# Patient Record
Sex: Female | Born: 1945 | Race: Black or African American | Hispanic: No | Smoking: Former smoker
Health system: Southern US, Community
[De-identification: ages and names within clinical notes are randomized; demographics above are authoritative.]

## PROBLEM LIST (undated history)

## (undated) DIAGNOSIS — J189 Pneumonia, unspecified organism: Secondary | ICD-10-CM

## (undated) DIAGNOSIS — N179 Acute kidney failure, unspecified: Secondary | ICD-10-CM

## (undated) DIAGNOSIS — M069 Rheumatoid arthritis, unspecified: Secondary | ICD-10-CM

## (undated) DIAGNOSIS — D649 Anemia, unspecified: Secondary | ICD-10-CM

## (undated) DIAGNOSIS — J309 Allergic rhinitis, unspecified: Secondary | ICD-10-CM

## (undated) DIAGNOSIS — K219 Gastro-esophageal reflux disease without esophagitis: Secondary | ICD-10-CM

## (undated) DIAGNOSIS — R079 Chest pain, unspecified: Secondary | ICD-10-CM

## (undated) DIAGNOSIS — I2510 Atherosclerotic heart disease of native coronary artery without angina pectoris: Secondary | ICD-10-CM

## (undated) DIAGNOSIS — E785 Hyperlipidemia, unspecified: Secondary | ICD-10-CM

## (undated) DIAGNOSIS — I10 Essential (primary) hypertension: Secondary | ICD-10-CM

## (undated) DIAGNOSIS — R0902 Hypoxemia: Secondary | ICD-10-CM

## (undated) HISTORY — DX: Hypoxemia: R09.02

## (undated) HISTORY — PX: BUNIONECTOMY: SHX129

## (undated) HISTORY — DX: Essential (primary) hypertension: I10

## (undated) HISTORY — DX: Rheumatoid arthritis, unspecified: M06.9

## (undated) HISTORY — DX: Anemia, unspecified: D64.9

## (undated) HISTORY — DX: Allergic rhinitis, unspecified: J30.9

## (undated) HISTORY — DX: Hyperlipidemia, unspecified: E78.5

## (undated) HISTORY — DX: Chest pain, unspecified: R07.9

## (undated) HISTORY — DX: Acute kidney failure, unspecified: N17.9

## (undated) HISTORY — DX: Atherosclerotic heart disease of native coronary artery without angina pectoris: I25.10

## (undated) HISTORY — DX: Pneumonia, unspecified organism: J18.9

---

## 1983-02-20 HISTORY — PX: ABDOMINAL HYSTERECTOMY: SHX81

## 1997-08-05 ENCOUNTER — Other Ambulatory Visit: Admission: RE | Admit: 1997-08-05 | Discharge: 1997-08-05 | Payer: Self-pay | Admitting: Obstetrics and Gynecology

## 1998-06-22 ENCOUNTER — Encounter: Payer: Self-pay | Admitting: Family Medicine

## 1998-06-22 ENCOUNTER — Ambulatory Visit (HOSPITAL_COMMUNITY): Admission: RE | Admit: 1998-06-22 | Discharge: 1998-06-22 | Payer: Self-pay | Admitting: Family Medicine

## 1998-07-27 ENCOUNTER — Ambulatory Visit (HOSPITAL_COMMUNITY): Admission: RE | Admit: 1998-07-27 | Discharge: 1998-07-27 | Payer: Self-pay | Admitting: Gastroenterology

## 1998-07-27 ENCOUNTER — Encounter: Payer: Self-pay | Admitting: Gastroenterology

## 1998-08-15 ENCOUNTER — Other Ambulatory Visit: Admission: RE | Admit: 1998-08-15 | Discharge: 1998-08-15 | Payer: Self-pay | Admitting: Obstetrics and Gynecology

## 1999-09-01 ENCOUNTER — Encounter: Payer: Self-pay | Admitting: Family Medicine

## 1999-09-01 ENCOUNTER — Ambulatory Visit (HOSPITAL_COMMUNITY): Admission: RE | Admit: 1999-09-01 | Discharge: 1999-09-01 | Payer: Self-pay | Admitting: Family Medicine

## 1999-09-18 ENCOUNTER — Other Ambulatory Visit: Admission: RE | Admit: 1999-09-18 | Discharge: 1999-09-18 | Payer: Self-pay | Admitting: Obstetrics and Gynecology

## 1999-11-17 ENCOUNTER — Encounter: Payer: Self-pay | Admitting: Family Medicine

## 1999-11-17 ENCOUNTER — Encounter: Admission: RE | Admit: 1999-11-17 | Discharge: 1999-11-17 | Payer: Self-pay | Admitting: Family Medicine

## 2000-02-08 ENCOUNTER — Emergency Department (HOSPITAL_COMMUNITY): Admission: EM | Admit: 2000-02-08 | Discharge: 2000-02-08 | Payer: Self-pay | Admitting: Emergency Medicine

## 2000-03-25 ENCOUNTER — Ambulatory Visit (HOSPITAL_COMMUNITY): Admission: RE | Admit: 2000-03-25 | Discharge: 2000-03-25 | Payer: Self-pay | Admitting: *Deleted

## 2000-03-25 ENCOUNTER — Encounter: Payer: Self-pay | Admitting: *Deleted

## 2000-04-12 ENCOUNTER — Ambulatory Visit (HOSPITAL_COMMUNITY): Admission: RE | Admit: 2000-04-12 | Discharge: 2000-04-12 | Payer: Self-pay | Admitting: *Deleted

## 2000-10-07 ENCOUNTER — Other Ambulatory Visit: Admission: RE | Admit: 2000-10-07 | Discharge: 2000-10-07 | Payer: Self-pay | Admitting: Unknown Physician Specialty

## 2001-01-07 ENCOUNTER — Encounter: Admission: RE | Admit: 2001-01-07 | Discharge: 2001-03-04 | Payer: Self-pay | Admitting: Family Medicine

## 2002-12-11 ENCOUNTER — Encounter: Payer: Self-pay | Admitting: Family Medicine

## 2002-12-11 ENCOUNTER — Encounter: Admission: RE | Admit: 2002-12-11 | Discharge: 2002-12-11 | Payer: Self-pay | Admitting: Family Medicine

## 2002-12-15 ENCOUNTER — Encounter: Admission: RE | Admit: 2002-12-15 | Discharge: 2002-12-15 | Payer: Self-pay | Admitting: Family Medicine

## 2003-06-23 ENCOUNTER — Encounter: Admission: RE | Admit: 2003-06-23 | Discharge: 2003-06-23 | Payer: Self-pay | Admitting: Family Medicine

## 2003-12-19 ENCOUNTER — Emergency Department (HOSPITAL_COMMUNITY): Admission: EM | Admit: 2003-12-19 | Discharge: 2003-12-19 | Payer: Self-pay | Admitting: Emergency Medicine

## 2007-08-01 ENCOUNTER — Encounter: Admission: RE | Admit: 2007-08-01 | Discharge: 2007-08-01 | Payer: Self-pay | Admitting: Family Medicine

## 2009-02-22 ENCOUNTER — Encounter: Admission: RE | Admit: 2009-02-22 | Discharge: 2009-02-22 | Payer: Self-pay | Admitting: Family Medicine

## 2010-07-07 ENCOUNTER — Encounter: Payer: Self-pay | Admitting: Oncology

## 2010-07-26 ENCOUNTER — Other Ambulatory Visit: Payer: Self-pay | Admitting: Oncology

## 2010-07-26 ENCOUNTER — Encounter (HOSPITAL_BASED_OUTPATIENT_CLINIC_OR_DEPARTMENT_OTHER): Payer: Federal, State, Local not specified - PPO | Admitting: Oncology

## 2010-07-26 DIAGNOSIS — D649 Anemia, unspecified: Secondary | ICD-10-CM

## 2010-07-26 LAB — CBC & DIFF AND RETIC
BASO%: 0.4 % (ref 0.0–2.0)
Basophils Absolute: 0 10*3/uL (ref 0.0–0.1)
EOS%: 10.5 % — ABNORMAL HIGH (ref 0.0–7.0)
Eosinophils Absolute: 0.6 10*3/uL — ABNORMAL HIGH (ref 0.0–0.5)
HCT: 33.2 % — ABNORMAL LOW (ref 34.8–46.6)
HGB: 10.6 g/dL — ABNORMAL LOW (ref 11.6–15.9)
Immature Retic Fract: 3.9 % (ref 0.00–10.70)
LYMPH%: 29.3 % (ref 14.0–49.7)
MCH: 26.2 pg (ref 25.1–34.0)
MCHC: 31.9 g/dL (ref 31.5–36.0)
MCV: 82 fL (ref 79.5–101.0)
MONO#: 0.4 10*3/uL (ref 0.1–0.9)
MONO%: 6.9 % (ref 0.0–14.0)
NEUT#: 3 10*3/uL (ref 1.5–6.5)
NEUT%: 52.9 % (ref 38.4–76.8)
Platelets: 384 10*3/uL (ref 145–400)
RBC: 4.05 10*6/uL (ref 3.70–5.45)
RDW: 14.6 % — ABNORMAL HIGH (ref 11.2–14.5)
Retic %: 1.25 % (ref 0.50–1.50)
Retic Ct Abs: 50.63 10*3/uL (ref 18.30–72.70)
WBC: 5.6 10*3/uL (ref 3.9–10.3)
lymph#: 1.7 10*3/uL (ref 0.9–3.3)

## 2010-07-26 LAB — MORPHOLOGY: PLT EST: ADEQUATE

## 2010-07-26 LAB — CHCC SMEAR

## 2010-07-28 LAB — PROTEIN ELECTROPHORESIS, SERUM
Albumin ELP: 57.4 % (ref 55.8–66.1)
Alpha-1-Globulin: 5.4 % — ABNORMAL HIGH (ref 2.9–4.9)
Alpha-2-Globulin: 12.3 % — ABNORMAL HIGH (ref 7.1–11.8)
Beta 2: 5.2 % (ref 3.2–6.5)
Beta Globulin: 5.8 % (ref 4.7–7.2)
Gamma Globulin: 13.9 % (ref 11.1–18.8)
Total Protein, Serum Electrophoresis: 6.8 g/dL (ref 6.0–8.3)

## 2010-07-28 LAB — COMPREHENSIVE METABOLIC PANEL
ALT: <8 U/L (ref 0–35)
AST: 18 U/L (ref 0–37)
Albumin: 4.2 g/dL (ref 3.5–5.2)
Alkaline Phosphatase: 78 U/L (ref 39–117)
BUN: 13 mg/dL (ref 6–23)
CO2: 31 mEq/L (ref 19–32)
Calcium: 9.3 mg/dL (ref 8.4–10.5)
Chloride: 102 mEq/L (ref 96–112)
Creatinine, Ser: 0.9 mg/dL (ref 0.50–1.10)
Glucose, Bld: 67 mg/dL — ABNORMAL LOW (ref 70–99)
Potassium: 4 mEq/L (ref 3.5–5.3)
Sodium: 141 mEq/L (ref 135–145)
Total Bilirubin: 0.4 mg/dL (ref 0.3–1.2)
Total Protein: 6.8 g/dL (ref 6.0–8.3)

## 2010-07-28 LAB — KAPPA/LAMBDA LIGHT CHAINS
Kappa free light chain: 1.3 mg/dL (ref 0.33–1.94)
Kappa:Lambda Ratio: 0.79 (ref 0.26–1.65)
Lambda Free Lght Chn: 1.65 mg/dL (ref 0.57–2.63)

## 2010-07-28 LAB — LACTATE DEHYDROGENASE: LDH: 216 U/L (ref 94–250)

## 2010-07-28 LAB — ERYTHROPOIETIN: Erythropoietin: 27.1 m[IU]/mL (ref 2.6–34.0)

## 2010-07-28 LAB — VITAMIN B12: Vitamin B-12: 346 pg/mL (ref 211–911)

## 2010-07-28 LAB — FOLATE: Folate: 5.2 ng/mL

## 2010-10-19 ENCOUNTER — Other Ambulatory Visit: Payer: Self-pay | Admitting: Oncology

## 2010-10-19 ENCOUNTER — Encounter (HOSPITAL_BASED_OUTPATIENT_CLINIC_OR_DEPARTMENT_OTHER): Payer: Federal, State, Local not specified - PPO | Admitting: Oncology

## 2010-10-19 DIAGNOSIS — D649 Anemia, unspecified: Secondary | ICD-10-CM

## 2010-10-19 LAB — CBC WITH DIFFERENTIAL/PLATELET
BASO%: 0 % (ref 0.0–2.0)
Basophils Absolute: 0 10*3/uL (ref 0.0–0.1)
EOS%: 10.4 % — ABNORMAL HIGH (ref 0.0–7.0)
Eosinophils Absolute: 0.6 10*3/uL — ABNORMAL HIGH (ref 0.0–0.5)
HCT: 32.5 % — ABNORMAL LOW (ref 34.8–46.6)
HGB: 10.4 g/dL — ABNORMAL LOW (ref 11.6–15.9)
LYMPH%: 33.3 % (ref 14.0–49.7)
MCH: 27 pg (ref 25.1–34.0)
MCHC: 32 g/dL (ref 31.5–36.0)
MCV: 84.4 fL (ref 79.5–101.0)
MONO#: 0.5 10*3/uL (ref 0.1–0.9)
MONO%: 9.6 % (ref 0.0–14.0)
NEUT#: 2.7 10*3/uL (ref 1.5–6.5)
NEUT%: 46.7 % (ref 38.4–76.8)
Platelets: 374 10*3/uL (ref 145–400)
RBC: 3.85 10*6/uL (ref 3.70–5.45)
RDW: 16.3 % — ABNORMAL HIGH (ref 11.2–14.5)
WBC: 5.7 10*3/uL (ref 3.9–10.3)
lymph#: 1.9 10*3/uL (ref 0.9–3.3)

## 2011-01-14 ENCOUNTER — Encounter: Payer: Self-pay | Admitting: Oncology

## 2011-01-14 DIAGNOSIS — E785 Hyperlipidemia, unspecified: Secondary | ICD-10-CM | POA: Insufficient documentation

## 2011-01-14 DIAGNOSIS — M069 Rheumatoid arthritis, unspecified: Secondary | ICD-10-CM | POA: Insufficient documentation

## 2011-01-14 DIAGNOSIS — I10 Essential (primary) hypertension: Secondary | ICD-10-CM | POA: Insufficient documentation

## 2011-01-14 DIAGNOSIS — D649 Anemia, unspecified: Secondary | ICD-10-CM | POA: Insufficient documentation

## 2011-01-15 ENCOUNTER — Encounter: Payer: Self-pay | Admitting: *Deleted

## 2011-01-18 ENCOUNTER — Ambulatory Visit (HOSPITAL_BASED_OUTPATIENT_CLINIC_OR_DEPARTMENT_OTHER): Payer: Federal, State, Local not specified - PPO | Admitting: Oncology

## 2011-01-18 ENCOUNTER — Other Ambulatory Visit (HOSPITAL_BASED_OUTPATIENT_CLINIC_OR_DEPARTMENT_OTHER): Payer: Federal, State, Local not specified - PPO | Admitting: Lab

## 2011-01-18 ENCOUNTER — Other Ambulatory Visit: Payer: Self-pay | Admitting: Oncology

## 2011-01-18 ENCOUNTER — Telehealth: Payer: Self-pay | Admitting: Oncology

## 2011-01-18 DIAGNOSIS — D649 Anemia, unspecified: Secondary | ICD-10-CM

## 2011-01-18 DIAGNOSIS — I10 Essential (primary) hypertension: Secondary | ICD-10-CM

## 2011-01-18 DIAGNOSIS — K219 Gastro-esophageal reflux disease without esophagitis: Secondary | ICD-10-CM

## 2011-01-18 DIAGNOSIS — M069 Rheumatoid arthritis, unspecified: Secondary | ICD-10-CM

## 2011-01-18 DIAGNOSIS — E785 Hyperlipidemia, unspecified: Secondary | ICD-10-CM

## 2011-01-18 LAB — IRON AND TIBC
%SAT: 24 % (ref 20–55)
Iron: 74 ug/dL (ref 42–145)
TIBC: 304 ug/dL (ref 250–470)
UIBC: 230 ug/dL (ref 125–400)

## 2011-01-18 LAB — CBC WITH DIFFERENTIAL/PLATELET
BASO%: 0.2 % (ref 0.0–2.0)
Basophils Absolute: 0 10*3/uL (ref 0.0–0.1)
EOS%: 10.9 % — ABNORMAL HIGH (ref 0.0–7.0)
Eosinophils Absolute: 0.6 10*3/uL — ABNORMAL HIGH (ref 0.0–0.5)
HCT: 33.4 % — ABNORMAL LOW (ref 34.8–46.6)
HGB: 10.6 g/dL — ABNORMAL LOW (ref 11.6–15.9)
LYMPH%: 34.7 % (ref 14.0–49.7)
MCH: 25.7 pg (ref 25.1–34.0)
MCHC: 31.7 g/dL (ref 31.5–36.0)
MCV: 80.9 fL (ref 79.5–101.0)
MONO#: 0.5 10*3/uL (ref 0.1–0.9)
MONO%: 9.8 % (ref 0.0–14.0)
NEUT#: 2.4 10*3/uL (ref 1.5–6.5)
NEUT%: 44.4 % (ref 38.4–76.8)
Platelets: 374 10*3/uL (ref 145–400)
RBC: 4.13 10*6/uL (ref 3.70–5.45)
RDW: 15.2 % — ABNORMAL HIGH (ref 11.2–14.5)
WBC: 5.3 10*3/uL (ref 3.9–10.3)
lymph#: 1.8 10*3/uL (ref 0.9–3.3)
nRBC: 0 % (ref 0–0)

## 2011-01-18 LAB — COMPREHENSIVE METABOLIC PANEL
ALT: <8 U/L (ref 0–35)
AST: 18 U/L (ref 0–37)
Albumin: 4.1 g/dL (ref 3.5–5.2)
Alkaline Phosphatase: 87 U/L (ref 39–117)
BUN: 11 mg/dL (ref 6–23)
CO2: 27 mEq/L (ref 19–32)
Calcium: 9.4 mg/dL (ref 8.4–10.5)
Chloride: 103 mEq/L (ref 96–112)
Creatinine, Ser: 0.97 mg/dL (ref 0.50–1.10)
Glucose, Bld: 86 mg/dL (ref 70–99)
Potassium: 4.4 mEq/L (ref 3.5–5.3)
Sodium: 140 mEq/L (ref 135–145)
Total Bilirubin: 0.3 mg/dL (ref 0.3–1.2)
Total Protein: 6.7 g/dL (ref 6.0–8.3)

## 2011-01-18 LAB — FERRITIN: Ferritin: 51 ng/mL (ref 10–291)

## 2011-01-18 NOTE — Telephone Encounter (Signed)
gve the pt her march,oct 2013 appt calendars

## 2011-01-18 NOTE — Progress Notes (Signed)
[facility] OFFICE PROGRESS NOTE  DIAGNOSIS:  Chronic normocytic anemia; most likely due to chronic inflammation from rheumatoid arthritis and leflunomide.  CURRENT THERAPY: watchful observation.  INTERVAL HISTORY: Cheyenne Gould 65 y.o. female returns for regular follow up.  She is still working full time as a Runner, broadcasting/film/video.  She notices that she has been having more frequent flare of RA where she has exacerbation about 1-2x/month now.  She is happy with leflunomide and does not want anything stronger.  She denies visible source of bleeding such as epistaxis, gum bleed, hematemasis, hemoptysis, melena, hematochezia, vaginal bleeding.  She denies fatigue, anorexia, weight loss, night sweat, node swelling.  Patient denies fatigue, headache, visual changes, confusion, drenching night sweats, palpable lymph node swelling, mucositis, odynophagia, dysphagia, nausea vomiting, jaundice, chest pain, palpitation, shortness of breath, dyspnea on exertion, productive cough, gum bleeding, epistaxis, hematemesis, hemoptysis, abdominal pain, abdominal swelling, early satiety, melena, hematochezia, hematuria, skin rash, spontaneous bleeding, heat or cold intolerance, bowel bladder incontinence, back pain, focal motor weakness, paresthesia, depression, suicidal or homocidal ideation, feeling hopelessness.   MEDICAL HISTORY: Past Medical History  Diagnosis Date  . Rheumatoid arthritis     on Leflunomide  . Hypertension   . Hyperlipidemia   . Allergic sinusitis   . Anemia     SURGICAL HISTORY: No past surgical history on file.  MEDICATIONS: Current Outpatient Prescriptions  Medication Sig Dispense Refill  . acetaminophen (TYLENOL) 500 MG tablet Take 500 mg by mouth every 6 (six) hours as needed.        Marland Kitchen amLODipine-valsartan (EXFORGE) 5-160 MG per tablet Take 1 tablet by mouth daily.        . clindamycin (CLEOCIN) 300 MG capsule 300 mg. Prior to dental work      . fexofenadine (ALLEGRA) 180 MG  tablet Take 180 mg by mouth daily as needed.        . leflunomide (ARAVA) 20 MG tablet Take 20 mg by mouth daily.        Marland Kitchen NEXIUM 40 MG capsule Take 40 mg by mouth 2 (two) times daily.       Marland Kitchen VITAMIN D, ERGOCALCIFEROL, PO Take by mouth.          ALLERGIES:  is allergic to peanut-containing drug products; penicillins; shellfish allergy; and sulfa antibiotics.  REVIEW OF SYSTEMS:  The rest of the 14-point review of system was negative.   Filed Vitals:   01/18/11 0901  BP: 128/79  Pulse: 75  Temp: 97.4 F (36.3 C)   Wt Readings from Last 3 Encounters:  01/18/11 168 lb 6.4 oz (76.386 kg)  07/26/10 162 lb 8 oz (73.71 kg)   ECOG Performance status: 0-1  PHYSICAL EXAMINATION:   General:  well-nourished in no acute distress.  Eyes:  no scleral icterus.  ENT:  There were no oropharyngeal lesions.  Neck was without thyromegaly.  Lymphatics:  Negative cervical, supraclavicular or axillary adenopathy.  Respiratory: lungs were clear bilaterally without wheezing or crackles.  Cardiovascular:  Regular rate and rhythm, S1/S2, without murmur, rub or gallop.  There was no pedal edema.  GI:  abdomen was soft, flat, nontender, nondistended, without organomegaly.  Muscoloskeletal: very mild ulnar deviation of bilateral hands without nodules or pain on palpation.   Skin exam was without echymosis, petichae.  Neuro exam was nonfocal.  Patient was able to get on and off exam table without assistance.  Gait was normal.  Patient was alerted and oriented.  Attention was good.   Language was  appropriate.  Mood was normal without depression.  Speech was not pressured.  Thought content was not tangential.    LABORATORY/RADIOLOGY DATA: WBC 5.3; Hgb 10.6; Plt 374.    ASSESSMENT AND PLAN:   1.  RA:  Slightly increase in frequency of exacerbation.  She is happy with leflunomide.  2.  Normocytic anemia:  Most likely due to her RA and leflunomide.  Her Hbg is stable.  There is no active bleeding or symptomatic  anemia.  There is no need for transfusion.  Since her anemia is very mild, no invasive testing is indicated such as bone marrow biopsy unless her Hgb significantly worsens in the future.  3.  GERD:  Controlled with Nexium.  She has had negative EGD with Dr. Elnoria Howard.  4.  HTN:  Well controlled on Amlodipine/Valsartan.  5.  Follow up:  CBC in 4 months; lab/RV with me in 8 months. 6.  Age-appropriate cancer screening:  Her colonoscopy and mammogram are due about now.  She prefers to [facility] Dr[facility] for [facility].

## 2011-01-25 ENCOUNTER — Other Ambulatory Visit: Payer: Federal, State, Local not specified - PPO | Admitting: Lab

## 2011-05-18 ENCOUNTER — Other Ambulatory Visit (HOSPITAL_BASED_OUTPATIENT_CLINIC_OR_DEPARTMENT_OTHER): Payer: Federal, State, Local not specified - PPO | Admitting: Lab

## 2011-05-18 DIAGNOSIS — D649 Anemia, unspecified: Secondary | ICD-10-CM

## 2011-05-18 LAB — CBC WITH DIFFERENTIAL/PLATELET
BASO%: 0.8 % (ref 0.0–2.0)
Basophils Absolute: 0.1 10*3/uL (ref 0.0–0.1)
EOS%: 6.2 % (ref 0.0–7.0)
Eosinophils Absolute: 0.5 10*3/uL (ref 0.0–0.5)
HCT: 32.8 % — ABNORMAL LOW (ref 34.8–46.6)
HGB: 10.5 g/dL — ABNORMAL LOW (ref 11.6–15.9)
LYMPH%: 16.7 % (ref 14.0–49.7)
MCH: 27.4 pg (ref 25.1–34.0)
MCHC: 32.2 g/dL (ref 31.5–36.0)
MCV: 85.1 fL (ref 79.5–101.0)
MONO#: 0.7 10*3/uL (ref 0.1–0.9)
MONO%: 9.1 % (ref 0.0–14.0)
NEUT#: 5.5 10*3/uL (ref 1.5–6.5)
NEUT%: 67.2 % (ref 38.4–76.8)
Platelets: 343 10*3/uL (ref 145–400)
RBC: 3.85 10*6/uL (ref 3.70–5.45)
RDW: 15.7 % — ABNORMAL HIGH (ref 11.2–14.5)
WBC: 8.2 10*3/uL (ref 3.9–10.3)
lymph#: 1.4 10*3/uL (ref 0.9–3.3)
nRBC: 0 % (ref 0–0)

## 2011-05-22 ENCOUNTER — Telehealth: Payer: Self-pay

## 2011-05-22 NOTE — Telephone Encounter (Signed)
Message copied by Kallie Locks on Tue May 22, 2011  3:53 PM ------      Message from: Jethro Bolus T      Created: Fri May 18, 2011  9:47 AM       Please call pt.  Her hgb has been stable.  Continue observation.  Thanks.

## 2011-07-26 ENCOUNTER — Ambulatory Visit: Payer: Federal, State, Local not specified - PPO | Admitting: Oncology

## 2011-07-26 ENCOUNTER — Other Ambulatory Visit: Payer: Federal, State, Local not specified - PPO | Admitting: Lab

## 2011-12-05 ENCOUNTER — Telehealth: Payer: Self-pay | Admitting: Oncology

## 2011-12-05 ENCOUNTER — Other Ambulatory Visit (HOSPITAL_BASED_OUTPATIENT_CLINIC_OR_DEPARTMENT_OTHER): Payer: Federal, State, Local not specified - PPO | Admitting: Lab

## 2011-12-05 ENCOUNTER — Ambulatory Visit (HOSPITAL_BASED_OUTPATIENT_CLINIC_OR_DEPARTMENT_OTHER): Payer: Federal, State, Local not specified - PPO | Admitting: Oncology

## 2011-12-05 ENCOUNTER — Ambulatory Visit: Payer: Federal, State, Local not specified - PPO | Admitting: Oncology

## 2011-12-05 ENCOUNTER — Encounter: Payer: Self-pay | Admitting: Oncology

## 2011-12-05 ENCOUNTER — Other Ambulatory Visit: Payer: Federal, State, Local not specified - PPO | Admitting: Lab

## 2011-12-05 VITALS — BP 126/79 | HR 82 | Temp 97.6°F | Resp 20 | Ht 65.5 in | Wt 167.9 lb

## 2011-12-05 DIAGNOSIS — M069 Rheumatoid arthritis, unspecified: Secondary | ICD-10-CM

## 2011-12-05 DIAGNOSIS — D649 Anemia, unspecified: Secondary | ICD-10-CM

## 2011-12-05 DIAGNOSIS — I10 Essential (primary) hypertension: Secondary | ICD-10-CM

## 2011-12-05 LAB — CBC WITH DIFFERENTIAL/PLATELET
BASO%: 0.7 % (ref 0.0–2.0)
Basophils Absolute: 0 10*3/uL (ref 0.0–0.1)
EOS%: 10.8 % — ABNORMAL HIGH (ref 0.0–7.0)
Eosinophils Absolute: 0.6 10*3/uL — ABNORMAL HIGH (ref 0.0–0.5)
HCT: 31.1 % — ABNORMAL LOW (ref 34.8–46.6)
HGB: 9.8 g/dL — ABNORMAL LOW (ref 11.6–15.9)
LYMPH%: 25.8 % (ref 14.0–49.7)
MCH: 26.5 pg (ref 25.1–34.0)
MCHC: 31.6 g/dL (ref 31.5–36.0)
MCV: 83.7 fL (ref 79.5–101.0)
MONO#: 0.5 10*3/uL (ref 0.1–0.9)
MONO%: 9 % (ref 0.0–14.0)
NEUT#: 3 10*3/uL (ref 1.5–6.5)
NEUT%: 53.7 % (ref 38.4–76.8)
Platelets: 324 10*3/uL (ref 145–400)
RBC: 3.72 10*6/uL (ref 3.70–5.45)
RDW: 16 % — ABNORMAL HIGH (ref 11.2–14.5)
WBC: 5.5 10*3/uL (ref 3.9–10.3)
lymph#: 1.4 10*3/uL (ref 0.9–3.3)

## 2011-12-05 LAB — COMPREHENSIVE METABOLIC PANEL (CC13)
ALT: 7 U/L (ref 0–55)
AST: 18 U/L (ref 5–34)
Albumin: 3.4 g/dL — ABNORMAL LOW (ref 3.5–5.0)
Alkaline Phosphatase: 88 U/L (ref 40–150)
BUN: 10 mg/dL (ref 7.0–26.0)
CO2: 26 mEq/L (ref 22–29)
Calcium: 9.6 mg/dL (ref 8.4–10.4)
Chloride: 104 mEq/L (ref 98–107)
Creatinine: 0.9 mg/dL (ref 0.6–1.1)
Glucose: 89 mg/dl (ref 70–99)
Potassium: 3.9 mEq/L (ref 3.5–5.1)
Sodium: 140 mEq/L (ref 136–145)
Total Bilirubin: 0.4 mg/dL (ref 0.20–1.20)
Total Protein: 6.4 g/dL (ref 6.4–8.3)

## 2011-12-05 LAB — IRON AND TIBC
%SAT: 17 % — ABNORMAL LOW (ref 20–55)
Iron: 45 ug/dL (ref 42–145)
TIBC: 271 ug/dL (ref 250–470)
UIBC: 226 ug/dL (ref 125–400)

## 2011-12-05 LAB — FERRITIN: Ferritin: 54 ng/mL (ref 10–291)

## 2011-12-05 NOTE — Progress Notes (Signed)
[facility]  Telephone:([phone]) [phone] [phone]   OFFICE PROGRESS NOTE   Cc:  Geraldo Pitter, MD  DIAGNOSIS: Chronic normocytic anemia; most likely due to chronic inflammation from rheumatoid arthritis and leflunomide.   CURRENT THERAPY: Watchful observation   INTERVAL HISTORY: Cheyenne Gould 66 y.o. female returns for regular follow up. She is still working full time as a Runner, broadcasting/film/video. She notices that she has been having more frequent flare of RA where she has exacerbation about 1-2x/month now. She is happy with leflunomide and does not want anything stronger. She denies visible source of bleeding such as epistaxis, gum bleed, hematemasis, hemoptysis, melena, hematochezia, vaginal bleeding. She denies fatigue, anorexia, weight loss, night sweat, node swelling.   Patient denies fatigue, headache, visual changes, confusion, drenching night sweats, palpable lymph node swelling, mucositis, odynophagia, dysphagia, nausea vomiting, jaundice, chest pain, palpitation, shortness of breath, dyspnea on exertion, productive cough, gum bleeding, epistaxis, hematemesis, hemoptysis, abdominal pain, abdominal swelling, early satiety, melena, hematochezia, hematuria, skin rash, spontaneous bleeding, heat or cold intolerance, bowel bladder incontinence, back pain, focal motor weakness, paresthesia, depression, suicidal or homocidal ideation, feeling hopelessness.   Past Medical History  Diagnosis Date  . Rheumatoid arthritis     on Leflunomide  . Hypertension   . Hyperlipidemia   . Allergic sinusitis   . Anemia     History reviewed. No pertinent past surgical history.  Current Outpatient Prescriptions  Medication Sig Dispense Refill  . acetaminophen (TYLENOL) 500 MG tablet Take 500 mg by mouth every 6 (six) hours as needed.        . clindamycin (CLEOCIN) 300 MG capsule 300 mg. Prior to dental work      . fexofenadine (ALLEGRA) 180 MG tablet Take 180 mg by mouth daily as  needed.        . leflunomide (ARAVA) 20 MG tablet Take 20 mg by mouth daily.        Marland Kitchen NEXIUM 40 MG capsule Take 40 mg by mouth 2 (two) times daily.       Marland Kitchen VITAMIN D, ERGOCALCIFEROL, PO Take by mouth.        Carlene Coria HCT 5-160-12.5 MG TABS         ALLERGIES:  is allergic to peanut-containing drug products; penicillins; shellfish allergy; and sulfa antibiotics.  REVIEW OF SYSTEMS:  The rest of the 14-point review of system was negative.   Filed Vitals:   12/05/11 0857  BP: 126/79  Pulse: 82  Temp: 97.6 F (36.4 C)  Resp: 20   Wt Readings from Last 3 Encounters:  12/05/11 167 lb 14.4 oz (76.159 kg)  01/18/11 168 lb 6.4 oz (76.386 kg)  07/26/10 162 lb 8 oz (73.71 kg)   ECOG Performance status: 0-1  PHYSICAL EXAMINATION:   General: well-nourished in no acute distress. Eyes: no scleral icterus. ENT: There were no oropharyngeal lesions. Neck was without thyromegaly. Lymphatics: Negative cervical, supraclavicular or axillary adenopathy. Respiratory: lungs were clear bilaterally without wheezing or crackles. Cardiovascular: Regular rate and rhythm, S1/S2, without murmur, rub or gallop. There was no pedal edema. GI: abdomen was soft, flat, nontender, nondistended, without organomegaly. Muscoloskeletal: very mild ulnar deviation of bilateral hands without nodules or pain on palpation. Skin exam was without echymosis, petichae. Neuro exam was nonfocal. Patient was able to get on and off exam table without assistance. Gait was normal. Patient was alerted and oriented. Attention was good. Language was appropriate. Mood was normal without depression. Speech was not pressured. Thought content was  not tangential.   LABORATORY/RADIOLOGY DATA:  Lab Results  Component Value Date   WBC 5.5 12/05/2011   HGB 9.8* 12/05/2011   HCT 31.1* 12/05/2011   PLT 324 12/05/2011   GLUCOSE 89 12/05/2011   ALKPHOS 88 12/05/2011   ALT 7 12/05/2011   AST 18 12/05/2011   NA 140 12/05/2011   K 3.9 12/05/2011    CL 104 12/05/2011   CREATININE 0.9 12/05/2011   BUN 10.0 12/05/2011   CO2 26 12/05/2011    ASSESSMENT AND PLAN:   1. RA: Slightly increase in frequency of exacerbation. She is happy with leflunomide. She will continue follow-up with Dr Dierdre Forth.  2. Normocytic anemia: Most likely due to her RA and leflunomide. Her Hbg is stable. There is no active bleeding or symptomatic anemia. There is no need for transfusion. Since her anemia is very mild, no invasive testing is indicated such as bone marrow biopsy unless her Hgb significantly worsens in the future.   3. GERD: Controlled with Nexium. She has had negative EGD with Dr. Elnoria Howard.   4. HTN: Well controlled on Amlodipine/Valsartan.   5. Follow up: CBC in 4 and 8 months. Lab and visit in 1 year.   6. Age-appropriate cancer screening: Flu vaccine is scheduled for 12/14/11. Mammogram has been done within the past. Year. She still needs follow-up with Dr Elnoria Howard for a colonoscopy. She prefers to call his office and schedule with him.     The length of time of the face-to-face encounter was 15 minutes. More than 50% of time was spent counseling and coordination of care.

## 2011-12-05 NOTE — Telephone Encounter (Signed)
s.w. pt and advised on Feb, June, and OCT appts

## 2011-12-26 ENCOUNTER — Other Ambulatory Visit: Payer: Federal, State, Local not specified - PPO | Admitting: Lab

## 2012-02-25 DIAGNOSIS — J3489 Other specified disorders of nose and nasal sinuses: Secondary | ICD-10-CM | POA: Diagnosis not present

## 2012-02-25 DIAGNOSIS — J329 Chronic sinusitis, unspecified: Secondary | ICD-10-CM | POA: Diagnosis not present

## 2012-02-26 ENCOUNTER — Other Ambulatory Visit: Payer: Self-pay | Admitting: Otolaryngology

## 2012-02-26 DIAGNOSIS — J329 Chronic sinusitis, unspecified: Secondary | ICD-10-CM

## 2012-02-26 DIAGNOSIS — J3489 Other specified disorders of nose and nasal sinuses: Secondary | ICD-10-CM

## 2012-02-28 ENCOUNTER — Other Ambulatory Visit: Payer: Federal, State, Local not specified - PPO

## 2012-03-11 DIAGNOSIS — I10 Essential (primary) hypertension: Secondary | ICD-10-CM | POA: Diagnosis not present

## 2012-03-17 DIAGNOSIS — I10 Essential (primary) hypertension: Secondary | ICD-10-CM | POA: Diagnosis not present

## 2012-03-17 DIAGNOSIS — J069 Acute upper respiratory infection, unspecified: Secondary | ICD-10-CM | POA: Diagnosis not present

## 2012-03-19 ENCOUNTER — Ambulatory Visit
Admission: RE | Admit: 2012-03-19 | Discharge: 2012-03-19 | Disposition: A | Discharge status: Home / Self Care | Payer: Federal, State, Local not specified - PPO | Source: Ambulatory Visit | Attending: Otolaryngology | Admitting: Otolaryngology

## 2012-03-19 DIAGNOSIS — J3489 Other specified disorders of nose and nasal sinuses: Secondary | ICD-10-CM

## 2012-03-19 DIAGNOSIS — J329 Chronic sinusitis, unspecified: Secondary | ICD-10-CM

## 2012-03-27 ENCOUNTER — Other Ambulatory Visit: Payer: Federal, State, Local not specified - PPO | Admitting: Lab

## 2012-03-27 DIAGNOSIS — J329 Chronic sinusitis, unspecified: Secondary | ICD-10-CM | POA: Diagnosis not present

## 2012-03-27 DIAGNOSIS — H6120 Impacted cerumen, unspecified ear: Secondary | ICD-10-CM | POA: Diagnosis not present

## 2012-03-27 DIAGNOSIS — J3489 Other specified disorders of nose and nasal sinuses: Secondary | ICD-10-CM | POA: Diagnosis not present

## 2012-04-14 DIAGNOSIS — R1013 Epigastric pain: Secondary | ICD-10-CM | POA: Diagnosis not present

## 2012-04-14 DIAGNOSIS — R112 Nausea with vomiting, unspecified: Secondary | ICD-10-CM | POA: Diagnosis not present

## 2012-04-14 DIAGNOSIS — Z1211 Encounter for screening for malignant neoplasm of colon: Secondary | ICD-10-CM | POA: Diagnosis not present

## 2012-04-22 DIAGNOSIS — R1013 Epigastric pain: Secondary | ICD-10-CM | POA: Diagnosis not present

## 2012-04-22 DIAGNOSIS — Z1211 Encounter for screening for malignant neoplasm of colon: Secondary | ICD-10-CM | POA: Diagnosis not present

## 2012-04-22 DIAGNOSIS — K5730 Diverticulosis of large intestine without perforation or abscess without bleeding: Secondary | ICD-10-CM | POA: Diagnosis not present

## 2012-04-22 DIAGNOSIS — K649 Unspecified hemorrhoids: Secondary | ICD-10-CM | POA: Diagnosis not present

## 2012-04-22 DIAGNOSIS — K259 Gastric ulcer, unspecified as acute or chronic, without hemorrhage or perforation: Secondary | ICD-10-CM | POA: Diagnosis not present

## 2012-04-23 DIAGNOSIS — J343 Hypertrophy of nasal turbinates: Secondary | ICD-10-CM | POA: Diagnosis not present

## 2012-04-23 DIAGNOSIS — J3489 Other specified disorders of nose and nasal sinuses: Secondary | ICD-10-CM | POA: Diagnosis not present

## 2012-04-23 DIAGNOSIS — R059 Cough, unspecified: Secondary | ICD-10-CM | POA: Diagnosis not present

## 2012-04-23 DIAGNOSIS — J329 Chronic sinusitis, unspecified: Secondary | ICD-10-CM | POA: Diagnosis not present

## 2012-04-29 ENCOUNTER — Ambulatory Visit
Admission: RE | Admit: 2012-04-29 | Discharge: 2012-04-29 | Disposition: A | Discharge status: Home / Self Care | Payer: Medicare Other | Source: Ambulatory Visit | Attending: Otolaryngology | Admitting: Otolaryngology

## 2012-04-29 ENCOUNTER — Other Ambulatory Visit: Payer: Self-pay | Admitting: Otolaryngology

## 2012-04-29 DIAGNOSIS — R059 Cough, unspecified: Secondary | ICD-10-CM

## 2012-04-29 DIAGNOSIS — I10 Essential (primary) hypertension: Secondary | ICD-10-CM | POA: Diagnosis not present

## 2012-04-29 DIAGNOSIS — M069 Rheumatoid arthritis, unspecified: Secondary | ICD-10-CM | POA: Diagnosis not present

## 2012-04-29 DIAGNOSIS — E119 Type 2 diabetes mellitus without complications: Secondary | ICD-10-CM | POA: Diagnosis not present

## 2012-04-29 DIAGNOSIS — E7800 Pure hypercholesterolemia, unspecified: Secondary | ICD-10-CM | POA: Diagnosis not present

## 2012-04-30 DIAGNOSIS — M069 Rheumatoid arthritis, unspecified: Secondary | ICD-10-CM | POA: Diagnosis not present

## 2012-04-30 DIAGNOSIS — M25559 Pain in unspecified hip: Secondary | ICD-10-CM | POA: Diagnosis not present

## 2012-04-30 DIAGNOSIS — M76899 Other specified enthesopathies of unspecified lower limb, excluding foot: Secondary | ICD-10-CM | POA: Diagnosis not present

## 2012-05-06 ENCOUNTER — Encounter (INDEPENDENT_AMBULATORY_CARE_PROVIDER_SITE_OTHER): Payer: Self-pay | Admitting: Surgery

## 2012-05-06 ENCOUNTER — Encounter (INDEPENDENT_AMBULATORY_CARE_PROVIDER_SITE_OTHER): Payer: Self-pay

## 2012-05-07 ENCOUNTER — Encounter (INDEPENDENT_AMBULATORY_CARE_PROVIDER_SITE_OTHER): Payer: Self-pay

## 2012-05-12 ENCOUNTER — Encounter (INDEPENDENT_AMBULATORY_CARE_PROVIDER_SITE_OTHER): Payer: Self-pay | Admitting: Surgery

## 2012-05-12 ENCOUNTER — Ambulatory Visit (INDEPENDENT_AMBULATORY_CARE_PROVIDER_SITE_OTHER): Payer: Medicare Other | Admitting: Surgery

## 2012-05-12 VITALS — BP 130/72 | HR 120 | Temp 99.3°F | Resp 20 | Ht 66.0 in | Wt 164.8 lb

## 2012-05-12 DIAGNOSIS — K259 Gastric ulcer, unspecified as acute or chronic, without hemorrhage or perforation: Secondary | ICD-10-CM | POA: Diagnosis not present

## 2012-05-12 NOTE — Progress Notes (Signed)
Patient ID: Cheyenne Gould, female   DOB: 1946-01-23, 67 y.o.   MRN: [id number]  Chief Complaint  Patient presents with  . New Evaluation    eval chronic stomach ulcer    HPI Cheyenne Gould is a 67 y.o. female.   HPI This is a very pleasant lady who is referred to me by Dr. Jeani Hawking for evaluation of a persistent gastric ulcer.She is well known to Dr. Elnoria Howard. He has done multiple endoscopies on her in the past. Most recently he did an endoscopy earlier this month which showed persistence of a large ulcer in her gastric antrum. It has been biopsied in the past and has been benign. She reports that she most recently flared up with epigastric discomfort and burning after a long-term course of antibiotics. She also reports that she has not been taking her Nexium like she is supposed to. She sometimes goes weeks without taking her medication. Currently, she is symptom-free. She denies any heartburn or abdominal pain. Bowel movements are normal. Past Medical History  Diagnosis Date  . Rheumatoid arthritis     on Leflunomide  . Hypertension   . Hyperlipidemia   . Allergic sinusitis   . Anemia     Past Surgical History  Procedure Laterality Date  . Bunionectomy  2002,12/18/2006  . Abdominal hysterectomy  1985    Partial    Family History  Problem Relation Age of Onset  . Heart disease Father   . Hypertension Sister   . Lupus Brother   . Cancer Brother     Colon    Social History History  Substance Use Topics  . Smoking status: Former Smoker    Types: Cigarettes    Quit date: 02/20/2003  . Smokeless tobacco: Never Used  . Alcohol Use: No    Allergies  Allergen Reactions  . Peanut-Containing Drug Products Anaphylaxis  . Penicillins   . Shellfish Allergy Nausea And Vomiting  . Sulfa Antibiotics     Current Outpatient Prescriptions  Medication Sig Dispense Refill  . acetaminophen (TYLENOL) 500 MG tablet Take 500 mg by mouth every 6 (six) hours as needed.        .  clindamycin (CLEOCIN) 300 MG capsule 300 mg. Prior to dental work      . EXFORGE HCT 5-160-12.5 MG TABS       . fexofenadine (ALLEGRA) 180 MG tablet Take 180 mg by mouth daily as needed.        . fluticasone (FLONASE) 50 MCG/ACT nasal spray       . leflunomide (ARAVA) 20 MG tablet Take 20 mg by mouth daily.        Marland Kitchen NEXIUM 40 MG capsule Take 40 mg by mouth 2 (two) times daily.       Marland Kitchen VITAMIN D, ERGOCALCIFEROL, PO Take by mouth.        Marland Kitchen ipratropium (ATROVENT) 0.03 % nasal spray        No current facility-administered medications for this visit.    Review of Systems Review of Systems  Constitutional: Negative for fever, chills and unexpected weight change.  HENT: Negative for hearing loss, congestion, sore throat, trouble swallowing and voice change.   Eyes: Negative for visual disturbance.  Respiratory: Negative for cough and wheezing.   Cardiovascular: Negative for chest pain, palpitations and leg swelling.  Gastrointestinal: Negative for nausea, vomiting, abdominal pain, diarrhea, constipation, blood in stool, abdominal distention and anal bleeding.  Genitourinary: Negative for hematuria, vaginal bleeding and difficulty urinating.  Musculoskeletal:  Negative for arthralgias.  Skin: Negative for rash and wound.  Neurological: Negative for seizures, syncope and headaches.  Hematological: Negative for adenopathy. Does not bruise/bleed easily.  Psychiatric/Behavioral: Negative for confusion.    Blood pressure 130/72, pulse 120, temperature 99.3 F (37.4 C), temperature source Temporal, resp. rate 20, height 5\' 6"  (1.676 m), weight 164 lb 12.8 oz (74.753 kg).  Physical Exam Physical Exam  Constitutional: She is oriented to person, place, and time. She appears well-developed and well-nourished. No distress.  HENT:  Head: Normocephalic and atraumatic.  Right Ear: External ear normal.  Left Ear: External ear normal.  Nose: Nose normal.  Mouth/Throat: Oropharynx is clear and moist.   Eyes: Conjunctivae are normal. Pupils are equal, round, and reactive to light. Right eye exhibits no discharge. Left eye exhibits no discharge. No scleral icterus.  Neck: Normal range of motion. Neck supple. No tracheal deviation present. No thyromegaly present.  Cardiovascular: Normal rate, regular rhythm and intact distal pulses.   Murmur heard. Pulmonary/Chest: Effort normal and breath sounds normal. No respiratory distress. She has no wheezes. She has no rales.  Abdominal: Soft. Bowel sounds are normal. She exhibits no distension. There is no tenderness. There is no rebound.  Musculoskeletal: Normal range of motion. She exhibits no edema and no tenderness.  Lymphadenopathy:    She has no cervical adenopathy.  Neurological: She is alert and oriented to person, place, and time.  Skin: Skin is warm and dry. No rash noted. She is not diaphoretic. No erythema.  Psychiatric: Her behavior is normal. Judgment normal.    Data Reviewed I have reviewed all the notes from Dr. Elnoria Howard including his endoscopy reports. The most recent endoscopy on March 6 showed a large antral ulcer which was biopsied. This was similar on his previous endoscopies throughout the years  Assessment    Chronic gastric ulcer     Plan    Because of the persistence, surgical excision was recommended. I discussed this with her in detail. I explained to her that this was a major surgery necessitating a partial gastrectomy and vagotomy. She is very reluctant to proceed with surgery. She will also continue her medications which she will try to take much more regular basis. Again, I have my doubts that this will heal. Despite my explanation and the risks Of conservative treatment which include perforation and bleeding, She still refuses surgery at this time. I will see her back in one month. I encouraged her to call me as soon as possible should she develop any abdominal pain or melena.  If she continues to refuse surgery, she  probably needs a repeat endoscopy to see if this is healing at all.       Cheyenne Gould A 05/12/2012, 3:22 PM

## 2012-05-19 DIAGNOSIS — J343 Hypertrophy of nasal turbinates: Secondary | ICD-10-CM | POA: Diagnosis not present

## 2012-05-19 DIAGNOSIS — J329 Chronic sinusitis, unspecified: Secondary | ICD-10-CM | POA: Diagnosis not present

## 2012-05-19 DIAGNOSIS — R05 Cough: Secondary | ICD-10-CM | POA: Diagnosis not present

## 2012-05-19 DIAGNOSIS — J301 Allergic rhinitis due to pollen: Secondary | ICD-10-CM | POA: Diagnosis not present

## 2012-05-19 DIAGNOSIS — R059 Cough, unspecified: Secondary | ICD-10-CM | POA: Diagnosis not present

## 2012-05-19 DIAGNOSIS — J3489 Other specified disorders of nose and nasal sinuses: Secondary | ICD-10-CM | POA: Diagnosis not present

## 2012-05-19 DIAGNOSIS — J321 Chronic frontal sinusitis: Secondary | ICD-10-CM | POA: Diagnosis not present

## 2012-05-19 DIAGNOSIS — J322 Chronic ethmoidal sinusitis: Secondary | ICD-10-CM | POA: Diagnosis not present

## 2012-06-10 ENCOUNTER — Encounter (INDEPENDENT_AMBULATORY_CARE_PROVIDER_SITE_OTHER): Payer: Self-pay | Admitting: Surgery

## 2012-06-10 ENCOUNTER — Ambulatory Visit (INDEPENDENT_AMBULATORY_CARE_PROVIDER_SITE_OTHER): Payer: Medicare Other | Admitting: Surgery

## 2012-06-10 VITALS — BP 136/78 | HR 84 | Temp 96.8°F | Ht 66.0 in | Wt 172.8 lb

## 2012-06-10 DIAGNOSIS — K259 Gastric ulcer, unspecified as acute or chronic, without hemorrhage or perforation: Secondary | ICD-10-CM

## 2012-06-10 NOTE — Progress Notes (Signed)
Subjective:     Patient ID: Cheyenne Gould, female   DOB: 21-Dec-1945, 67 y.o.   MRN: [id number]  HPI She is here for a followup of her gastric ulcer which is chronic. Since I have seen her, she has been taking her medication regularly and has not missed any dosages. She reports that she has had no abdominal pain, nausea, or vomiting. She is eating well and is without complaints  Review of Systems     Objective:   Physical Exam On exam, her abdomen is soft and nontender    Assessment:     Chronic gastric ulcer     Plan:     As she is asymptomatic, she was to continue to hold on surgery. I believe this is currently reasonable. I will see her back in 2 months for recheck. I encouraged her to follow up with Dr. Elnoria Howard for repeat endoscopy

## 2012-06-26 DIAGNOSIS — M5430 Sciatica, unspecified side: Secondary | ICD-10-CM | POA: Diagnosis not present

## 2012-06-26 DIAGNOSIS — M76899 Other specified enthesopathies of unspecified lower limb, excluding foot: Secondary | ICD-10-CM | POA: Diagnosis not present

## 2012-06-26 DIAGNOSIS — M069 Rheumatoid arthritis, unspecified: Secondary | ICD-10-CM | POA: Diagnosis not present

## 2012-07-18 DIAGNOSIS — IMO0002 Reserved for concepts with insufficient information to code with codable children: Secondary | ICD-10-CM | POA: Diagnosis not present

## 2012-07-18 DIAGNOSIS — M5430 Sciatica, unspecified side: Secondary | ICD-10-CM | POA: Diagnosis not present

## 2012-07-18 DIAGNOSIS — M76899 Other specified enthesopathies of unspecified lower limb, excluding foot: Secondary | ICD-10-CM | POA: Diagnosis not present

## 2012-07-18 DIAGNOSIS — M069 Rheumatoid arthritis, unspecified: Secondary | ICD-10-CM | POA: Diagnosis not present

## 2012-07-24 ENCOUNTER — Other Ambulatory Visit (HOSPITAL_BASED_OUTPATIENT_CLINIC_OR_DEPARTMENT_OTHER): Payer: Federal, State, Local not specified - PPO | Admitting: Lab

## 2012-07-24 ENCOUNTER — Telehealth: Payer: Self-pay | Admitting: *Deleted

## 2012-07-24 DIAGNOSIS — D649 Anemia, unspecified: Secondary | ICD-10-CM

## 2012-07-24 LAB — CBC WITH DIFFERENTIAL/PLATELET
BASO%: 0.7 % (ref 0.0–2.0)
Basophils Absolute: 0 10*3/uL (ref 0.0–0.1)
EOS%: 10 % — ABNORMAL HIGH (ref 0.0–7.0)
Eosinophils Absolute: 0.6 10*3/uL — ABNORMAL HIGH (ref 0.0–0.5)
HCT: 30.8 % — ABNORMAL LOW (ref 34.8–46.6)
HGB: 9.6 g/dL — ABNORMAL LOW (ref 11.6–15.9)
LYMPH%: 33.3 % (ref 14.0–49.7)
MCH: 25.8 pg (ref 25.1–34.0)
MCHC: 31.3 g/dL — ABNORMAL LOW (ref 31.5–36.0)
MCV: 82.4 fL (ref 79.5–101.0)
MONO#: 0.7 10*3/uL (ref 0.1–0.9)
MONO%: 11.9 % (ref 0.0–14.0)
NEUT#: 2.7 10*3/uL (ref 1.5–6.5)
NEUT%: 44.1 % (ref 38.4–76.8)
Platelets: 356 10*3/uL (ref 145–400)
RBC: 3.73 10*6/uL (ref 3.70–5.45)
RDW: 15.9 % — ABNORMAL HIGH (ref 11.2–14.5)
WBC: 6.2 10*3/uL (ref 3.9–10.3)
lymph#: 2.1 10*3/uL (ref 0.9–3.3)

## 2012-07-24 NOTE — Telephone Encounter (Signed)
Message copied by Wende Mott on Thu Jul 24, 2012 10:07 AM ------      Message from: Clenton Pare R      Created: Thu Jul 24, 2012  9:13 AM       Please call pt. Hgb is stable. Continue observation. ------

## 2012-07-24 NOTE — Telephone Encounter (Signed)
Informed pt of Kristin's message below, Hbg stable and to keep next appt in Oct as scheduled.  She verbalized understanding.

## 2012-07-25 DIAGNOSIS — M961 Postlaminectomy syndrome, not elsewhere classified: Secondary | ICD-10-CM | POA: Diagnosis not present

## 2012-07-25 DIAGNOSIS — G894 Chronic pain syndrome: Secondary | ICD-10-CM | POA: Diagnosis not present

## 2012-07-25 DIAGNOSIS — Z79899 Other long term (current) drug therapy: Secondary | ICD-10-CM | POA: Diagnosis not present

## 2012-07-25 DIAGNOSIS — M5137 Other intervertebral disc degeneration, lumbosacral region: Secondary | ICD-10-CM | POA: Diagnosis not present

## 2012-08-07 DIAGNOSIS — R209 Unspecified disturbances of skin sensation: Secondary | ICD-10-CM | POA: Diagnosis not present

## 2012-08-07 DIAGNOSIS — IMO0002 Reserved for concepts with insufficient information to code with codable children: Secondary | ICD-10-CM | POA: Diagnosis not present

## 2012-08-11 ENCOUNTER — Ambulatory Visit (INDEPENDENT_AMBULATORY_CARE_PROVIDER_SITE_OTHER): Payer: Medicare Other | Admitting: Surgery

## 2012-08-11 ENCOUNTER — Encounter (INDEPENDENT_AMBULATORY_CARE_PROVIDER_SITE_OTHER): Payer: Self-pay | Admitting: Surgery

## 2012-08-11 VITALS — BP 118/62 | HR 100 | Resp 16 | Ht 66.0 in | Wt 184.6 lb

## 2012-08-11 DIAGNOSIS — K257 Chronic gastric ulcer without hemorrhage or perforation: Secondary | ICD-10-CM

## 2012-08-11 NOTE — Progress Notes (Signed)
Subjective:     Patient ID: Cheyenne Gould, female   DOB: [birth year], 67 y.o.   MRN: [id number]  HPI  She is here for another followup regarding her gastric ulcer. She reports to me that she has remained asymptomatic. She has no abdominal pain. She has remained on her antacid medications Review of Systems     Objective:   Physical Exam On exam, her abdomen is soft and nontender    Assessment:     Chronic gastric ulcer     Plan:     Because of other medical issues she was to hold any further discussion of surgery. I believe this is reasonable as she is asymptomatic. Should she develop symptoms again, she will need another endoscopy before considering surgery.

## 2012-08-12 DIAGNOSIS — M47817 Spondylosis without myelopathy or radiculopathy, lumbosacral region: Secondary | ICD-10-CM | POA: Diagnosis not present

## 2012-08-20 DIAGNOSIS — G894 Chronic pain syndrome: Secondary | ICD-10-CM | POA: Diagnosis not present

## 2012-08-20 DIAGNOSIS — M5137 Other intervertebral disc degeneration, lumbosacral region: Secondary | ICD-10-CM | POA: Diagnosis not present

## 2012-08-20 DIAGNOSIS — M5380 Other specified dorsopathies, site unspecified: Secondary | ICD-10-CM | POA: Diagnosis not present

## 2012-08-20 DIAGNOSIS — Z79899 Other long term (current) drug therapy: Secondary | ICD-10-CM | POA: Diagnosis not present

## 2012-08-27 DIAGNOSIS — E7800 Pure hypercholesterolemia, unspecified: Secondary | ICD-10-CM | POA: Diagnosis not present

## 2012-08-27 DIAGNOSIS — I10 Essential (primary) hypertension: Secondary | ICD-10-CM | POA: Diagnosis not present

## 2012-09-02 DIAGNOSIS — M47817 Spondylosis without myelopathy or radiculopathy, lumbosacral region: Secondary | ICD-10-CM | POA: Diagnosis not present

## 2012-09-03 DIAGNOSIS — E559 Vitamin D deficiency, unspecified: Secondary | ICD-10-CM | POA: Diagnosis not present

## 2012-09-03 DIAGNOSIS — IMO0002 Reserved for concepts with insufficient information to code with codable children: Secondary | ICD-10-CM | POA: Diagnosis not present

## 2012-09-03 DIAGNOSIS — M76899 Other specified enthesopathies of unspecified lower limb, excluding foot: Secondary | ICD-10-CM | POA: Diagnosis not present

## 2012-09-03 DIAGNOSIS — M069 Rheumatoid arthritis, unspecified: Secondary | ICD-10-CM | POA: Diagnosis not present

## 2012-09-08 DIAGNOSIS — M5137 Other intervertebral disc degeneration, lumbosacral region: Secondary | ICD-10-CM | POA: Diagnosis not present

## 2012-09-08 DIAGNOSIS — M5380 Other specified dorsopathies, site unspecified: Secondary | ICD-10-CM | POA: Diagnosis not present

## 2012-09-08 DIAGNOSIS — G894 Chronic pain syndrome: Secondary | ICD-10-CM | POA: Diagnosis not present

## 2012-09-08 DIAGNOSIS — IMO0001 Reserved for inherently not codable concepts without codable children: Secondary | ICD-10-CM | POA: Diagnosis not present

## 2012-09-10 DIAGNOSIS — IMO0002 Reserved for concepts with insufficient information to code with codable children: Secondary | ICD-10-CM | POA: Diagnosis not present

## 2012-09-10 DIAGNOSIS — M76899 Other specified enthesopathies of unspecified lower limb, excluding foot: Secondary | ICD-10-CM | POA: Diagnosis not present

## 2012-09-10 DIAGNOSIS — M069 Rheumatoid arthritis, unspecified: Secondary | ICD-10-CM | POA: Diagnosis not present

## 2012-09-10 DIAGNOSIS — E559 Vitamin D deficiency, unspecified: Secondary | ICD-10-CM | POA: Diagnosis not present

## 2012-11-10 DIAGNOSIS — S01309A Unspecified open wound of unspecified ear, initial encounter: Secondary | ICD-10-CM | POA: Diagnosis not present

## 2012-11-12 DIAGNOSIS — S01309A Unspecified open wound of unspecified ear, initial encounter: Secondary | ICD-10-CM | POA: Diagnosis not present

## 2012-11-18 ENCOUNTER — Encounter (INDEPENDENT_AMBULATORY_CARE_PROVIDER_SITE_OTHER): Payer: Self-pay

## 2012-11-20 ENCOUNTER — Telehealth: Payer: Self-pay | Admitting: Hematology and Oncology

## 2012-11-20 NOTE — Telephone Encounter (Signed)
Moved 10/6 lb/fu to 11/4 w/NG. S/w pt re change and new d/t for lb/NG 11/4 @ 2pm.

## 2012-11-24 ENCOUNTER — Ambulatory Visit: Payer: Federal, State, Local not specified - PPO

## 2012-11-24 ENCOUNTER — Other Ambulatory Visit: Payer: Federal, State, Local not specified - PPO | Admitting: Lab

## 2012-12-04 DIAGNOSIS — N390 Urinary tract infection, site not specified: Secondary | ICD-10-CM | POA: Diagnosis not present

## 2012-12-04 DIAGNOSIS — E559 Vitamin D deficiency, unspecified: Secondary | ICD-10-CM | POA: Diagnosis not present

## 2012-12-04 DIAGNOSIS — M069 Rheumatoid arthritis, unspecified: Secondary | ICD-10-CM | POA: Diagnosis not present

## 2012-12-04 DIAGNOSIS — Z23 Encounter for immunization: Secondary | ICD-10-CM | POA: Diagnosis not present

## 2012-12-04 DIAGNOSIS — IMO0002 Reserved for concepts with insufficient information to code with codable children: Secondary | ICD-10-CM | POA: Diagnosis not present

## 2012-12-04 DIAGNOSIS — M76899 Other specified enthesopathies of unspecified lower limb, excluding foot: Secondary | ICD-10-CM | POA: Diagnosis not present

## 2012-12-12 ENCOUNTER — Other Ambulatory Visit: Payer: Self-pay | Admitting: Family Medicine

## 2012-12-12 ENCOUNTER — Ambulatory Visit
Admission: RE | Admit: 2012-12-12 | Discharge: 2012-12-12 | Disposition: A | Discharge status: Home / Self Care | Payer: Medicare Other | Source: Ambulatory Visit | Attending: Family Medicine | Admitting: Family Medicine

## 2012-12-12 DIAGNOSIS — R52 Pain, unspecified: Secondary | ICD-10-CM

## 2012-12-12 DIAGNOSIS — R079 Chest pain, unspecified: Secondary | ICD-10-CM | POA: Diagnosis not present

## 2012-12-12 DIAGNOSIS — N390 Urinary tract infection, site not specified: Secondary | ICD-10-CM | POA: Diagnosis not present

## 2012-12-23 ENCOUNTER — Encounter: Payer: Self-pay | Admitting: Hematology and Oncology

## 2012-12-23 ENCOUNTER — Ambulatory Visit (HOSPITAL_BASED_OUTPATIENT_CLINIC_OR_DEPARTMENT_OTHER): Payer: Medicare Other | Admitting: Hematology and Oncology

## 2012-12-23 ENCOUNTER — Other Ambulatory Visit (HOSPITAL_BASED_OUTPATIENT_CLINIC_OR_DEPARTMENT_OTHER): Payer: Medicare Other | Admitting: Lab

## 2012-12-23 ENCOUNTER — Encounter (INDEPENDENT_AMBULATORY_CARE_PROVIDER_SITE_OTHER): Payer: Self-pay

## 2012-12-23 VITALS — BP 140/83 | HR 76 | Temp 97.2°F | Resp 19 | Ht 66.0 in | Wt 177.4 lb

## 2012-12-23 DIAGNOSIS — D649 Anemia, unspecified: Secondary | ICD-10-CM | POA: Diagnosis not present

## 2012-12-23 DIAGNOSIS — M069 Rheumatoid arthritis, unspecified: Secondary | ICD-10-CM

## 2012-12-23 LAB — COMPREHENSIVE METABOLIC PANEL (CC13)
ALT: <6 U/L (ref 0–55)
AST: 20 U/L (ref 5–34)
Albumin: 3.4 g/dL — ABNORMAL LOW (ref 3.5–5.0)
Alkaline Phosphatase: 83 U/L (ref 40–150)
Anion Gap: 12 mEq/L — ABNORMAL HIGH (ref 3–11)
BUN: 12.2 mg/dL (ref 7.0–26.0)
CO2: 27 mEq/L (ref 22–29)
Calcium: 9.7 mg/dL (ref 8.4–10.4)
Chloride: 103 mEq/L (ref 98–109)
Creatinine: 0.9 mg/dL (ref 0.6–1.1)
Glucose: 89 mg/dl (ref 70–140)
Potassium: 4.2 mEq/L (ref 3.5–5.1)
Sodium: 142 mEq/L (ref 136–145)
Total Bilirubin: 0.21 mg/dL (ref 0.20–1.20)
Total Protein: 7.1 g/dL (ref 6.4–8.3)

## 2012-12-23 LAB — CBC WITH DIFFERENTIAL/PLATELET
BASO%: 1.5 % (ref 0.0–2.0)
Basophils Absolute: 0.1 10*3/uL (ref 0.0–0.1)
EOS%: 14.6 % — ABNORMAL HIGH (ref 0.0–7.0)
Eosinophils Absolute: 1.2 10*3/uL — ABNORMAL HIGH (ref 0.0–0.5)
HCT: 31.3 % — ABNORMAL LOW (ref 34.8–46.6)
HGB: 9.9 g/dL — ABNORMAL LOW (ref 11.6–15.9)
LYMPH%: 25.7 % (ref 14.0–49.7)
MCH: 26.1 pg (ref 25.1–34.0)
MCHC: 31.6 g/dL (ref 31.5–36.0)
MCV: 82.6 fL (ref 79.5–101.0)
MONO#: 0.7 10*3/uL (ref 0.1–0.9)
MONO%: 8.3 % (ref 0.0–14.0)
NEUT#: 4.1 10*3/uL (ref 1.5–6.5)
NEUT%: 49.9 % (ref 38.4–76.8)
Platelets: 404 10*3/uL — ABNORMAL HIGH (ref 145–400)
RBC: 3.8 10*6/uL (ref 3.70–5.45)
RDW: 16.4 % — ABNORMAL HIGH (ref 11.2–14.5)
WBC: 8.2 10*3/uL (ref 3.9–10.3)
lymph#: 2.1 10*3/uL (ref 0.9–3.3)

## 2012-12-23 LAB — IRON AND TIBC CHCC
%SAT: 14 % — ABNORMAL LOW (ref 21–57)
Iron: 42 ug/dL (ref 41–142)
TIBC: 294 ug/dL (ref 236–444)
UIBC: 252 ug/dL (ref 120–384)

## 2012-12-23 LAB — FERRITIN CHCC: Ferritin: 25 ng/ml (ref 9–269)

## 2012-12-23 NOTE — Progress Notes (Signed)
[facility] OFFICE PROGRESS NOTE  Geraldo Pitter, MD DIAGNOSIS:  Anemia chronic disease  SUMMARY OF HEMATOLOGIC HISTORY: This is a pleasant lady with a prior history of rheumatoid arthritis, on medication, and was noted to have chronic anemia. INTERVAL HISTORY: Cheyenne Gould 67 y.o. female returns for further followup. The patient denies any recent signs or symptoms of bleeding such as spontaneous epistaxis, hematuria or hematochezia. She denies any recent fever, chills, night sweats or abnormal weight loss She has no signs and symptoms of anemia such as dizziness, muscle cramps, shortness of breath or chest pain. 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  I have reviewed the past medical history, past surgical history, social history and family history with the patient and they are unchanged from previous note.  ALLERGIES:  is allergic to peanut-containing drug products; lyrica; penicillins; shellfish allergy; and sulfa antibiotics.  MEDICATIONS:  Current Outpatient Prescriptions  Medication Sig Dispense Refill  . acetaminophen (TYLENOL) 500 MG tablet Take 500 mg by mouth every 6 (six) hours as needed.        . chlorhexidine (PERIDEX) 0.12 % solution       . EXFORGE HCT 5-160-12.5 MG TABS       . fluticasone (FLONASE) 50 MCG/ACT nasal spray       . HYDROcodone-acetaminophen (NORCO/VICODIN) 5-325 MG per tablet       . ipratropium (ATROVENT) 0.03 % nasal spray       . leflunomide (ARAVA) 20 MG tablet Take 20 mg by mouth daily.        Marland Kitchen levocetirizine (XYZAL) 5 MG tablet       . NEXIUM 40 MG capsule Take 40 mg by mouth 2 (two) times daily.       . sucralfate (CARAFATE) 1 G tablet       . traMADol (ULTRAM) 50 MG tablet       . VITAMIN D, ERGOCALCIFEROL, PO Take by mouth.         No current facility-administered medications for this visit.     REVIEW OF SYSTEMS:   Constitutional: Denies fevers, chills or  night sweats Eyes: Denies blurriness of vision Ears, nose, mouth, throat, and face: Denies mucositis or sore throat Respiratory: Denies cough, dyspnea or wheezes Cardiovascular: Denies palpitation, chest discomfort or lower extremity swelling Gastrointestinal:  Denies nausea, heartburn or change in bowel habits Skin: Denies abnormal skin rashes Lymphatics: Denies new lymphadenopathy or easy bruising Neurological:Denies numbness, tingling or new weaknesses Behavioral/Psych: Mood is stable, no new changes  All other systems were reviewed with the patient and are negative.  PHYSICAL EXAMINATION: ECOG PERFORMANCE STATUS: 0 - Asymptomatic  Filed Vitals:   12/23/12 1409  BP: 140/83  Pulse: 76  Temp: 97.2 F (36.2 C)  Resp: 19   Filed Weights   12/23/12 1409  Weight: 177 lb 6.4 oz (80.468 kg)    GENERAL:alert, no distress and comfortable SKIN: skin color, texture, turgor are normal, no rashes or significant lesions EYES: normal, Conjunctiva are pink and non-injected, sclera clear OROPHARYNX:no exudate, no erythema and lips, buccal mucosa, and tongue normal  NECK: supple, thyroid normal size, non-tender, without nodularity LYMPH:  no palpable lymphadenopathy in the cervical, axillary or inguinal LUNGS: clear to auscultation and percussion with normal breathing effort HEART: regular rate & rhythm and no murmurs and no lower extremity edema ABDOMEN:abdomen soft, non-tender and normal bowel sounds Musculoskeletal:no cyanosis of digits and no clubbing  NEURO: alert &  oriented x 3 with fluent speech, no focal motor/sensory deficits  LABORATORY DATA:  I have reviewed the data as listed Results for orders placed in visit on 12/23/12 (from the past 48 hour(s))  CBC WITH DIFFERENTIAL     Status: Abnormal   Collection Time    12/23/12  1:56 PM      Result Value Range   WBC 8.2  3.9 - 10.3 10e3/uL   NEUT# 4.1  1.5 - 6.5 10e3/uL   HGB 9.9 (*) 11.6 - 15.9 g/dL   HCT 16.1 (*) 09.6 - 04.5  %   Platelets 404 (*) 145 - 400 10e3/uL   MCV 82.6  79.5 - 101.0 fL   MCH 26.1  25.1 - 34.0 pg   MCHC 31.6  31.5 - 36.0 g/dL   RBC 4.09  8.11 - 9.14 10e6/uL   RDW 16.4 (*) 11.2 - 14.5 %   lymph# 2.1  0.9 - 3.3 10e3/uL   MONO# 0.7  0.1 - 0.9 10e3/uL   Eosinophils Absolute 1.2 (*) 0.0 - 0.5 10e3/uL   Basophils Absolute 0.1  0.0 - 0.1 10e3/uL   NEUT% 49.9  38.4 - 76.8 %   LYMPH% 25.7  14.0 - 49.7 %   MONO% 8.3  0.0 - 14.0 %   EOS% 14.6 (*) 0.0 - 7.0 %   BASO% 1.5  0.0 - 2.0 %     ASSESSMENT:  #1 Chronic anemia #2 chronic rheumatoid arthritis  PLAN:  #1 chronic anemia #2 chronic rheumatoid arthritis This is likely anemia of chronic disease. The patient denies recent history of bleeding such as epistaxis, hematuria or hematochezia. She is asymptomatic from the anemia. We will observe for now.  She does not require transfusion now.  #3 mild eosinophilia She does not fulfill the criteria for hypereosinophilic syndrome. The patient has significant allergies to food and the environment. She has no new medications or pets. She take a prescription anti-allergy medicine. No further workup is needed.  The patient is stable CBC for many years. She does not need treatment right now and she is asymptomatic. I gave her an option to come back here for yearly followup or just a call if needed. The patient chose to only call to make a return appointment if needed. All questions were answered. The patient knows to call the clinic with any problems, questions or concerns. No barriers to learning was detected.  I spent 15 minutes counseling the patient face to face. The total time spent in the appointment was 20 minutes and more than 50% was on counseling.     [facility] Gasper Hopes, MD 12/23/2012 2:41 PM

## 2012-12-25 DIAGNOSIS — I10 Essential (primary) hypertension: Secondary | ICD-10-CM | POA: Diagnosis not present

## 2012-12-25 DIAGNOSIS — D649 Anemia, unspecified: Secondary | ICD-10-CM | POA: Diagnosis not present

## 2013-03-09 DIAGNOSIS — M069 Rheumatoid arthritis, unspecified: Secondary | ICD-10-CM | POA: Diagnosis not present

## 2013-03-09 DIAGNOSIS — E559 Vitamin D deficiency, unspecified: Secondary | ICD-10-CM | POA: Diagnosis not present

## 2013-03-09 DIAGNOSIS — IMO0002 Reserved for concepts with insufficient information to code with codable children: Secondary | ICD-10-CM | POA: Diagnosis not present

## 2013-03-11 DIAGNOSIS — Z91010 Allergy to peanuts: Secondary | ICD-10-CM | POA: Diagnosis not present

## 2013-03-11 DIAGNOSIS — D721 Eosinophilia, unspecified: Secondary | ICD-10-CM | POA: Diagnosis not present

## 2013-03-11 DIAGNOSIS — R21 Rash and other nonspecific skin eruption: Secondary | ICD-10-CM | POA: Diagnosis not present

## 2013-03-11 DIAGNOSIS — J301 Allergic rhinitis due to pollen: Secondary | ICD-10-CM | POA: Diagnosis not present

## 2013-03-11 DIAGNOSIS — J3089 Other allergic rhinitis: Secondary | ICD-10-CM | POA: Diagnosis not present

## 2013-03-30 DIAGNOSIS — I10 Essential (primary) hypertension: Secondary | ICD-10-CM | POA: Diagnosis not present

## 2013-04-01 DIAGNOSIS — I10 Essential (primary) hypertension: Secondary | ICD-10-CM | POA: Diagnosis not present

## 2013-04-01 DIAGNOSIS — E7800 Pure hypercholesterolemia, unspecified: Secondary | ICD-10-CM | POA: Diagnosis not present

## 2013-04-24 DIAGNOSIS — Z1231 Encounter for screening mammogram for malignant neoplasm of breast: Secondary | ICD-10-CM | POA: Diagnosis not present

## 2013-04-24 DIAGNOSIS — Z780 Asymptomatic menopausal state: Secondary | ICD-10-CM | POA: Diagnosis not present

## 2013-04-30 DIAGNOSIS — R350 Frequency of micturition: Secondary | ICD-10-CM | POA: Diagnosis not present

## 2013-05-11 DIAGNOSIS — Z1211 Encounter for screening for malignant neoplasm of colon: Secondary | ICD-10-CM | POA: Diagnosis not present

## 2013-06-08 DIAGNOSIS — R209 Unspecified disturbances of skin sensation: Secondary | ICD-10-CM | POA: Diagnosis not present

## 2013-06-10 DIAGNOSIS — M67919 Unspecified disorder of synovium and tendon, unspecified shoulder: Secondary | ICD-10-CM | POA: Diagnosis not present

## 2013-06-10 DIAGNOSIS — E559 Vitamin D deficiency, unspecified: Secondary | ICD-10-CM | POA: Diagnosis not present

## 2013-06-10 DIAGNOSIS — IMO0002 Reserved for concepts with insufficient information to code with codable children: Secondary | ICD-10-CM | POA: Diagnosis not present

## 2013-06-10 DIAGNOSIS — M069 Rheumatoid arthritis, unspecified: Secondary | ICD-10-CM | POA: Diagnosis not present

## 2013-06-12 DIAGNOSIS — R21 Rash and other nonspecific skin eruption: Secondary | ICD-10-CM | POA: Diagnosis not present

## 2013-06-12 DIAGNOSIS — Z91010 Allergy to peanuts: Secondary | ICD-10-CM | POA: Diagnosis not present

## 2013-06-12 DIAGNOSIS — J3089 Other allergic rhinitis: Secondary | ICD-10-CM | POA: Diagnosis not present

## 2013-06-12 DIAGNOSIS — J301 Allergic rhinitis due to pollen: Secondary | ICD-10-CM | POA: Diagnosis not present

## 2013-06-15 DIAGNOSIS — H43819 Vitreous degeneration, unspecified eye: Secondary | ICD-10-CM | POA: Diagnosis not present

## 2013-06-15 DIAGNOSIS — H2589 Other age-related cataract: Secondary | ICD-10-CM | POA: Diagnosis not present

## 2013-06-15 DIAGNOSIS — H43399 Other vitreous opacities, unspecified eye: Secondary | ICD-10-CM | POA: Diagnosis not present

## 2013-07-06 DIAGNOSIS — R209 Unspecified disturbances of skin sensation: Secondary | ICD-10-CM | POA: Diagnosis not present

## 2013-07-20 DIAGNOSIS — H43819 Vitreous degeneration, unspecified eye: Secondary | ICD-10-CM | POA: Diagnosis not present

## 2013-07-20 DIAGNOSIS — H2589 Other age-related cataract: Secondary | ICD-10-CM | POA: Diagnosis not present

## 2013-08-10 DIAGNOSIS — IMO0001 Reserved for inherently not codable concepts without codable children: Secondary | ICD-10-CM | POA: Diagnosis not present

## 2013-08-10 DIAGNOSIS — Z79899 Other long term (current) drug therapy: Secondary | ICD-10-CM | POA: Diagnosis not present

## 2013-08-10 DIAGNOSIS — M5380 Other specified dorsopathies, site unspecified: Secondary | ICD-10-CM | POA: Diagnosis not present

## 2013-08-10 DIAGNOSIS — G894 Chronic pain syndrome: Secondary | ICD-10-CM | POA: Diagnosis not present

## 2013-08-13 DIAGNOSIS — R635 Abnormal weight gain: Secondary | ICD-10-CM | POA: Diagnosis not present

## 2013-08-13 DIAGNOSIS — G8929 Other chronic pain: Secondary | ICD-10-CM | POA: Diagnosis not present

## 2013-09-23 DIAGNOSIS — M5137 Other intervertebral disc degeneration, lumbosacral region: Secondary | ICD-10-CM | POA: Diagnosis not present

## 2013-09-30 DIAGNOSIS — Z79899 Other long term (current) drug therapy: Secondary | ICD-10-CM | POA: Diagnosis not present

## 2013-09-30 DIAGNOSIS — G894 Chronic pain syndrome: Secondary | ICD-10-CM | POA: Diagnosis not present

## 2013-09-30 DIAGNOSIS — M5137 Other intervertebral disc degeneration, lumbosacral region: Secondary | ICD-10-CM | POA: Diagnosis not present

## 2013-09-30 DIAGNOSIS — M5380 Other specified dorsopathies, site unspecified: Secondary | ICD-10-CM | POA: Diagnosis not present

## 2013-10-12 DIAGNOSIS — M25549 Pain in joints of unspecified hand: Secondary | ICD-10-CM | POA: Diagnosis not present

## 2013-10-12 DIAGNOSIS — IMO0002 Reserved for concepts with insufficient information to code with codable children: Secondary | ICD-10-CM | POA: Diagnosis not present

## 2013-10-12 DIAGNOSIS — M069 Rheumatoid arthritis, unspecified: Secondary | ICD-10-CM | POA: Diagnosis not present

## 2013-10-12 DIAGNOSIS — E559 Vitamin D deficiency, unspecified: Secondary | ICD-10-CM | POA: Diagnosis not present

## 2013-10-12 DIAGNOSIS — M67919 Unspecified disorder of synovium and tendon, unspecified shoulder: Secondary | ICD-10-CM | POA: Diagnosis not present

## 2013-10-14 DIAGNOSIS — E7800 Pure hypercholesterolemia, unspecified: Secondary | ICD-10-CM | POA: Diagnosis not present

## 2013-10-14 DIAGNOSIS — M539 Dorsopathy, unspecified: Secondary | ICD-10-CM | POA: Diagnosis not present

## 2013-10-14 DIAGNOSIS — I10 Essential (primary) hypertension: Secondary | ICD-10-CM | POA: Diagnosis not present

## 2013-10-14 DIAGNOSIS — M1991 Primary osteoarthritis, unspecified site: Secondary | ICD-10-CM | POA: Diagnosis not present

## 2013-10-21 DIAGNOSIS — M5137 Other intervertebral disc degeneration, lumbosacral region: Secondary | ICD-10-CM | POA: Diagnosis not present

## 2013-10-28 DIAGNOSIS — G894 Chronic pain syndrome: Secondary | ICD-10-CM | POA: Diagnosis not present

## 2013-10-28 DIAGNOSIS — M5137 Other intervertebral disc degeneration, lumbosacral region: Secondary | ICD-10-CM | POA: Diagnosis not present

## 2013-10-28 DIAGNOSIS — Z79899 Other long term (current) drug therapy: Secondary | ICD-10-CM | POA: Diagnosis not present

## 2013-10-28 DIAGNOSIS — G5690 Unspecified mononeuropathy of unspecified upper limb: Secondary | ICD-10-CM | POA: Diagnosis not present

## 2013-11-06 DIAGNOSIS — Z23 Encounter for immunization: Secondary | ICD-10-CM | POA: Diagnosis not present

## 2013-11-18 DIAGNOSIS — K219 Gastro-esophageal reflux disease without esophagitis: Secondary | ICD-10-CM | POA: Diagnosis not present

## 2013-11-19 DIAGNOSIS — S43499A Other sprain of unspecified shoulder joint, initial encounter: Secondary | ICD-10-CM | POA: Diagnosis not present

## 2013-11-25 DIAGNOSIS — M5408 Panniculitis affecting regions of neck and back, sacral and sacrococcygeal region: Secondary | ICD-10-CM | POA: Diagnosis not present

## 2013-11-25 DIAGNOSIS — M5137 Other intervertebral disc degeneration, lumbosacral region: Secondary | ICD-10-CM | POA: Diagnosis not present

## 2013-11-25 DIAGNOSIS — G894 Chronic pain syndrome: Secondary | ICD-10-CM | POA: Diagnosis not present

## 2013-11-25 DIAGNOSIS — Z79891 Long term (current) use of opiate analgesic: Secondary | ICD-10-CM | POA: Diagnosis not present

## 2013-11-25 DIAGNOSIS — Z79899 Other long term (current) drug therapy: Secondary | ICD-10-CM | POA: Diagnosis not present

## 2014-01-01 ENCOUNTER — Encounter (HOSPITAL_COMMUNITY): Payer: Self-pay | Admitting: Emergency Medicine

## 2014-01-01 ENCOUNTER — Observation Stay (HOSPITAL_COMMUNITY)
Admission: EM | Admit: 2014-01-01 | Discharge: 2014-01-02 | Disposition: A | Discharge status: Home / Self Care | Payer: Medicare Other | Attending: Internal Medicine | Admitting: Internal Medicine

## 2014-01-01 ENCOUNTER — Emergency Department (HOSPITAL_COMMUNITY): Payer: Medicare Other

## 2014-01-01 DIAGNOSIS — Z880 Allergy status to penicillin: Secondary | ICD-10-CM | POA: Diagnosis not present

## 2014-01-01 DIAGNOSIS — D649 Anemia, unspecified: Secondary | ICD-10-CM

## 2014-01-01 DIAGNOSIS — M069 Rheumatoid arthritis, unspecified: Secondary | ICD-10-CM

## 2014-01-01 DIAGNOSIS — K219 Gastro-esophageal reflux disease without esophagitis: Secondary | ICD-10-CM | POA: Diagnosis not present

## 2014-01-01 DIAGNOSIS — R0789 Other chest pain: Principal | ICD-10-CM | POA: Insufficient documentation

## 2014-01-01 DIAGNOSIS — Z882 Allergy status to sulfonamides status: Secondary | ICD-10-CM | POA: Diagnosis not present

## 2014-01-01 DIAGNOSIS — R0602 Shortness of breath: Secondary | ICD-10-CM | POA: Diagnosis not present

## 2014-01-01 DIAGNOSIS — E785 Hyperlipidemia, unspecified: Secondary | ICD-10-CM | POA: Diagnosis not present

## 2014-01-01 DIAGNOSIS — Z888 Allergy status to other drugs, medicaments and biological substances status: Secondary | ICD-10-CM | POA: Insufficient documentation

## 2014-01-01 DIAGNOSIS — Z91010 Allergy to peanuts: Secondary | ICD-10-CM | POA: Insufficient documentation

## 2014-01-01 DIAGNOSIS — I10 Essential (primary) hypertension: Secondary | ICD-10-CM | POA: Diagnosis not present

## 2014-01-01 DIAGNOSIS — R079 Chest pain, unspecified: Secondary | ICD-10-CM | POA: Diagnosis present

## 2014-01-01 DIAGNOSIS — Z87891 Personal history of nicotine dependence: Secondary | ICD-10-CM | POA: Diagnosis not present

## 2014-01-01 HISTORY — DX: Chest pain, unspecified: R07.9

## 2014-01-01 HISTORY — DX: Gastro-esophageal reflux disease without esophagitis: K21.9

## 2014-01-01 LAB — BASIC METABOLIC PANEL
Anion gap: 14 (ref 5–15)
BUN: 14 mg/dL (ref 6–23)
CO2: 26 mEq/L (ref 19–32)
Calcium: 9.5 mg/dL (ref 8.4–10.5)
Chloride: 100 mEq/L (ref 96–112)
Creatinine, Ser: 1.17 mg/dL — ABNORMAL HIGH (ref 0.50–1.10)
GFR calc Af Amer: 55 mL/min — ABNORMAL LOW (ref >90)
GFR calc non Af Amer: 47 mL/min — ABNORMAL LOW (ref >90)
Glucose, Bld: 92 mg/dL (ref 70–99)
Potassium: 4.4 mEq/L (ref 3.7–5.3)
Sodium: 140 mEq/L (ref 137–147)

## 2014-01-01 LAB — I-STAT TROPONIN, ED: Troponin i, poc: 0 ng/mL (ref 0.00–0.08)

## 2014-01-01 LAB — TROPONIN I
Troponin I: <0.3 ng/mL (ref <0.30)
Troponin I: <0.3 ng/mL (ref <0.30)

## 2014-01-01 LAB — CBC
HCT: 32.9 % — ABNORMAL LOW (ref 36.0–46.0)
Hemoglobin: 10.5 g/dL — ABNORMAL LOW (ref 12.0–15.0)
MCH: 27 pg (ref 26.0–34.0)
MCHC: 31.9 g/dL (ref 30.0–36.0)
MCV: 84.6 fL (ref 78.0–100.0)
Platelets: 396 10*3/uL (ref 150–400)
RBC: 3.89 MIL/uL (ref 3.87–5.11)
RDW: 15.1 % (ref 11.5–15.5)
WBC: 9.7 10*3/uL (ref 4.0–10.5)

## 2014-01-01 LAB — PRO B NATRIURETIC PEPTIDE: Pro B Natriuretic peptide (BNP): 18.8 pg/mL (ref 0–125)

## 2014-01-01 MED ORDER — FLUTICASONE PROPIONATE 50 MCG/ACT NA SUSP
2.0000 | Freq: Every day | NASAL | Status: DC | PRN
  Filled 2014-01-01: qty 16

## 2014-01-01 MED ORDER — ACETAMINOPHEN 325 MG PO TABS
650.0000 mg | ORAL_TABLET | ORAL | Status: DC | PRN
Start: 2014-01-01 — End: 2014-01-02

## 2014-01-01 MED ORDER — TRAMADOL HCL 50 MG PO TABS: 50.0000 mg | ORAL_TABLET | Freq: Four times a day (QID) | ORAL | Status: DC | PRN

## 2014-01-01 MED ORDER — SUCRALFATE 1 G PO TABS
1.0000 g | ORAL_TABLET | Freq: Four times a day (QID) | ORAL | Status: DC | PRN
  Filled 2014-01-01: qty 1

## 2014-01-01 MED ORDER — ONDANSETRON HCL 4 MG/2ML IJ SOLN: 4.0000 mg | Freq: Four times a day (QID) | INTRAMUSCULAR | Status: DC | PRN

## 2014-01-01 MED ORDER — HYDROCODONE-ACETAMINOPHEN 5-325 MG PO TABS: 1.0000 | ORAL_TABLET | Freq: Four times a day (QID) | ORAL | Status: DC | PRN

## 2014-01-01 MED ORDER — CETIRIZINE HCL 10 MG PO TABS
5.0000 mg | ORAL_TABLET | Freq: Every day | ORAL | Status: DC
  Administered 2014-01-01: 5 mg via ORAL
  Filled 2014-01-01: qty 1

## 2014-01-01 MED ORDER — AMLODIPINE BESYLATE-VALSARTAN 5-160 MG PO TABS: 1.0000 | ORAL_TABLET | Freq: Every day | ORAL | Status: DC

## 2014-01-01 MED ORDER — NITROGLYCERIN 0.4 MG SL SUBL: 0.4000 mg | SUBLINGUAL_TABLET | SUBLINGUAL | Status: DC | PRN

## 2014-01-01 MED ORDER — LEFLUNOMIDE 20 MG PO TABS
20.0000 mg | ORAL_TABLET | Freq: Every day | ORAL | Status: DC
  Administered 2014-01-02: 20 mg via ORAL
  Filled 2014-01-01 (×2): qty 1

## 2014-01-01 MED ORDER — ASPIRIN 325 MG PO TABS
325.0000 mg | ORAL_TABLET | ORAL | Status: AC
  Administered 2014-01-01: 325 mg via ORAL
  Filled 2014-01-01: qty 1

## 2014-01-01 MED ORDER — HEPARIN SODIUM (PORCINE) 5000 UNIT/ML IJ SOLN
5000.0000 [IU] | Freq: Three times a day (TID) | INTRAMUSCULAR | Status: DC
  Administered 2014-01-01 – 2014-01-02 (×3): 5000 [IU] via SUBCUTANEOUS
  Filled 2014-01-01 (×4): qty 1

## 2014-01-01 MED ORDER — PANTOPRAZOLE SODIUM 40 MG PO TBEC
80.0000 mg | DELAYED_RELEASE_TABLET | Freq: Every day | ORAL | Status: DC
  Administered 2014-01-02: 80 mg via ORAL
  Filled 2014-01-01: qty 2

## 2014-01-01 MED ORDER — IRBESARTAN 150 MG PO TABS
150.0000 mg | ORAL_TABLET | Freq: Every day | ORAL | Status: DC
  Filled 2014-01-01: qty 1

## 2014-01-01 MED ORDER — ACETAMINOPHEN 500 MG PO TABS: 500.0000 mg | ORAL_TABLET | Freq: Four times a day (QID) | ORAL | Status: DC | PRN

## 2014-01-01 MED ORDER — AMLODIPINE BESYLATE 5 MG PO TABS
5.0000 mg | ORAL_TABLET | Freq: Every day | ORAL | Status: DC
  Filled 2014-01-01: qty 1

## 2014-01-01 NOTE — ED Provider Notes (Signed)
CSN: [id number]     Arrival date & time 01/01/14  1747 History   First MD Initiated Contact with Patient 01/01/14 1756     Chief Complaint  Patient presents with  . Chest Pain  . Shortness of Breath     (Consider location/radiation/quality/duration/timing/severity/associated sxs/prior Treatment) The history is provided by the patient and medical records.    This is a 68 y.o. F with PMH significant for HTN, HLP, RA, anemia, acid reflux, presenting to the ED for central chest pain.  Patient states for the past 24 hours she has been having intermittent central chest pain which she describes as a pressure as if someone were sitting on her chest, without radiation.  Patient endorses associated shortness of breath, fatigue, lightheadedness, diaphoresis, and nausea when pain occurs.  She denies vomiting.  Pain occurs independent of exertion, does admit to standing more than normal today at work.  Patient has no prior cardiac hx.  Strong family hx of cardiac disease-- father and paternal grandmother.  Patient was a former smoker, quit more than 10 years ago.  She has never had any type of cardiac evaluation.  Patient does have GERD, states this pain is drastically different than her reflux symptoms.  VS stable on arrival.  Past Medical History  Diagnosis Date  . Rheumatoid arthritis(714.0)     on Leflunomide  . Hypertension   . Hyperlipidemia   . Allergic sinusitis   . Anemia   . Acid reflux    Past Surgical History  Procedure Laterality Date  . Bunionectomy  2002,12/18/2006  . Abdominal hysterectomy  1985    Partial   Family History  Problem Relation Age of Onset  . Heart disease Father   . Hypertension Sister   . Lupus Brother   . Cancer Brother     Colon   History  Substance Use Topics  . Smoking status: Former Smoker    Types: Cigarettes    Quit date: 02/20/2003  . Smokeless tobacco: Never Used  . Alcohol Use: No   OB History    No data available     Review of Systems   Constitutional: Positive for diaphoresis and fatigue.  Respiratory: Positive for shortness of breath.   Cardiovascular: Positive for chest pain.  Neurological: Positive for light-headedness.  All other systems reviewed and are negative.     Allergies  Peanut-containing drug products; Lyrica; Penicillins; Shellfish allergy; and Sulfa antibiotics  Home Medications   Prior to Admission medications   Medication Sig Start Date End Date Taking? Authorizing Provider  acetaminophen (TYLENOL) 500 MG tablet Take 500 mg by mouth every 6 (six) hours as needed for mild pain or headache.    Yes Historical Provider, MD  amLODipine-valsartan (EXFORGE) 5-160 MG per tablet Take 1 tablet by mouth daily.   Yes Historical Provider, MD  clindamycin (CLEOCIN) 300 MG capsule Take 600 mg by mouth. Two hours prior to dental work.   Yes Historical Provider, MD  fluticasone (FLONASE) 50 MCG/ACT nasal spray Place 2 sprays into both nostrils daily as needed.  04/19/12  Yes Historical Provider, MD  HYDROcodone-acetaminophen (NORCO/VICODIN) 5-325 MG per tablet Take 1 tablet by mouth every 6 (six) hours as needed for moderate pain.  08/05/12  Yes Historical Provider, MD  leflunomide (ARAVA) 20 MG tablet Take 20 mg by mouth daily.     Yes Historical Provider, MD  levocetirizine (XYZAL) 5 MG tablet Take 5 mg by mouth at bedtime.  05/21/12  Yes Historical Provider, MD  Menthol,  Topical Analgesic, (BIOFREEZE EX) Apply 1 application topically daily as needed (knee pain.).   Yes Historical Provider, MD  NEXIUM 40 MG capsule Take 40 mg by mouth daily.  01/16/11  Yes Historical Provider, MD  OVER THE COUNTER MEDICATION Take 1 each by mouth daily. Dental Pre-wash   Yes Historical Provider, MD  sucralfate (CARAFATE) 1 G tablet Take 1 g by mouth 4 (four) times daily as needed.  05/18/12  Yes Historical Provider, MD  traMADol (ULTRAM) 50 MG tablet Take 50 mg by mouth every 6 (six) hours as needed.  06/26/12  Yes Historical Provider, MD   VITAMIN D, ERGOCALCIFEROL, PO Take 1 capsule by mouth daily.    Yes Historical Provider, MD  chlorhexidine (PERIDEX) 0.12 % solution  08/05/12   Historical Provider, MD  EXFORGE HCT 5-160-12.5 MG TABS  11/09/11   Historical Provider, MD  ipratropium (ATROVENT) 0.03 % nasal spray  02/27/12   Historical Provider, MD   BP 130/73 mmHg  Pulse 80  Resp 18  SpO2 97%   Physical Exam  Constitutional: She is oriented to person, place, and time. She appears well-developed and well-nourished. No distress.  HENT:  Head: Normocephalic and atraumatic.  Mouth/Throat: Oropharynx is clear and moist.  Eyes: Conjunctivae and EOM are normal. Pupils are equal, round, and reactive to light.  Neck: Normal range of motion. Neck supple.  Cardiovascular: Normal rate, regular rhythm and normal heart sounds.   Pulmonary/Chest: Effort normal and breath sounds normal. No respiratory distress. She has no wheezes. She has no rhonchi.  Abdominal: Soft. Bowel sounds are normal. There is no tenderness. There is no guarding.  Musculoskeletal: Normal range of motion. She exhibits no edema.  Neurological: She is alert and oriented to person, place, and time.  Skin: Skin is warm and dry. She is not diaphoretic.  Psychiatric: She has a normal mood and affect.  Nursing note and vitals reviewed.   ED Course  Procedures (including critical care time) Labs Review Labs Reviewed  CBC - Abnormal; Notable for the following:    Hemoglobin 10.5 (*)    HCT 32.9 (*)    All other components within normal limits  BASIC METABOLIC PANEL - Abnormal; Notable for the following:    Creatinine, Ser 1.17 (*)    GFR calc non Af Amer 47 (*)    GFR calc Af Amer 55 (*)    All other components within normal limits  PRO B NATRIURETIC PEPTIDE  I-STAT TROPOININ, ED    Imaging Review Dg Chest Port 1 View  01/01/2014   CLINICAL DATA:  Chest pain and shortness of breath.  EXAM: PORTABLE CHEST - 1 VIEW  COMPARISON:  12/12/2012  FINDINGS: The  heart size and mediastinal contours are within normal limits. Cardiac leads project of the chest. Both lungs are clear. The visualized skeletal structures are unremarkable.  IMPRESSION: No acute cardiopulmonary disease.   Electronically Signed   By: Britta Mccreedy M.D.   On: 01/01/2014 18:38     EKG Interpretation   Date/Time:  Friday January 01 2014 17:53:38 EST Ventricular Rate:  92 PR Interval:  138 QRS Duration: 75 QT Interval:  329 QTC Calculation: 407 R Axis:   35 Text Interpretation:  Sinus rhythm Paired ventricular premature complexes  Probable anteroseptal infarct, old Baseline wander in lead(s) II V2 No  previous tracing Confirmed by Anitra Lauth  MD, WHITNEY ([id number]) on 01/01/2014  6:41:46 PM      MDM   Final diagnoses:  Chest pain, unspecified chest  pain type  Essential hypertension  Hyperlipidemia  Rheumatoid arthritis  Chronic anemia   68 year old female with 24 hours of intermittent central chest pressure with associated shortness of breath, lightheadedness, diaphoresis, and nausea. She has no known cardiac history. EKG sinus rhythm without ischemic change. Troponin negative. Chest x-ray is clear. Remainder of lab work is reassuring.  Patient currently pain free after ASA.  Risk factors include hypertension, hyperlipidemia, family history, and former smoker, HEART score of 6.  Feel patient would benefit from observation and chest pain r/o as she has had no prior cardiac evaluation.  Case discussed with hospitalist, Dr. Julian Reil, who will admit to tele obs.  Temp admission orders placed, VS remain stable.  Garlon Hatchet, PA-C 01/01/14 2050  Gwyneth Sprout, MD 01/02/14 [phone]

## 2014-01-01 NOTE — ED Notes (Signed)
Pt c/o central chest pain that started yesterday with shob, lightheadedness and dizziness, sweating and nausea.  Pt does have acid reflux and thought it was that at first but states that this pain is different.

## 2014-01-01 NOTE — H&P (Signed)
[facility] History and Physical  Cheyenne Gould ENI:[id number] DOB: December 27, 1945 DOA: 01/01/2014  Referring physician: EDP PCP: Cheyenne Pitter, MD   Chief Complaint: Chest pain   HPI: Cheyenne Gould is a 68 y.o. female h/o HTN, HLD, RA, GERD, presents to ED with chest pain.  Located in central chest, onset 24 hours ago, occurs intermittently.  Pressure like quality, no radiation.  Associated with SOB, fatigue, lightheadedness, diaphoresis, and nausea.  No vomiting.  Occurs independent of exertion, was standing more than normal at work today.  No prior personal cardiac history, last stress test some 30 years ago she states.  Very strong family history of CAD (father, paternal grandmother).  Patient is former smoker as well.  CP is drastically different than GERD symptoms.  Review of Systems: Systems reviewed.  As above, otherwise negative  Past Medical History  Diagnosis Date  . Rheumatoid arthritis(714.0)     on Leflunomide  . Hypertension   . Hyperlipidemia   . Allergic sinusitis   . Anemia   . Acid reflux    Past Surgical History  Procedure Laterality Date  . Bunionectomy  2002,12/18/2006  . Abdominal hysterectomy  1985    Partial   Social History:  reports that she quit smoking about 10 years ago. Her smoking use included Cigarettes. She smoked 0.00 packs per day. She has never used smokeless tobacco. She reports that she does not drink alcohol or use illicit drugs.  Allergies  Allergen Reactions  . Peanut-Containing Drug Products Anaphylaxis  . Lyrica [Pregabalin]   . Penicillins   . Shellfish Allergy Nausea And Vomiting  . Sulfa Antibiotics     Family History  Problem Relation Age of Onset  . Heart disease Father   . Hypertension Sister   . Lupus Brother   . Cancer Brother     Colon     Prior to Admission medications   Medication Sig Start Date End Date Taking? Authorizing Provider  acetaminophen (TYLENOL) 500 MG tablet Take 500 mg by mouth every 6 (six)  hours as needed for mild pain or headache.    Yes Historical Provider, MD  amLODipine-valsartan (EXFORGE) 5-160 MG per tablet Take 1 tablet by mouth daily.   Yes Historical Provider, MD  clindamycin (CLEOCIN) 300 MG capsule Take 600 mg by mouth. Two hours prior to dental work.   Yes Historical Provider, MD  fluticasone (FLONASE) 50 MCG/ACT nasal spray Place 2 sprays into both nostrils daily as needed.  04/19/12  Yes Historical Provider, MD  HYDROcodone-acetaminophen (NORCO/VICODIN) 5-325 MG per tablet Take 1 tablet by mouth every 6 (six) hours as needed for moderate pain.  08/05/12  Yes Historical Provider, MD  leflunomide (ARAVA) 20 MG tablet Take 20 mg by mouth daily.     Yes Historical Provider, MD  levocetirizine (XYZAL) 5 MG tablet Take 5 mg by mouth at bedtime.  05/21/12  Yes Historical Provider, MD  Menthol, Topical Analgesic, (BIOFREEZE EX) Apply 1 application topically daily as needed (knee pain.).   Yes Historical Provider, MD  NEXIUM 40 MG capsule Take 40 mg by mouth daily.  01/16/11  Yes Historical Provider, MD  OVER THE COUNTER MEDICATION Take 1 each by mouth daily. Dental Pre-wash   Yes Historical Provider, MD  sucralfate (CARAFATE) 1 G tablet Take 1 g by mouth 4 (four) times daily as needed.  05/18/12  Yes Historical Provider, MD  traMADol (ULTRAM) 50 MG tablet Take 50 mg by mouth every 6 (six) hours as needed.  06/26/12  Yes  Historical Provider, MD  VITAMIN D, ERGOCALCIFEROL, PO Take 1 capsule by mouth daily.    Yes Historical Provider, MD   Physical Exam: Filed Vitals:   01/01/14 1908  BP: 130/73  Pulse: 85  Resp: 16    BP 130/73 mmHg  Pulse 85  Resp 16  SpO2 95%  General Appearance:    Alert, oriented, no distress, appears stated age  Head:    Normocephalic, atraumatic  Eyes:    PERRL, EOMI, sclera non-icteric        Nose:   Nares without drainage or epistaxis. Mucosa, turbinates normal  Throat:   Moist mucous membranes. Oropharynx without erythema or exudate.  Neck:    Supple. No carotid bruits.  No thyromegaly.  No lymphadenopathy.   Back:     No CVA tenderness, no spinal tenderness  Lungs:     Clear to auscultation bilaterally, without wheezes, rhonchi or rales  Chest wall:    No tenderness to palpitation  Heart:    Regular rate and rhythm without murmurs, gallops, rubs  Abdomen:     Soft, non-tender, nondistended, normal bowel sounds, no organomegaly  Genitalia:    deferred  Rectal:    deferred  Extremities:   No clubbing, cyanosis or edema.  Pulses:   2+ and symmetric all extremities  Skin:   Skin color, texture, turgor normal, no rashes or lesions  Lymph nodes:   Cervical, supraclavicular, and axillary nodes normal  Neurologic:   CNII-XII intact. Normal strength, sensation and reflexes      throughout    Labs on Admission:  Basic Metabolic Panel:  Recent Labs Lab 01/01/14 1759  NA 140  K 4.4  CL 100  CO2 26  GLUCOSE 92  BUN 14  CREATININE 1.17*  CALCIUM 9.5   Liver Function Tests: No results for input(s): AST, ALT, ALKPHOS, BILITOT, PROT, ALBUMIN in the last 168 hours. No results for input(s): LIPASE, AMYLASE in the last 168 hours. No results for input(s): AMMONIA in the last 168 hours. CBC:  Recent Labs Lab 01/01/14 1759  WBC 9.7  HGB 10.5*  HCT 32.9*  MCV 84.6  PLT 396   Cardiac Enzymes: No results for input(s): CKTOTAL, CKMB, CKMBINDEX, TROPONINI in the last 168 hours.  BNP (last 3 results)  Recent Labs  01/01/14 1759  PROBNP 18.8   CBG: No results for input(s): GLUCAP in the last 168 hours.  Radiological Exams on Admission: Dg Chest Port 1 View  01/01/2014   CLINICAL DATA:  Chest pain and shortness of breath.  EXAM: PORTABLE CHEST - 1 VIEW  COMPARISON:  12/12/2012  FINDINGS: The heart size and mediastinal contours are within normal limits. Cardiac leads project of the chest. Both lungs are clear. The visualized skeletal structures are unremarkable.  IMPRESSION: No acute cardiopulmonary disease.   Electronically  Signed   By: Cheyenne Gould M.D.   On: 01/01/2014 18:38    EKG: Independently reviewed.  Assessment/Plan Principal Problem:   Chest pain   1. Chest pain - HEART score = 6 with a good sounding story, 3 risk factors, and age. 1. Chest pain obs protocol 2. Serial trops 3. Tele monitor 4. Call cards in AM, suspect patient likely will need stress test given elevated HEART score.    Code Status: Full Code  Family Communication: No family in room Disposition Plan: Admit to obs   Time spent: 50 min  GARDNER, JARED M. [facility] Pager [phone]  If 7AM-7PM, please contact the day team taking  care of the patient [email or web address] Password [email or web address] 01/01/2014, 8:10 PM

## 2014-01-02 ENCOUNTER — Other Ambulatory Visit: Payer: Self-pay

## 2014-01-02 ENCOUNTER — Observation Stay (HOSPITAL_COMMUNITY)
Admission: EM | Admit: 2014-01-02 | Discharge: 2014-01-02 | Disposition: A | Discharge status: Home / Self Care | Payer: Medicare Other | Source: Home / Self Care | Attending: Cardiovascular Disease | Admitting: Cardiovascular Disease

## 2014-01-02 ENCOUNTER — Ambulatory Visit (HOSPITAL_COMMUNITY)
Admit: 2014-01-02 | Discharge: 2014-01-02 | Disposition: A | Discharge status: Home / Self Care | Payer: Medicare Other | Source: Ambulatory Visit | Attending: Cardiovascular Disease | Admitting: Cardiovascular Disease

## 2014-01-02 DIAGNOSIS — R079 Chest pain, unspecified: Secondary | ICD-10-CM

## 2014-01-02 DIAGNOSIS — R0789 Other chest pain: Secondary | ICD-10-CM | POA: Diagnosis not present

## 2014-01-02 LAB — BASIC METABOLIC PANEL
Anion gap: 11 (ref 5–15)
BUN: 14 mg/dL (ref 6–23)
CO2: 26 mEq/L (ref 19–32)
Calcium: 9.3 mg/dL (ref 8.4–10.5)
Chloride: 104 mEq/L (ref 96–112)
Creatinine, Ser: 1.02 mg/dL (ref 0.50–1.10)
GFR calc Af Amer: 65 mL/min — ABNORMAL LOW (ref >90)
GFR calc non Af Amer: 56 mL/min — ABNORMAL LOW (ref >90)
Glucose, Bld: 91 mg/dL (ref 70–99)
Potassium: 4.2 mEq/L (ref 3.7–5.3)
Sodium: 141 mEq/L (ref 137–147)

## 2014-01-02 LAB — CBC
HCT: 30.5 % — ABNORMAL LOW (ref 36.0–46.0)
Hemoglobin: 10 g/dL — ABNORMAL LOW (ref 12.0–15.0)
MCH: 26.9 pg (ref 26.0–34.0)
MCHC: 32.8 g/dL (ref 30.0–36.0)
MCV: 82 fL (ref 78.0–100.0)
Platelets: 373 10*3/uL (ref 150–400)
RBC: 3.72 MIL/uL — ABNORMAL LOW (ref 3.87–5.11)
RDW: 14.9 % (ref 11.5–15.5)
WBC: 6.8 10*3/uL (ref 4.0–10.5)

## 2014-01-02 LAB — LIPID PANEL
Cholesterol: 209 mg/dL — ABNORMAL HIGH (ref 0–200)
HDL: 78 mg/dL (ref >39)
LDL Cholesterol: 122 mg/dL — ABNORMAL HIGH (ref 0–99)
Total CHOL/HDL Ratio: 2.7 RATIO
Triglycerides: 47 mg/dL (ref <150)
VLDL: 9 mg/dL (ref 0–40)

## 2014-01-02 LAB — TROPONIN I: Troponin I: <0.3 ng/mL (ref <0.30)

## 2014-01-02 MED ORDER — REGADENOSON 0.4 MG/5ML IV SOLN
INTRAVENOUS | Status: AC
  Filled 2014-01-02: qty 5

## 2014-01-02 MED ORDER — TECHNETIUM TC 99M SESTAMIBI GENERIC - CARDIOLITE
10.0000 | Freq: Once | INTRAVENOUS | Status: AC | PRN
  Administered 2014-01-02: 10 via INTRAVENOUS

## 2014-01-02 MED ORDER — SIMVASTATIN 20 MG PO TABS
20.0000 mg | ORAL_TABLET | Freq: Every day | ORAL | Status: DC
  Filled 2014-01-02: qty 1

## 2014-01-02 MED ORDER — TECHNETIUM TC 99M SESTAMIBI - CARDIOLITE
30.0000 | Freq: Once | INTRAVENOUS | Status: AC | PRN
  Administered 2014-01-02: 30 via INTRAVENOUS

## 2014-01-02 MED ORDER — REGADENOSON 0.4 MG/5ML IV SOLN
0.4000 mg | Freq: Once | INTRAVENOUS | Status: AC
  Administered 2014-01-02: 0.4 mg via INTRAVENOUS
  Filled 2014-01-02: qty 5

## 2014-01-02 NOTE — Progress Notes (Signed)
   Admitted by [facility] with chest pain.  MI ruled out.  Myoview arranged.  Lexiscan Myoview  Lexiscan Injected. ECG: diffuse TWI with injection Tolerated well.  Noted chest pressure. Images pending. Signed,  Tereso Newcomer, PA-C   01/02/2014 11:27 AM

## 2014-01-02 NOTE — Progress Notes (Signed)
[facility] PROGRESS NOTE  Azuri Bozard QVZ:[id number] DOB: [birth year] DOA: 01/01/2014 PCP: Geraldo Pitter, MD  Assessment/Plan: 1-Chest pain:  Troponin times 3 negative, EKG sinus rhythm no ST elevation.  On aspirin and nitroglycerin PRN.  Check ECHO. Lipid panel.  Stress test today. Follow result to see if patient can be discharge.   2-HTN;Hold Norvasc and irbesartan SBP in the 100 range.   3-RA; on [place]   Code Status: Full Code.  Family Communication:  Disposition Plan: home at time of discharge   Consultants:  Cardiology  Procedures:  none  Antibiotics:  none  HPI/Subjective: Feeling better, chest pain free.   Objective: Filed Vitals:   01/02/14 0640  BP: 106/61  Pulse: 80  Temp: 98.6 F (37 C)  Resp: 18    Intake/Output Summary (Last 24 hours) at 01/02/14 0834 Last data filed at 01/02/14 0134  Gross per 24 hour  Intake    120 ml  Output   1225 ml  Net  -1105 ml   Filed Weights   01/01/14 2055  Weight: 79.8 kg (175 lb 14.8 oz)    Exam:   General:  Alert in no distress.   Cardiovascular: S 1, S 2 RRR  Respiratory: CTA  Abdomen: BS present, soft, NT  Musculoskeletal:no edema.   Data Reviewed: Basic Metabolic Panel:  Recent Labs Lab 01/01/14 1759  NA 140  K 4.4  CL 100  CO2 26  GLUCOSE 92  BUN 14  CREATININE 1.17*  CALCIUM 9.5   Liver Function Tests: No results for input(s): AST, ALT, ALKPHOS, BILITOT, PROT, ALBUMIN in the last 168 hours. No results for input(s): LIPASE, AMYLASE in the last 168 hours. No results for input(s): AMMONIA in the last 168 hours. CBC:  Recent Labs Lab 01/01/14 1759  WBC 9.7  HGB 10.5*  HCT 32.9*  MCV 84.6  PLT 396   Cardiac Enzymes:  Recent Labs Lab 01/01/14 2010 01/01/14 2310 01/02/14 0212  TROPONINI <0.30 <0.30 <0.30   BNP (last 3 results)  Recent Labs  01/01/14 1759  PROBNP 18.8   CBG: No results for input(s): GLUCAP in the last 168 hours.  No results found  for this or any previous visit (from the past 240 hour(s)).   Studies: Dg Chest Port 1 View  01/01/2014   CLINICAL DATA:  Chest pain and shortness of breath.  EXAM: PORTABLE CHEST - 1 VIEW  COMPARISON:  12/12/2012  FINDINGS: The heart size and mediastinal contours are within normal limits. Cardiac leads project of the chest. Both lungs are clear. The visualized skeletal structures are unremarkable.  IMPRESSION: No acute cardiopulmonary disease.   Electronically Signed   By: Britta Mccreedy M.D.   On: 01/01/2014 18:38    Scheduled Meds: . amLODipine  5 mg Oral Daily   And  . irbesartan  150 mg Oral Daily  . cetirizine  5 mg Oral QHS  . heparin  5,000 Units Subcutaneous 3 times per day  . leflunomide  20 mg Oral Daily  . pantoprazole  80 mg Oral Q1200   Continuous Infusions:   Principal Problem:   Chest pain    Time spent: 35 minutes.     Hartley Barefoot A  [facility] Pager [phone]. If 7PM-7AM, please contact night-coverage at [email or web address], password [email or web address] 01/02/2014, 8:34 AM  LOS: 1 day

## 2014-01-02 NOTE — Discharge Summary (Signed)
Physician Discharge Summary  Cheyenne Gould XFG:[id number] DOB: [birth year] DOA: 01/01/2014  PCP: Geraldo Pitter, MD  Admit date: 01/01/2014 Discharge date: 01/02/2014  Time spent: 35 minutes  Recommendations for Outpatient Follow-up:  1. Follow up with PCP for further evaluation of chest pain if reoccurs.  2. Follow up with PCP to check BP and adjust medications as need 3. Need repeat Lipid panel.   Discharge Diagnoses:    Chest pain non cardiac.    HTN   RA.   Discharge Condition: stable.   Diet recommendation: Heart Healthy.   Filed Weights   01/01/14 2055  Weight: 79.8 kg (175 lb 14.8 oz)    History of present illness:  HPI: Cheyenne Gould is a 68 y.o. female h/o HTN, HLD, RA, GERD, presents to ED with chest pain. Located in central chest, onset 24 hours ago, occurs intermittently. Pressure like quality, no radiation. Associated with SOB, fatigue, lightheadedness, diaphoresis, and nausea. No vomiting. Occurs independent of exertion, was standing more than normal at work today. No prior personal cardiac history, last stress test some 30 years ago she states. Very strong family history of CAD (father, paternal grandmother). Patient is former smoker as well. CP is drastically different than GERD symptoms.  Hospital Course:  1-Chest pain: resolved. Could be related to esophageal spasm.  Troponin times 3 negative, EKG sinus rhythm no ST elevation.  On aspirin and nitroglycerin PRN.  Lipid panel. LDL 122. Patient wants to work on diet.  Stress test no evidence of ischemia, low risk.   2-HTN;Hold Norvasc and irbesartan SBP in the 100 range. Patient will check BP at home. If BP increase she was advised to resume medications.   3-RA; on Arava   Procedures:  Stress test; low risk, no reversible ischemia.   Consultations:  Cardiology  Discharge Exam: Filed Vitals:   01/02/14 0640  BP: 106/61  Pulse: 80  Temp: 98.6 F (37 C)  Resp: 18    General: Alert in  no distress.  Cardiovascular: S 1, S 2 RRR Respiratory: CTA  Discharge Instructions You were cared for by a hospitalist during your hospital stay. If you have any questions about your discharge medications or the care you received while you were in the hospital after you are discharged, you can call the unit and asked to speak with the hospitalist on call if the hospitalist that took care of you is not available. Once you are discharged, your primary care physician will handle any further medical issues. Please note that NO REFILLS for any discharge medications will be authorized once you are discharged, as it is imperative that you return to your primary care physician (or establish a relationship with a primary care physician if you do not have one) for your aftercare needs so that they can reassess your need for medications and monitor your lab values.   Current Discharge Medication List    CONTINUE these medications which have NOT CHANGED   Details  acetaminophen (TYLENOL) 500 MG tablet Take 500 mg by mouth every 6 (six) hours as needed for mild pain or headache.     amLODipine-valsartan (EXFORGE) 5-160 MG per tablet Take 1 tablet by mouth daily.    clindamycin (CLEOCIN) 300 MG capsule Take 600 mg by mouth. Two hours prior to dental work.    fluticasone (FLONASE) 50 MCG/ACT nasal spray Place 2 sprays into both nostrils daily as needed.     HYDROcodone-acetaminophen (NORCO/VICODIN) 5-325 MG per tablet Take 1 tablet by mouth every 6 (six)  hours as needed for moderate pain.     leflunomide (ARAVA) 20 MG tablet Take 20 mg by mouth daily.      levocetirizine (XYZAL) 5 MG tablet Take 5 mg by mouth at bedtime.     Menthol, Topical Analgesic, (BIOFREEZE EX) Apply 1 application topically daily as needed (knee pain.).    NEXIUM 40 MG capsule Take 40 mg by mouth daily.     OVER THE COUNTER MEDICATION Take 1 each by mouth daily. Dental Pre-wash    sucralfate (CARAFATE) 1 G tablet Take 1 g by  mouth 4 (four) times daily as needed.     traMADol (ULTRAM) 50 MG tablet Take 50 mg by mouth every 6 (six) hours as needed.     VITAMIN D, ERGOCALCIFEROL, PO Take 1 capsule by mouth daily.        Allergies  Allergen Reactions  . Peanut-Containing Drug Products Anaphylaxis  . Lyrica [Pregabalin]   . Penicillins   . Shellfish Allergy Nausea And Vomiting  . Sulfa Antibiotics       The results of significant diagnostics from this hospitalization (including imaging, microbiology, ancillary and laboratory) are listed below for reference.    Significant Diagnostic Studies: Nm Myocar Multi W/spect W/wall Motion / Ef  01/02/2014   CLINICAL DATA:  Chest pain, unspecified  EXAM: MYOCARDIAL IMAGING WITH SPECT (REST AND PHARMACOLOGIC-STRESS)  GATED LEFT VENTRICULAR WALL MOTION STUDY  LEFT VENTRICULAR EJECTION FRACTION  TECHNIQUE: Standard myocardial SPECT imaging was performed after resting intravenous injection of 10 mCi Tc-66m sestamibi. Subsequently, intravenous infusion of Lexiscan was performed under the supervision of the Cardiology staff. At peak effect of the drug, 30 mCi Tc-57m sestamibi was injected intravenously and standard myocardial SPECT imaging was performed. Quantitative gated imaging was also performed to evaluate left ventricular wall motion, and estimate left ventricular ejection fraction.  COMPARISON:  None.  FINDINGS: Perfusion: No decreased activity in the left ventricle on stress imaging to suggest reversible ischemia or infarction.  Wall Motion: Normal left ventricular wall motion. No left ventricular dilation.  Left Ventricular Ejection Fraction: 84 %  End diastolic volume 50 ml  End systolic volume 8 ml  IMPRESSION: 1. No reversible ischemia or infarction.  2. Normal left ventricular wall motion.  3. Left ventricular ejection fraction 84%  4. Low-risk stress test findings*.  *2012 Appropriate Use Criteria for Coronary Revascularization Focused Update: J Am Coll Cardiol.  2012;59(9):857-881. http://content.dementiazones.com.aspx?articleid=1201161   Electronically Signed   By: Ruel Favors M.D.   On: 01/02/2014 13:40   Dg Chest Port 1 View  01/01/2014   CLINICAL DATA:  Chest pain and shortness of breath.  EXAM: PORTABLE CHEST - 1 VIEW  COMPARISON:  12/12/2012  FINDINGS: The heart size and mediastinal contours are within normal limits. Cardiac leads project of the chest. Both lungs are clear. The visualized skeletal structures are unremarkable.  IMPRESSION: No acute cardiopulmonary disease.   Electronically Signed   By: Britta Mccreedy M.D.   On: 01/01/2014 18:38    Microbiology: No results found for this or any previous visit (from the past 240 hour(s)).   Labs: Basic Metabolic Panel:  Recent Labs Lab 01/01/14 1759 01/02/14 0858  NA 140 141  K 4.4 4.2  CL 100 104  CO2 26 26  GLUCOSE 92 91  BUN 14 14  CREATININE 1.17* 1.02  CALCIUM 9.5 9.3   Liver Function Tests: No results for input(s): AST, ALT, ALKPHOS, BILITOT, PROT, ALBUMIN in the last 168 hours. No results for input(s): LIPASE,  AMYLASE in the last 168 hours. No results for input(s): AMMONIA in the last 168 hours. CBC:  Recent Labs Lab 01/01/14 1759 01/02/14 0858  WBC 9.7 6.8  HGB 10.5* 10.0*  HCT 32.9* 30.5*  MCV 84.6 82.0  PLT 396 373   Cardiac Enzymes:  Recent Labs Lab 01/01/14 2010 01/01/14 2310 01/02/14 0212  TROPONINI <0.30 <0.30 <0.30   BNP: BNP (last 3 results)  Recent Labs  01/01/14 1759  PROBNP 18.8   CBG: No results for input(s): GLUCAP in the last 168 hours.     Signed:  Hartley Barefoot A  [facility] 01/02/2014, 2:57 PM

## 2014-01-02 NOTE — Discharge Instructions (Signed)
Check your Blood pressure. If BP more than 130, resume Blood pressure medications.

## 2014-01-02 NOTE — Progress Notes (Signed)
Pt admitted to the unit and ambulated to bed with little assistance. Oriented to room, call light, and staff. VSS at this time. No distress noted. Will continue to monitor pt and carry out POC.

## 2014-01-02 NOTE — Plan of Care (Signed)
Problem: Phase I Progression Outcomes Goal: Hemodynamically stable Outcome: Completed/Met Date Met:  01/02/14 Goal: Anginal pain relieved Outcome: Completed/Met Date Met:  01/02/14 Goal: Aspirin unless contraindicated Outcome: Completed/Met Date Met:  01/02/14 Goal: Voiding-avoid urinary catheter unless indicated Outcome: Completed/Met Date Met:  01/02/14 Goal: Other Phase I Outcomes/Goals Outcome: Not Applicable Date Met:  98/47/30

## 2014-01-02 NOTE — Progress Notes (Signed)
UR completed 

## 2014-01-06 DIAGNOSIS — M5136 Other intervertebral disc degeneration, lumbar region: Secondary | ICD-10-CM | POA: Diagnosis not present

## 2014-01-06 DIAGNOSIS — M150 Primary generalized (osteo)arthritis: Secondary | ICD-10-CM | POA: Diagnosis not present

## 2014-01-06 DIAGNOSIS — M0589 Other rheumatoid arthritis with rheumatoid factor of multiple sites: Secondary | ICD-10-CM | POA: Diagnosis not present

## 2014-02-03 DIAGNOSIS — H25813 Combined forms of age-related cataract, bilateral: Secondary | ICD-10-CM | POA: Diagnosis not present

## 2014-02-03 DIAGNOSIS — H43811 Vitreous degeneration, right eye: Secondary | ICD-10-CM | POA: Diagnosis not present

## 2014-02-06 DIAGNOSIS — M940 Chondrocostal junction syndrome [Tietze]: Secondary | ICD-10-CM | POA: Diagnosis not present

## 2014-02-06 DIAGNOSIS — K219 Gastro-esophageal reflux disease without esophagitis: Secondary | ICD-10-CM | POA: Diagnosis not present

## 2014-03-10 DIAGNOSIS — K219 Gastro-esophageal reflux disease without esophagitis: Secondary | ICD-10-CM | POA: Diagnosis not present

## 2014-03-10 DIAGNOSIS — M940 Chondrocostal junction syndrome [Tietze]: Secondary | ICD-10-CM | POA: Diagnosis not present

## 2014-03-10 DIAGNOSIS — I10 Essential (primary) hypertension: Secondary | ICD-10-CM | POA: Diagnosis not present

## 2014-03-25 DIAGNOSIS — L089 Local infection of the skin and subcutaneous tissue, unspecified: Secondary | ICD-10-CM | POA: Diagnosis not present

## 2014-04-08 DIAGNOSIS — M150 Primary generalized (osteo)arthritis: Secondary | ICD-10-CM | POA: Diagnosis not present

## 2014-04-08 DIAGNOSIS — M0589 Other rheumatoid arthritis with rheumatoid factor of multiple sites: Secondary | ICD-10-CM | POA: Diagnosis not present

## 2014-04-08 DIAGNOSIS — M5136 Other intervertebral disc degeneration, lumbar region: Secondary | ICD-10-CM | POA: Diagnosis not present

## 2014-04-14 DIAGNOSIS — M545 Low back pain: Secondary | ICD-10-CM | POA: Diagnosis not present

## 2014-04-14 DIAGNOSIS — R293 Abnormal posture: Secondary | ICD-10-CM | POA: Diagnosis not present

## 2014-04-14 DIAGNOSIS — M5416 Radiculopathy, lumbar region: Secondary | ICD-10-CM | POA: Diagnosis not present

## 2014-04-14 DIAGNOSIS — M5116 Intervertebral disc disorders with radiculopathy, lumbar region: Secondary | ICD-10-CM | POA: Diagnosis not present

## 2014-04-15 DIAGNOSIS — L03114 Cellulitis of left upper limb: Secondary | ICD-10-CM | POA: Diagnosis not present

## 2014-04-15 DIAGNOSIS — L02512 Cutaneous abscess of left hand: Secondary | ICD-10-CM | POA: Diagnosis not present

## 2014-04-20 DIAGNOSIS — M5416 Radiculopathy, lumbar region: Secondary | ICD-10-CM | POA: Diagnosis not present

## 2014-04-20 DIAGNOSIS — M5116 Intervertebral disc disorders with radiculopathy, lumbar region: Secondary | ICD-10-CM | POA: Diagnosis not present

## 2014-04-20 DIAGNOSIS — R293 Abnormal posture: Secondary | ICD-10-CM | POA: Diagnosis not present

## 2014-04-20 DIAGNOSIS — M545 Low back pain: Secondary | ICD-10-CM | POA: Diagnosis not present

## 2014-04-21 DIAGNOSIS — R293 Abnormal posture: Secondary | ICD-10-CM | POA: Diagnosis not present

## 2014-04-21 DIAGNOSIS — M545 Low back pain: Secondary | ICD-10-CM | POA: Diagnosis not present

## 2014-04-21 DIAGNOSIS — M5416 Radiculopathy, lumbar region: Secondary | ICD-10-CM | POA: Diagnosis not present

## 2014-04-21 DIAGNOSIS — M5116 Intervertebral disc disorders with radiculopathy, lumbar region: Secondary | ICD-10-CM | POA: Diagnosis not present

## 2014-04-22 DIAGNOSIS — L03114 Cellulitis of left upper limb: Secondary | ICD-10-CM | POA: Diagnosis not present

## 2014-04-23 DIAGNOSIS — M5416 Radiculopathy, lumbar region: Secondary | ICD-10-CM | POA: Diagnosis not present

## 2014-04-23 DIAGNOSIS — R293 Abnormal posture: Secondary | ICD-10-CM | POA: Diagnosis not present

## 2014-04-23 DIAGNOSIS — M545 Low back pain: Secondary | ICD-10-CM | POA: Diagnosis not present

## 2014-04-23 DIAGNOSIS — M5116 Intervertebral disc disorders with radiculopathy, lumbar region: Secondary | ICD-10-CM | POA: Diagnosis not present

## 2014-04-27 DIAGNOSIS — M5116 Intervertebral disc disorders with radiculopathy, lumbar region: Secondary | ICD-10-CM | POA: Diagnosis not present

## 2014-04-27 DIAGNOSIS — M545 Low back pain: Secondary | ICD-10-CM | POA: Diagnosis not present

## 2014-04-27 DIAGNOSIS — R293 Abnormal posture: Secondary | ICD-10-CM | POA: Diagnosis not present

## 2014-04-27 DIAGNOSIS — M5416 Radiculopathy, lumbar region: Secondary | ICD-10-CM | POA: Diagnosis not present

## 2014-04-28 DIAGNOSIS — M545 Low back pain: Secondary | ICD-10-CM | POA: Diagnosis not present

## 2014-04-28 DIAGNOSIS — R293 Abnormal posture: Secondary | ICD-10-CM | POA: Diagnosis not present

## 2014-04-28 DIAGNOSIS — M5416 Radiculopathy, lumbar region: Secondary | ICD-10-CM | POA: Diagnosis not present

## 2014-04-28 DIAGNOSIS — M5116 Intervertebral disc disorders with radiculopathy, lumbar region: Secondary | ICD-10-CM | POA: Diagnosis not present

## 2014-05-05 DIAGNOSIS — M5116 Intervertebral disc disorders with radiculopathy, lumbar region: Secondary | ICD-10-CM | POA: Diagnosis not present

## 2014-05-05 DIAGNOSIS — R293 Abnormal posture: Secondary | ICD-10-CM | POA: Diagnosis not present

## 2014-05-05 DIAGNOSIS — M545 Low back pain: Secondary | ICD-10-CM | POA: Diagnosis not present

## 2014-05-05 DIAGNOSIS — M5416 Radiculopathy, lumbar region: Secondary | ICD-10-CM | POA: Diagnosis not present

## 2014-05-06 DIAGNOSIS — M545 Low back pain: Secondary | ICD-10-CM | POA: Diagnosis not present

## 2014-05-06 DIAGNOSIS — M5116 Intervertebral disc disorders with radiculopathy, lumbar region: Secondary | ICD-10-CM | POA: Diagnosis not present

## 2014-05-06 DIAGNOSIS — R293 Abnormal posture: Secondary | ICD-10-CM | POA: Diagnosis not present

## 2014-05-06 DIAGNOSIS — M5416 Radiculopathy, lumbar region: Secondary | ICD-10-CM | POA: Diagnosis not present

## 2014-05-10 DIAGNOSIS — M545 Low back pain: Secondary | ICD-10-CM | POA: Diagnosis not present

## 2014-05-10 DIAGNOSIS — M5116 Intervertebral disc disorders with radiculopathy, lumbar region: Secondary | ICD-10-CM | POA: Diagnosis not present

## 2014-05-10 DIAGNOSIS — M5416 Radiculopathy, lumbar region: Secondary | ICD-10-CM | POA: Diagnosis not present

## 2014-05-10 DIAGNOSIS — R293 Abnormal posture: Secondary | ICD-10-CM | POA: Diagnosis not present

## 2014-05-12 DIAGNOSIS — I10 Essential (primary) hypertension: Secondary | ICD-10-CM | POA: Diagnosis not present

## 2014-05-12 DIAGNOSIS — M05441 Rheumatoid myopathy with rheumatoid arthritis of right hand: Secondary | ICD-10-CM | POA: Diagnosis not present

## 2014-05-12 DIAGNOSIS — D6489 Other specified anemias: Secondary | ICD-10-CM | POA: Diagnosis not present

## 2014-05-12 DIAGNOSIS — M0549 Rheumatoid myopathy with rheumatoid arthritis of multiple sites: Secondary | ICD-10-CM | POA: Diagnosis not present

## 2014-05-13 DIAGNOSIS — L080 Pyoderma: Secondary | ICD-10-CM | POA: Diagnosis not present

## 2014-06-14 DIAGNOSIS — Z91010 Allergy to peanuts: Secondary | ICD-10-CM | POA: Diagnosis not present

## 2014-06-14 DIAGNOSIS — Z91018 Allergy to other foods: Secondary | ICD-10-CM | POA: Diagnosis not present

## 2014-06-14 DIAGNOSIS — Z91013 Allergy to seafood: Secondary | ICD-10-CM | POA: Diagnosis not present

## 2014-06-14 DIAGNOSIS — R21 Rash and other nonspecific skin eruption: Secondary | ICD-10-CM | POA: Diagnosis not present

## 2014-06-17 DIAGNOSIS — L131 Subcorneal pustular dermatitis: Secondary | ICD-10-CM | POA: Diagnosis not present

## 2014-06-17 DIAGNOSIS — L309 Dermatitis, unspecified: Secondary | ICD-10-CM | POA: Diagnosis not present

## 2014-06-23 DIAGNOSIS — K219 Gastro-esophageal reflux disease without esophagitis: Secondary | ICD-10-CM | POA: Diagnosis not present

## 2014-06-23 DIAGNOSIS — K259 Gastric ulcer, unspecified as acute or chronic, without hemorrhage or perforation: Secondary | ICD-10-CM | POA: Diagnosis not present

## 2014-06-23 DIAGNOSIS — K625 Hemorrhage of anus and rectum: Secondary | ICD-10-CM | POA: Diagnosis not present

## 2014-07-08 DIAGNOSIS — M0589 Other rheumatoid arthritis with rheumatoid factor of multiple sites: Secondary | ICD-10-CM | POA: Diagnosis not present

## 2014-07-08 DIAGNOSIS — L309 Dermatitis, unspecified: Secondary | ICD-10-CM | POA: Diagnosis not present

## 2014-07-08 DIAGNOSIS — M150 Primary generalized (osteo)arthritis: Secondary | ICD-10-CM | POA: Diagnosis not present

## 2014-07-08 DIAGNOSIS — M5136 Other intervertebral disc degeneration, lumbar region: Secondary | ICD-10-CM | POA: Diagnosis not present

## 2014-07-08 DIAGNOSIS — M2580 Other specified joint disorders, unspecified joint: Secondary | ICD-10-CM | POA: Diagnosis not present

## 2014-07-29 DIAGNOSIS — L309 Dermatitis, unspecified: Secondary | ICD-10-CM | POA: Diagnosis not present

## 2014-08-04 DIAGNOSIS — M940 Chondrocostal junction syndrome [Tietze]: Secondary | ICD-10-CM | POA: Diagnosis not present

## 2014-08-13 DIAGNOSIS — M5136 Other intervertebral disc degeneration, lumbar region: Secondary | ICD-10-CM | POA: Diagnosis not present

## 2014-08-13 DIAGNOSIS — L309 Dermatitis, unspecified: Secondary | ICD-10-CM | POA: Diagnosis not present

## 2014-08-13 DIAGNOSIS — M150 Primary generalized (osteo)arthritis: Secondary | ICD-10-CM | POA: Diagnosis not present

## 2014-08-13 DIAGNOSIS — M0589 Other rheumatoid arthritis with rheumatoid factor of multiple sites: Secondary | ICD-10-CM | POA: Diagnosis not present

## 2014-08-25 DIAGNOSIS — T7840XS Allergy, unspecified, sequela: Secondary | ICD-10-CM | POA: Diagnosis not present

## 2014-08-25 DIAGNOSIS — I10 Essential (primary) hypertension: Secondary | ICD-10-CM | POA: Diagnosis not present

## 2014-08-25 DIAGNOSIS — K219 Gastro-esophageal reflux disease without esophagitis: Secondary | ICD-10-CM | POA: Diagnosis not present

## 2014-08-25 DIAGNOSIS — M0549 Rheumatoid myopathy with rheumatoid arthritis of multiple sites: Secondary | ICD-10-CM | POA: Diagnosis not present

## 2014-09-04 ENCOUNTER — Emergency Department (HOSPITAL_COMMUNITY)
Admission: EM | Admit: 2014-09-04 | Discharge: 2014-09-04 | Disposition: A | Discharge status: Home / Self Care | Payer: Medicare Other | Attending: Emergency Medicine | Admitting: Emergency Medicine

## 2014-09-04 DIAGNOSIS — Z87891 Personal history of nicotine dependence: Secondary | ICD-10-CM | POA: Insufficient documentation

## 2014-09-04 DIAGNOSIS — I10 Essential (primary) hypertension: Secondary | ICD-10-CM | POA: Insufficient documentation

## 2014-09-04 DIAGNOSIS — Z8709 Personal history of other diseases of the respiratory system: Secondary | ICD-10-CM | POA: Insufficient documentation

## 2014-09-04 DIAGNOSIS — Z880 Allergy status to penicillin: Secondary | ICD-10-CM | POA: Diagnosis not present

## 2014-09-04 DIAGNOSIS — M79604 Pain in right leg: Secondary | ICD-10-CM | POA: Diagnosis present

## 2014-09-04 DIAGNOSIS — M069 Rheumatoid arthritis, unspecified: Secondary | ICD-10-CM | POA: Diagnosis not present

## 2014-09-04 DIAGNOSIS — M5431 Sciatica, right side: Secondary | ICD-10-CM

## 2014-09-04 DIAGNOSIS — K219 Gastro-esophageal reflux disease without esophagitis: Secondary | ICD-10-CM | POA: Diagnosis not present

## 2014-09-04 DIAGNOSIS — Z862 Personal history of diseases of the blood and blood-forming organs and certain disorders involving the immune mechanism: Secondary | ICD-10-CM | POA: Insufficient documentation

## 2014-09-04 DIAGNOSIS — Z79899 Other long term (current) drug therapy: Secondary | ICD-10-CM | POA: Diagnosis not present

## 2014-09-04 DIAGNOSIS — Z8639 Personal history of other endocrine, nutritional and metabolic disease: Secondary | ICD-10-CM | POA: Insufficient documentation

## 2014-09-04 MED ORDER — DEXAMETHASONE SODIUM PHOSPHATE 10 MG/ML IJ SOLN
6.0000 mg | Freq: Once | INTRAMUSCULAR | Status: AC
  Administered 2014-09-04: 6 mg via INTRAMUSCULAR
  Filled 2014-09-04: qty 1

## 2014-09-04 MED ORDER — OXYCODONE-ACETAMINOPHEN 5-325 MG PO TABS: 2.0000 | ORAL_TABLET | Freq: Once | ORAL | Status: DC

## 2014-09-04 MED ORDER — MORPHINE SULFATE 4 MG/ML IJ SOLN
4.0000 mg | Freq: Once | INTRAMUSCULAR | Status: AC
  Administered 2014-09-04: 4 mg via INTRAMUSCULAR
  Filled 2014-09-04: qty 1

## 2014-09-04 MED ORDER — OXYCODONE-ACETAMINOPHEN 5-325 MG PO TABS: 1.0000 | ORAL_TABLET | ORAL | Status: DC | PRN

## 2014-09-04 NOTE — Discharge Instructions (Signed)
Sciatica Sciatica is pain, weakness, numbness, or tingling along the path of the sciatic nerve. The nerve starts in the lower back and runs down the back of each leg. The nerve controls the muscles in the lower leg and in the back of the knee, while also providing sensation to the back of the thigh, lower leg, and the sole of your foot. Sciatica is a symptom of another medical condition. For instance, nerve damage or certain conditions, such as a herniated disk or bone spur on the spine, pinch or put pressure on the sciatic nerve. This causes the pain, weakness, or other sensations normally associated with sciatica. Generally, sciatica only affects one side of the body. CAUSES   Herniated or slipped disc.  Degenerative disk disease.  A pain disorder involving the narrow muscle in the buttocks (piriformis syndrome).  Pelvic injury or fracture.  Pregnancy.  Tumor (rare). SYMPTOMS  Symptoms can vary from mild to very severe. The symptoms usually travel from the low back to the buttocks and down the back of the leg. Symptoms can include:  Mild tingling or dull aches in the lower back, leg, or hip.  Numbness in the back of the calf or sole of the foot.  Burning sensations in the lower back, leg, or hip.  Sharp pains in the lower back, leg, or hip.  Leg weakness.  Severe back pain inhibiting movement. These symptoms may get worse with coughing, sneezing, laughing, or prolonged sitting or standing. Also, being overweight may worsen symptoms. DIAGNOSIS  Your caregiver will perform a physical exam to look for common symptoms of sciatica. He or she may ask you to do certain movements or activities that would trigger sciatic nerve pain. Other tests may be performed to find the cause of the sciatica. These may include:  Blood tests.  X-rays.  Imaging tests, such as an MRI or CT scan. TREATMENT  Treatment is directed at the cause of the sciatic pain. Sometimes, treatment is not necessary  and the pain and discomfort goes away on its own. If treatment is needed, your caregiver may suggest:  Over-the-counter medicines to relieve pain.  Prescription medicines, such as anti-inflammatory medicine, muscle relaxants, or narcotics.  Applying heat or ice to the painful area.  Steroid injections to lessen pain, irritation, and inflammation around the nerve.  Reducing activity during periods of pain.  Exercising and stretching to strengthen your abdomen and improve flexibility of your spine. Your caregiver may suggest losing weight if the extra weight makes the back pain worse.  Physical therapy.  Surgery to eliminate what is pressing or pinching the nerve, such as a bone spur or part of a herniated disk. HOME CARE INSTRUCTIONS   Only take over-the-counter or prescription medicines for pain or discomfort as directed by your caregiver.  Apply ice to the affected area for 20 minutes, 3-4 times a day for the first 48-72 hours. Then try heat in the same way.  Exercise, stretch, or perform your usual activities if these do not aggravate your pain.  Attend physical therapy sessions as directed by your caregiver.  Keep all follow-up appointments as directed by your caregiver.  Do not wear high heels or shoes that do not provide proper support.  Check your mattress to see if it is too soft. A firm mattress may lessen your pain and discomfort. SEEK IMMEDIATE MEDICAL CARE IF:   You lose control of your bowel or bladder (incontinence).  You have increasing weakness in the lower back, pelvis, buttocks,   or legs.  You have redness or swelling of your back.  You have a burning sensation when you urinate.  You have pain that gets worse when you lie down or awakens you at night.  Your pain is worse than you have experienced in the past.  Your pain is lasting longer than 4 weeks.  You are suddenly losing weight without reason. MAKE SURE YOU:  Understand these  instructions.  Will watch your condition.  Will get help right away if you are not doing well or get worse. Document Released: 01/30/2001 Document Revised: 08/07/2011 Document Reviewed: 06/17/2011 Exit[employer] Patient Information 2015 [employer]. This information is not intended to replace advice given to you by your health care provider. Make sure you discuss any questions you have with your health care provider.  

## 2014-09-04 NOTE — ED Notes (Signed)
Pt states she has rheumatoid arthritis and recently switched medication. States she did a lot of walking yesterday and since then the pain in her right leg has increased and spread. Now she says the pain radiates from her lower back all the way down her right leg.

## 2014-09-04 NOTE — ED Provider Notes (Signed)
CSN: [id number]     Arrival date & time 09/04/14  1620 History   First MD Initiated Contact with Patient 09/04/14 1626     Chief Complaint  Patient presents with  . Leg Pain     (Consider location/radiation/quality/duration/timing/severity/associated sxs/prior Treatment) HPI   Patient is a 69 year old female with history of RA, hypertension, hyperlipidemia, reported gastric ulcer, acid reflux, who presents to the emergency department today with complaints of increasing right buttocks and right leg pain which has gradually worsened after extensive walking yesterday.  The pain is rated 8 out of 10, described as sharp and burning, radiating from her right buttocks down her right leg just past her knee.  Movement makes the pain worse. She has hydrocodone at home for RA flares, she took one last night with no improvement. She has a took a tramadol with brief improvement in her pain, that was at 1 PM today.   Patient denies any numbness, tingling, weakness in her lower extremities.  She reports that it "does not feel like bursitis."  She denies any change in bladder or bowel function, denies any back pain, neck pain, chest pain, shortness of breath.  She denies any fever, chills, sweats, IV drug use. She recently finished a long sterile taper for an allergic reaction to a medicine.  She states this to right aggravated her ulcer, she has been treating for some time with omeprazole.  It has improved since she stopped taking steroids.  Past Medical History  Diagnosis Date  . Rheumatoid arthritis(714.0)     on Leflunomide  . Hypertension   . Hyperlipidemia   . Allergic sinusitis   . Anemia   . Acid reflux    Past Surgical History  Procedure Laterality Date  . Bunionectomy  2002,12/18/2006  . Abdominal hysterectomy  1985    Partial   Family History  Problem Relation Age of Onset  . Heart disease Father   . Hypertension Sister   . Lupus Brother   . Cancer Brother     Colon   History   Substance Use Topics  . Smoking status: Former Smoker    Types: Cigarettes    Quit date: 02/20/2003  . Smokeless tobacco: Never Used  . Alcohol Use: No   OB History    No data available     Review of Systems  All other systems reviewed and are negative.     Allergies  Peanut-containing drug products; Shellfish allergy; Lyrica; Penicillins; Sulfa antibiotics; and Leflunomide  Home Medications   Prior to Admission medications   Medication Sig Start Date End Date Taking? Authorizing Provider  acetaminophen (TYLENOL) 500 MG tablet Take 500 mg by mouth every 6 (six) hours as needed for mild pain or headache.    Yes Historical Provider, MD  Amlodipine-Valsartan-HCTZ 5-160-12.5 MG TABS Take 1 tablet by mouth every morning.  08/26/14  Yes Historical Provider, MD  calcipotriene (DOVONOX) 0.005 % ointment Apply 1 application topically 2 (two) times daily.  06/18/14  Yes Historical Provider, MD  clindamycin (CLEOCIN) 300 MG capsule Take 600 mg by mouth. Two hours prior to dental work.   Yes Historical Provider, MD  clobetasol ointment (TEMOVATE) 0.05 % Apply 1 application topically 2 (two) times daily.  06/17/14  Yes Historical Provider, MD  EPIPEN 2-PAK 0.3 MG/0.3ML SOAJ injection Inject 0.3 mg into the muscle as needed (for allergic reactions).  06/14/14  Yes Historical Provider, MD  fluticasone (FLONASE) 50 MCG/ACT nasal spray Place 2 sprays into both nostrils daily as  needed.  04/19/12  Yes Historical Provider, MD  folic acid (FOLVITE) 1 MG tablet 2 TABLET BY MOUTH ONCE DAILY 08/13/14  Yes Historical Provider, MD  HYDROcodone-acetaminophen (NORCO/VICODIN) 5-325 MG per tablet Take 1 tablet by mouth every 6 (six) hours as needed for moderate pain.  08/05/12  Yes Historical Provider, MD  levocetirizine (XYZAL) 5 MG tablet Take 5 mg by mouth at bedtime.  05/21/12  Yes Historical Provider, MD  Menthol, Topical Analgesic, (BIOFREEZE EX) Apply 1 application topically daily as needed (knee pain.).   Yes  Historical Provider, MD  methotrexate (RHEUMATREX) 2.5 MG tablet Take 15 mg by mouth once a week. ON SATURDAYS, TAKE 6 TABLETS ( 15 MG ) 09/04/14  Yes Historical Provider, MD  omeprazole (PRILOSEC) 40 MG capsule Take 40 mg by mouth every morning.  08/17/14  Yes Historical Provider, MD  sucralfate (CARAFATE) 1 G tablet Take 1 g by mouth 4 (four) times daily as needed (FOR STOMACH ULCER).  05/18/12  Yes Historical Provider, MD  traMADol (ULTRAM) 50 MG tablet Take 50 mg by mouth every 6 (six) hours as needed.  06/26/12  Yes Historical Provider, MD  valACYclovir (VALTREX) 500 MG tablet Take 500 mg by mouth every evening.  08/26/14  Yes Historical Provider, MD  VITAMIN D, ERGOCALCIFEROL, PO Take 1 capsule by mouth once a week. OVER THE COUNTER DOSAGE UNKNOWN, ON FRIDAYS   Yes Historical Provider, MD  oxyCODONE-acetaminophen (PERCOCET) 5-325 MG per tablet Take 1 tablet by mouth every 4 (four) hours as needed for severe pain. 09/04/14   Danelle Berry, PA-C   BP 142/87 mmHg  Pulse 73  Temp(Src) 97.9 F (36.6 C) (Oral)  Resp 18  SpO2 100% Physical Exam  Constitutional: She is oriented to person, place, and time. She appears well-developed and well-nourished. No distress.  Well-developed well-nourished appears nontoxic and stated age, appears uncomfortable  HENT:  Head: Normocephalic and atraumatic.  Nose: Nose normal.  Mouth/Throat: Oropharynx is clear and moist. No oropharyngeal exudate.  Eyes: Conjunctivae and EOM are normal. Pupils are equal, round, and reactive to light. Right eye exhibits no discharge. Left eye exhibits no discharge. No scleral icterus.  Neck: Normal range of motion. No JVD present. No tracheal deviation present. No thyromegaly present.  Cardiovascular: Normal rate, regular rhythm, normal heart sounds and intact distal pulses.  Exam reveals no gallop and no friction rub.   No murmur heard. Pulmonary/Chest: Effort normal and breath sounds normal. No respiratory distress. She has no  wheezes. She has no rales. She exhibits no tenderness.  Abdominal: Soft. Bowel sounds are normal. She exhibits no distension and no mass. There is no tenderness. There is no rebound and no guarding.  Musculoskeletal: Normal range of motion. She exhibits no edema or tenderness.  Tender to palpation over right SI joint No tenderness to right IT No TTP from cervical to lumbar spine, no paraspinal tenderness   Lymphadenopathy:    She has no cervical adenopathy.  Neurological: She is alert and oriented to person, place, and time. She has normal strength and normal reflexes. She is not disoriented. She displays no tremor. No cranial nerve deficit or sensory deficit. She exhibits normal muscle tone. Coordination normal.  No sensory deficits in lower extremities, normal plantar and dorsiflexion bilaterally Antalgic gait  Skin: Skin is warm and dry. No rash noted. She is not diaphoretic. There is erythema. No cyanosis. No pallor. Nails show no clubbing.  Erythema and cracked dry skin bilaterally to hands  Psychiatric: She has a  normal mood and affect. Her behavior is normal. Judgment and thought content normal.  Nursing note and vitals reviewed.   ED Course  Procedures (including critical care time) Labs Review Labs Reviewed - No data to display  Imaging Review No results found.   EKG Interpretation None      MDM   Final diagnoses:  Sciatica, right    PT w/ Right buttock/right lower back pain radiating down her right leg to just beyond knee Positive SLR on right leg at 20 degrees Suspect sciatica - will treat with decadron shot IM, pt does not want to take any oral steroids due to side effects.  Pt has tramadol at home as well as hydrocodone, cannot take NSAIDS - will give morphine IM here in ED to see if it touches her pain.  Dr. Silverio Lay will also see and evaluate pt.  MDM - Normal neurological exam, no evidence of urinary incontinence or retention, pain is consistently reproducible.  There is no evidence of AAA or concern for dissection at this time.   Patient can walk but states is painful.  No loss of bowel or bladder control.  No concern for cauda equina.  No fever, night sweats, weight loss, h/o cancer, IVDU.  Pain treated here in the department with adequate improvement. RICE protocol and pain medicine indicated and discussed with patient. I have also discussed reasons to return immediately to the ER.  Patient expresses understanding and agrees with plan.  Rx for percocet given - only other pain medicine that pt does not have already.  Encouraged to f/u with PCP if pain does not gradually improve over the next several days.        Danelle Berry, PA-C 09/04/14 1852  Richardean Canal, MD 09/05/14 [phone]

## 2014-09-09 DIAGNOSIS — M5431 Sciatica, right side: Secondary | ICD-10-CM | POA: Diagnosis not present

## 2014-09-16 DIAGNOSIS — M05639 Rheumatoid arthritis of unspecified wrist with involvement of other organs and systems: Secondary | ICD-10-CM | POA: Diagnosis not present

## 2014-09-16 DIAGNOSIS — M5431 Sciatica, right side: Secondary | ICD-10-CM | POA: Diagnosis not present

## 2014-09-22 DIAGNOSIS — M5441 Lumbago with sciatica, right side: Secondary | ICD-10-CM | POA: Diagnosis not present

## 2014-09-22 DIAGNOSIS — M545 Low back pain: Secondary | ICD-10-CM | POA: Diagnosis not present

## 2014-09-29 DIAGNOSIS — M545 Low back pain: Secondary | ICD-10-CM | POA: Diagnosis not present

## 2014-09-29 DIAGNOSIS — M5441 Lumbago with sciatica, right side: Secondary | ICD-10-CM | POA: Diagnosis not present

## 2014-09-30 DIAGNOSIS — M545 Low back pain: Secondary | ICD-10-CM | POA: Diagnosis not present

## 2014-09-30 DIAGNOSIS — M5441 Lumbago with sciatica, right side: Secondary | ICD-10-CM | POA: Diagnosis not present

## 2014-10-06 DIAGNOSIS — M150 Primary generalized (osteo)arthritis: Secondary | ICD-10-CM | POA: Diagnosis not present

## 2014-10-06 DIAGNOSIS — M545 Low back pain: Secondary | ICD-10-CM | POA: Diagnosis not present

## 2014-10-06 DIAGNOSIS — R21 Rash and other nonspecific skin eruption: Secondary | ICD-10-CM | POA: Diagnosis not present

## 2014-10-06 DIAGNOSIS — M25561 Pain in right knee: Secondary | ICD-10-CM | POA: Diagnosis not present

## 2014-10-06 DIAGNOSIS — M0589 Other rheumatoid arthritis with rheumatoid factor of multiple sites: Secondary | ICD-10-CM | POA: Diagnosis not present

## 2014-10-06 DIAGNOSIS — M5441 Lumbago with sciatica, right side: Secondary | ICD-10-CM | POA: Diagnosis not present

## 2014-10-06 DIAGNOSIS — M5136 Other intervertebral disc degeneration, lumbar region: Secondary | ICD-10-CM | POA: Diagnosis not present

## 2014-10-07 DIAGNOSIS — J441 Chronic obstructive pulmonary disease with (acute) exacerbation: Secondary | ICD-10-CM | POA: Diagnosis not present

## 2014-10-07 DIAGNOSIS — M545 Low back pain: Secondary | ICD-10-CM | POA: Diagnosis not present

## 2014-10-07 DIAGNOSIS — M05639 Rheumatoid arthritis of unspecified wrist with involvement of other organs and systems: Secondary | ICD-10-CM | POA: Diagnosis not present

## 2014-10-07 DIAGNOSIS — M5441 Lumbago with sciatica, right side: Secondary | ICD-10-CM | POA: Diagnosis not present

## 2014-10-07 DIAGNOSIS — M5431 Sciatica, right side: Secondary | ICD-10-CM | POA: Diagnosis not present

## 2014-10-07 DIAGNOSIS — I10 Essential (primary) hypertension: Secondary | ICD-10-CM | POA: Diagnosis not present

## 2014-10-13 DIAGNOSIS — M545 Low back pain: Secondary | ICD-10-CM | POA: Diagnosis not present

## 2014-10-13 DIAGNOSIS — M5441 Lumbago with sciatica, right side: Secondary | ICD-10-CM | POA: Diagnosis not present

## 2014-10-14 DIAGNOSIS — M5441 Lumbago with sciatica, right side: Secondary | ICD-10-CM | POA: Diagnosis not present

## 2014-10-14 DIAGNOSIS — M545 Low back pain: Secondary | ICD-10-CM | POA: Diagnosis not present

## 2014-10-14 DIAGNOSIS — Z1231 Encounter for screening mammogram for malignant neoplasm of breast: Secondary | ICD-10-CM | POA: Diagnosis not present

## 2014-10-20 DIAGNOSIS — M5441 Lumbago with sciatica, right side: Secondary | ICD-10-CM | POA: Diagnosis not present

## 2014-10-20 DIAGNOSIS — M545 Low back pain: Secondary | ICD-10-CM | POA: Diagnosis not present

## 2014-10-21 DIAGNOSIS — M5441 Lumbago with sciatica, right side: Secondary | ICD-10-CM | POA: Diagnosis not present

## 2014-10-21 DIAGNOSIS — M545 Low back pain: Secondary | ICD-10-CM | POA: Diagnosis not present

## 2014-10-27 DIAGNOSIS — M545 Low back pain: Secondary | ICD-10-CM | POA: Diagnosis not present

## 2014-10-27 DIAGNOSIS — M5441 Lumbago with sciatica, right side: Secondary | ICD-10-CM | POA: Diagnosis not present

## 2014-10-28 DIAGNOSIS — M545 Low back pain: Secondary | ICD-10-CM | POA: Diagnosis not present

## 2014-10-28 DIAGNOSIS — M5441 Lumbago with sciatica, right side: Secondary | ICD-10-CM | POA: Diagnosis not present

## 2014-11-01 DIAGNOSIS — G894 Chronic pain syndrome: Secondary | ICD-10-CM | POA: Diagnosis not present

## 2014-11-01 DIAGNOSIS — M47816 Spondylosis without myelopathy or radiculopathy, lumbar region: Secondary | ICD-10-CM | POA: Diagnosis not present

## 2014-11-01 DIAGNOSIS — Z79899 Other long term (current) drug therapy: Secondary | ICD-10-CM | POA: Diagnosis not present

## 2014-11-01 DIAGNOSIS — M5136 Other intervertebral disc degeneration, lumbar region: Secondary | ICD-10-CM | POA: Diagnosis not present

## 2014-11-01 DIAGNOSIS — M4696 Unspecified inflammatory spondylopathy, lumbar region: Secondary | ICD-10-CM | POA: Diagnosis not present

## 2014-11-29 DIAGNOSIS — M4697 Unspecified inflammatory spondylopathy, lumbosacral region: Secondary | ICD-10-CM | POA: Diagnosis not present

## 2014-11-29 DIAGNOSIS — M792 Neuralgia and neuritis, unspecified: Secondary | ICD-10-CM | POA: Diagnosis not present

## 2014-11-29 DIAGNOSIS — Z79899 Other long term (current) drug therapy: Secondary | ICD-10-CM | POA: Diagnosis not present

## 2014-11-29 DIAGNOSIS — M549 Dorsalgia, unspecified: Secondary | ICD-10-CM | POA: Diagnosis not present

## 2014-11-29 DIAGNOSIS — G894 Chronic pain syndrome: Secondary | ICD-10-CM | POA: Diagnosis not present

## 2014-11-29 DIAGNOSIS — M545 Low back pain: Secondary | ICD-10-CM | POA: Diagnosis not present

## 2014-12-01 DIAGNOSIS — M150 Primary generalized (osteo)arthritis: Secondary | ICD-10-CM | POA: Diagnosis not present

## 2014-12-01 DIAGNOSIS — M0589 Other rheumatoid arthritis with rheumatoid factor of multiple sites: Secondary | ICD-10-CM | POA: Diagnosis not present

## 2014-12-01 DIAGNOSIS — M5136 Other intervertebral disc degeneration, lumbar region: Secondary | ICD-10-CM | POA: Diagnosis not present

## 2014-12-01 DIAGNOSIS — R21 Rash and other nonspecific skin eruption: Secondary | ICD-10-CM | POA: Diagnosis not present

## 2014-12-09 DIAGNOSIS — M0589 Other rheumatoid arthritis with rheumatoid factor of multiple sites: Secondary | ICD-10-CM | POA: Diagnosis not present

## 2014-12-30 DIAGNOSIS — M47817 Spondylosis without myelopathy or radiculopathy, lumbosacral region: Secondary | ICD-10-CM | POA: Diagnosis not present

## 2014-12-30 DIAGNOSIS — M0589 Other rheumatoid arthritis with rheumatoid factor of multiple sites: Secondary | ICD-10-CM | POA: Diagnosis not present

## 2015-01-01 IMAGING — DX DG CHEST 1V PORT
1 series · 1 of 1 positions shown · non-contrast
Comparison: 12/12/2012

CLINICAL DATA: Chest pain and shortness of breath.

EXAM:
PORTABLE CHEST - 1 VIEW

[chest ap]
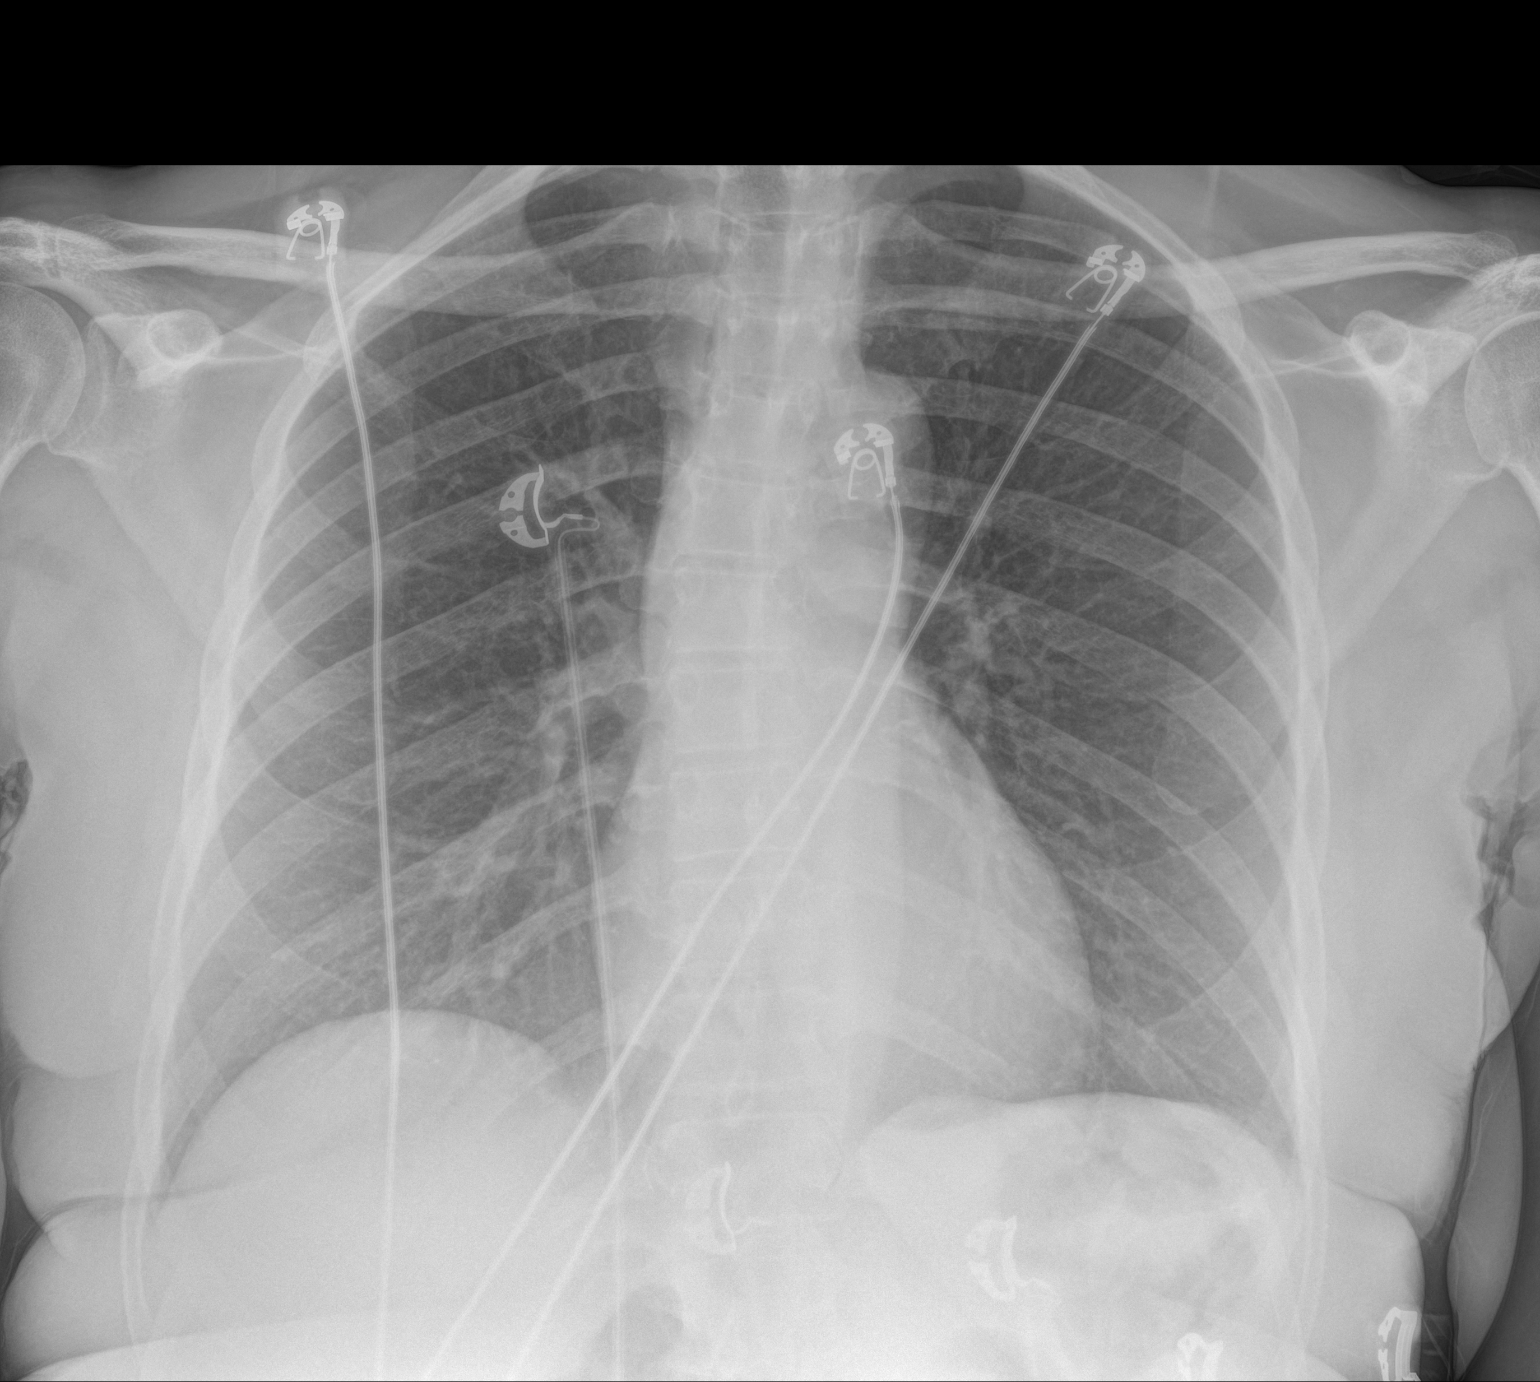

[1 of 1 positions shown; findings below may reference images not displayed]

FINDINGS: The heart size and mediastinal contours are within normal limits.
Cardiac leads project of the chest. Both lungs are clear. The
visualized skeletal structures are unremarkable.
IMPRESSION: No acute cardiopulmonary disease.

## 2015-01-05 ENCOUNTER — Ambulatory Visit
Admission: RE | Admit: 2015-01-05 | Discharge: 2015-01-05 | Disposition: A | Discharge status: Home / Self Care | Payer: Federal, State, Local not specified - PPO | Source: Ambulatory Visit | Attending: Anesthesiology | Admitting: Anesthesiology

## 2015-01-05 ENCOUNTER — Other Ambulatory Visit: Payer: Self-pay | Admitting: Anesthesiology

## 2015-01-05 DIAGNOSIS — M5136 Other intervertebral disc degeneration, lumbar region: Secondary | ICD-10-CM | POA: Diagnosis not present

## 2015-01-05 DIAGNOSIS — M545 Low back pain: Secondary | ICD-10-CM

## 2015-01-06 DIAGNOSIS — R040 Epistaxis: Secondary | ICD-10-CM | POA: Diagnosis not present

## 2015-01-06 DIAGNOSIS — Z6830 Body mass index (BMI) 30.0-30.9, adult: Secondary | ICD-10-CM | POA: Diagnosis not present

## 2015-01-06 DIAGNOSIS — S02402A Zygomatic fracture, unspecified, initial encounter for closed fracture: Secondary | ICD-10-CM | POA: Diagnosis not present

## 2015-01-07 ENCOUNTER — Other Ambulatory Visit: Payer: Self-pay | Admitting: Family Medicine

## 2015-01-07 ENCOUNTER — Ambulatory Visit
Admission: RE | Admit: 2015-01-07 | Discharge: 2015-01-07 | Disposition: A | Discharge status: Home / Self Care | Payer: Federal, State, Local not specified - PPO | Source: Ambulatory Visit | Attending: Family Medicine | Admitting: Family Medicine

## 2015-01-07 DIAGNOSIS — T1490XA Injury, unspecified, initial encounter: Secondary | ICD-10-CM

## 2015-01-07 DIAGNOSIS — S0993XA Unspecified injury of face, initial encounter: Secondary | ICD-10-CM | POA: Diagnosis not present

## 2015-01-12 DIAGNOSIS — Z79899 Other long term (current) drug therapy: Secondary | ICD-10-CM | POA: Diagnosis not present

## 2015-01-12 DIAGNOSIS — G894 Chronic pain syndrome: Secondary | ICD-10-CM | POA: Diagnosis not present

## 2015-01-19 DIAGNOSIS — M47817 Spondylosis without myelopathy or radiculopathy, lumbosacral region: Secondary | ICD-10-CM | POA: Diagnosis not present

## 2015-01-20 DIAGNOSIS — S02402D Zygomatic fracture, unspecified, subsequent encounter for fracture with routine healing: Secondary | ICD-10-CM | POA: Diagnosis not present

## 2015-01-26 DIAGNOSIS — M5136 Other intervertebral disc degeneration, lumbar region: Secondary | ICD-10-CM | POA: Diagnosis not present

## 2015-01-26 DIAGNOSIS — M0589 Other rheumatoid arthritis with rheumatoid factor of multiple sites: Secondary | ICD-10-CM | POA: Diagnosis not present

## 2015-01-26 DIAGNOSIS — R21 Rash and other nonspecific skin eruption: Secondary | ICD-10-CM | POA: Diagnosis not present

## 2015-01-26 DIAGNOSIS — M150 Primary generalized (osteo)arthritis: Secondary | ICD-10-CM | POA: Diagnosis not present

## 2015-01-27 DIAGNOSIS — M0589 Other rheumatoid arthritis with rheumatoid factor of multiple sites: Secondary | ICD-10-CM | POA: Diagnosis not present

## 2015-02-09 DIAGNOSIS — H43811 Vitreous degeneration, right eye: Secondary | ICD-10-CM | POA: Diagnosis not present

## 2015-02-09 DIAGNOSIS — H25813 Combined forms of age-related cataract, bilateral: Secondary | ICD-10-CM | POA: Diagnosis not present

## 2015-02-09 DIAGNOSIS — H47329 Drusen of optic disc, unspecified eye: Secondary | ICD-10-CM | POA: Diagnosis not present

## 2015-02-09 DIAGNOSIS — H0289 Other specified disorders of eyelid: Secondary | ICD-10-CM | POA: Diagnosis not present

## 2015-03-04 DIAGNOSIS — L400 Psoriasis vulgaris: Secondary | ICD-10-CM | POA: Diagnosis not present

## 2015-03-18 DIAGNOSIS — L400 Psoriasis vulgaris: Secondary | ICD-10-CM | POA: Diagnosis not present

## 2015-03-18 DIAGNOSIS — Z79899 Other long term (current) drug therapy: Secondary | ICD-10-CM | POA: Diagnosis not present

## 2015-03-30 DIAGNOSIS — R21 Rash and other nonspecific skin eruption: Secondary | ICD-10-CM | POA: Diagnosis not present

## 2015-03-30 DIAGNOSIS — M0589 Other rheumatoid arthritis with rheumatoid factor of multiple sites: Secondary | ICD-10-CM | POA: Diagnosis not present

## 2015-03-30 DIAGNOSIS — M5136 Other intervertebral disc degeneration, lumbar region: Secondary | ICD-10-CM | POA: Diagnosis not present

## 2015-03-30 DIAGNOSIS — M150 Primary generalized (osteo)arthritis: Secondary | ICD-10-CM | POA: Diagnosis not present

## 2015-03-31 DIAGNOSIS — M0589 Other rheumatoid arthritis with rheumatoid factor of multiple sites: Secondary | ICD-10-CM | POA: Diagnosis not present

## 2015-03-31 DIAGNOSIS — Z79899 Other long term (current) drug therapy: Secondary | ICD-10-CM | POA: Diagnosis not present

## 2015-04-04 DIAGNOSIS — L400 Psoriasis vulgaris: Secondary | ICD-10-CM | POA: Diagnosis not present

## 2015-04-04 DIAGNOSIS — Z79899 Other long term (current) drug therapy: Secondary | ICD-10-CM | POA: Diagnosis not present

## 2015-04-20 DIAGNOSIS — J441 Chronic obstructive pulmonary disease with (acute) exacerbation: Secondary | ICD-10-CM | POA: Diagnosis not present

## 2015-04-20 DIAGNOSIS — T7840XS Allergy, unspecified, sequela: Secondary | ICD-10-CM | POA: Diagnosis not present

## 2015-04-20 DIAGNOSIS — J399 Disease of upper respiratory tract, unspecified: Secondary | ICD-10-CM | POA: Diagnosis not present

## 2015-04-20 DIAGNOSIS — I10 Essential (primary) hypertension: Secondary | ICD-10-CM | POA: Diagnosis not present

## 2015-05-04 DIAGNOSIS — Z79899 Other long term (current) drug therapy: Secondary | ICD-10-CM | POA: Diagnosis not present

## 2015-05-04 DIAGNOSIS — L400 Psoriasis vulgaris: Secondary | ICD-10-CM | POA: Diagnosis not present

## 2015-05-19 DIAGNOSIS — M0589 Other rheumatoid arthritis with rheumatoid factor of multiple sites: Secondary | ICD-10-CM | POA: Diagnosis not present

## 2015-05-19 DIAGNOSIS — M5136 Other intervertebral disc degeneration, lumbar region: Secondary | ICD-10-CM | POA: Diagnosis not present

## 2015-05-19 DIAGNOSIS — R21 Rash and other nonspecific skin eruption: Secondary | ICD-10-CM | POA: Diagnosis not present

## 2015-05-19 DIAGNOSIS — M150 Primary generalized (osteo)arthritis: Secondary | ICD-10-CM | POA: Diagnosis not present

## 2015-05-26 DIAGNOSIS — M0589 Other rheumatoid arthritis with rheumatoid factor of multiple sites: Secondary | ICD-10-CM | POA: Diagnosis not present

## 2015-06-07 ENCOUNTER — Encounter (HOSPITAL_COMMUNITY): Payer: Self-pay

## 2015-06-07 ENCOUNTER — Inpatient Hospital Stay (HOSPITAL_COMMUNITY)
Admission: EM | Admit: 2015-06-07 | Discharge: 2015-06-14 | DRG: 871 | Disposition: A | Discharge status: Home Health | Payer: Medicare Other | Attending: Internal Medicine | Admitting: Internal Medicine

## 2015-06-07 ENCOUNTER — Emergency Department (HOSPITAL_COMMUNITY): Payer: Medicare Other

## 2015-06-07 DIAGNOSIS — R6520 Severe sepsis without septic shock: Secondary | ICD-10-CM | POA: Diagnosis present

## 2015-06-07 DIAGNOSIS — A419 Sepsis, unspecified organism: Secondary | ICD-10-CM | POA: Diagnosis not present

## 2015-06-07 DIAGNOSIS — R918 Other nonspecific abnormal finding of lung field: Secondary | ICD-10-CM | POA: Diagnosis not present

## 2015-06-07 DIAGNOSIS — M069 Rheumatoid arthritis, unspecified: Secondary | ICD-10-CM | POA: Diagnosis present

## 2015-06-07 DIAGNOSIS — R0602 Shortness of breath: Secondary | ICD-10-CM | POA: Diagnosis not present

## 2015-06-07 DIAGNOSIS — I10 Essential (primary) hypertension: Secondary | ICD-10-CM | POA: Diagnosis not present

## 2015-06-07 DIAGNOSIS — E8770 Fluid overload, unspecified: Secondary | ICD-10-CM | POA: Diagnosis not present

## 2015-06-07 DIAGNOSIS — J9811 Atelectasis: Secondary | ICD-10-CM | POA: Diagnosis not present

## 2015-06-07 DIAGNOSIS — R739 Hyperglycemia, unspecified: Secondary | ICD-10-CM | POA: Diagnosis present

## 2015-06-07 DIAGNOSIS — Z79899 Other long term (current) drug therapy: Secondary | ICD-10-CM

## 2015-06-07 DIAGNOSIS — R9431 Abnormal electrocardiogram [ECG] [EKG]: Secondary | ICD-10-CM | POA: Diagnosis not present

## 2015-06-07 DIAGNOSIS — D649 Anemia, unspecified: Secondary | ICD-10-CM | POA: Diagnosis present

## 2015-06-07 DIAGNOSIS — Z8249 Family history of ischemic heart disease and other diseases of the circulatory system: Secondary | ICD-10-CM | POA: Diagnosis not present

## 2015-06-07 DIAGNOSIS — I9589 Other hypotension: Secondary | ICD-10-CM | POA: Diagnosis not present

## 2015-06-07 DIAGNOSIS — E785 Hyperlipidemia, unspecified: Secondary | ICD-10-CM | POA: Diagnosis present

## 2015-06-07 DIAGNOSIS — N170 Acute kidney failure with tubular necrosis: Secondary | ICD-10-CM | POA: Diagnosis present

## 2015-06-07 DIAGNOSIS — J189 Pneumonia, unspecified organism: Secondary | ICD-10-CM | POA: Diagnosis not present

## 2015-06-07 DIAGNOSIS — N179 Acute kidney failure, unspecified: Secondary | ICD-10-CM | POA: Diagnosis present

## 2015-06-07 DIAGNOSIS — R0902 Hypoxemia: Secondary | ICD-10-CM | POA: Diagnosis not present

## 2015-06-07 DIAGNOSIS — D638 Anemia in other chronic diseases classified elsewhere: Secondary | ICD-10-CM | POA: Diagnosis not present

## 2015-06-07 DIAGNOSIS — I959 Hypotension, unspecified: Secondary | ICD-10-CM | POA: Diagnosis not present

## 2015-06-07 DIAGNOSIS — R100 Acute abdomen: Secondary | ICD-10-CM | POA: Diagnosis not present

## 2015-06-07 DIAGNOSIS — R0600 Dyspnea, unspecified: Secondary | ICD-10-CM | POA: Diagnosis not present

## 2015-06-07 DIAGNOSIS — R10811 Right upper quadrant abdominal tenderness: Secondary | ICD-10-CM | POA: Diagnosis not present

## 2015-06-07 DIAGNOSIS — K219 Gastro-esophageal reflux disease without esophagitis: Secondary | ICD-10-CM | POA: Diagnosis present

## 2015-06-07 DIAGNOSIS — R509 Fever, unspecified: Secondary | ICD-10-CM | POA: Diagnosis not present

## 2015-06-07 DIAGNOSIS — Z87891 Personal history of nicotine dependence: Secondary | ICD-10-CM | POA: Diagnosis not present

## 2015-06-07 DIAGNOSIS — Z90710 Acquired absence of both cervix and uterus: Secondary | ICD-10-CM

## 2015-06-07 DIAGNOSIS — K2970 Gastritis, unspecified, without bleeding: Secondary | ICD-10-CM | POA: Diagnosis not present

## 2015-06-07 HISTORY — DX: Pneumonia, unspecified organism: J18.9

## 2015-06-07 HISTORY — DX: Acute kidney failure, unspecified: N17.9

## 2015-06-07 LAB — COMPREHENSIVE METABOLIC PANEL
ALT: 28 U/L (ref 14–54)
AST: 71 U/L — ABNORMAL HIGH (ref 15–41)
Albumin: 2.7 g/dL — ABNORMAL LOW (ref 3.5–5.0)
Alkaline Phosphatase: 70 U/L (ref 38–126)
Anion gap: 11 (ref 5–15)
BUN: 47 mg/dL — ABNORMAL HIGH (ref 6–20)
CO2: 20 mmol/L — ABNORMAL LOW (ref 22–32)
Calcium: 7.7 mg/dL — ABNORMAL LOW (ref 8.9–10.3)
Chloride: 98 mmol/L — ABNORMAL LOW (ref 101–111)
Creatinine, Ser: 3.45 mg/dL — ABNORMAL HIGH (ref 0.44–1.00)
GFR calc Af Amer: 15 mL/min — ABNORMAL LOW (ref >60)
GFR calc non Af Amer: 13 mL/min — ABNORMAL LOW (ref >60)
Glucose, Bld: 131 mg/dL — ABNORMAL HIGH (ref 65–99)
Potassium: 4.7 mmol/L (ref 3.5–5.1)
Sodium: 129 mmol/L — ABNORMAL LOW (ref 135–145)
Total Bilirubin: 1.7 mg/dL — ABNORMAL HIGH (ref 0.3–1.2)
Total Protein: 6.6 g/dL (ref 6.5–8.1)

## 2015-06-07 LAB — CBC WITH DIFFERENTIAL/PLATELET
Basophils Absolute: 0 10*3/uL (ref 0.0–0.1)
Basophils Relative: 0 %
Eosinophils Absolute: 0 10*3/uL (ref 0.0–0.7)
Eosinophils Relative: 0 %
HCT: 26.6 % — ABNORMAL LOW (ref 36.0–46.0)
Hemoglobin: 9 g/dL — ABNORMAL LOW (ref 12.0–15.0)
Lymphocytes Relative: 5 %
Lymphs Abs: 1.1 10*3/uL (ref 0.7–4.0)
MCH: 26.9 pg (ref 26.0–34.0)
MCHC: 33.8 g/dL (ref 30.0–36.0)
MCV: 79.6 fL (ref 78.0–100.0)
Monocytes Absolute: 0.7 10*3/uL (ref 0.1–1.0)
Monocytes Relative: 3 %
Neutro Abs: 17.8 10*3/uL — ABNORMAL HIGH (ref 1.7–7.7)
Neutrophils Relative %: 92 %
Platelets: 315 10*3/uL (ref 150–400)
RBC: 3.34 MIL/uL — ABNORMAL LOW (ref 3.87–5.11)
RDW: 16 % — ABNORMAL HIGH (ref 11.5–15.5)
WBC: 19.5 10*3/uL — ABNORMAL HIGH (ref 4.0–10.5)

## 2015-06-07 LAB — I-STAT CG4 LACTIC ACID, ED: Lactic Acid, Venous: 3.07 mmol/L (ref 0.5–2.0)

## 2015-06-07 MED ORDER — ACETAMINOPHEN 325 MG PO TABS
650.0000 mg | ORAL_TABLET | Freq: Once | ORAL | Status: AC
  Administered 2015-06-07: 650 mg via ORAL
  Filled 2015-06-07: qty 2

## 2015-06-07 MED ORDER — SODIUM CHLORIDE 0.9 % IV BOLUS (SEPSIS)
1000.0000 mL | INTRAVENOUS | Status: DC
  Administered 2015-06-07: 1000 mL via INTRAVENOUS

## 2015-06-07 MED ORDER — SODIUM CHLORIDE 0.9 % IV BOLUS (SEPSIS): 500.0000 mL | INTRAVENOUS | Status: DC

## 2015-06-07 MED ORDER — SODIUM CHLORIDE 0.9 % IV BOLUS (SEPSIS)
1000.0000 mL | Freq: Once | INTRAVENOUS | Status: AC
  Administered 2015-06-07: 1000 mL via INTRAVENOUS

## 2015-06-07 MED ORDER — VANCOMYCIN HCL IN DEXTROSE 1-5 GM/200ML-% IV SOLN
1000.0000 mg | Freq: Once | INTRAVENOUS | Status: AC
  Administered 2015-06-07: 1000 mg via INTRAVENOUS
  Filled 2015-06-07: qty 200

## 2015-06-07 MED ORDER — LEVOFLOXACIN IN D5W 750 MG/150ML IV SOLN
750.0000 mg | Freq: Once | INTRAVENOUS | Status: AC
  Administered 2015-06-07: 750 mg via INTRAVENOUS
  Filled 2015-06-07: qty 150

## 2015-06-07 MED ORDER — SODIUM CHLORIDE 0.9 % IV BOLUS (SEPSIS)
1000.0000 mL | Freq: Once | INTRAVENOUS | Status: AC
  Administered 2015-06-08: 1000 mL via INTRAVENOUS

## 2015-06-07 MED ORDER — NOREPINEPHRINE BITARTRATE 1 MG/ML IV SOLN
2.0000 ug/min | INTRAVENOUS | Status: DC
  Filled 2015-06-07: qty 4

## 2015-06-07 NOTE — H&P (Signed)
[facility] History and Physical  Hattye Siegfried RWE:[id number] DOB: 1945-11-22 DOA: 06/07/2015  Referring physician: Chase Picket Ward, PA-C PCP: Geraldo Pitter, MD   Chief Complaint: Abdominal pain and low blood pressure.  HPI: Cheyenne Gould is a 70 y.o. female  with a past medical history of hypertension, rheumatoid arthritis, hyperlipidemia, seasonal allergies, chronic anemia, GERD who comes referred from urgent care due to hypotension and abdominal pain.  Per patient, she woke up on Thursday and since then she has been feeling very tired, fatigued,  without appetite and sleeping a lot more than usual. She is states that on Friday she had abdominal pain with nausea and 2 episodes of emesis, but no further vomiting since then. She denies fever, but has been feeling a lot of chills and her initial temperature in the emergency room was 102.40F. Other vital signs, blood pressure was 73/32 mmHg and heart rate 116 bpm. She complains of pleuritic chest pain and upper abdomen pain with coughing. She denies sick contacts or travel history.  When seen in the emergency department, patient stated she was feeling better with treatment. Workup in the emergency department shows leukocytosis, worsening anemia, elevated BUN and creatinine and bilateral lower lobe consolidation on chest radiograph, right greater than left.   Review of Systems:  Constitutional: Positive Fevers, chills, fatigue.  No weight loss, night sweats HEENT:  No headaches, difficulty swallowing,Tooth/dental problems,Sore throat.  No sneezing, itching, ear ache, nasal congestion, post nasal drip.  Cardio-vascular: Positive dizziness. No chest pain, Orthopnea, PND, swelling in lower extremities, anasarca, palpitations  GI: Positive loss of appetite, abdominal pain, nausea, vomiting 2 on Friday without further vomiting since then. No heartburn, indigestion diarrhea, change in bowel habits Resp:  Positive dyspnea and  nonproductive cough, no wheezing, no hemoptysis. Skin:  no rash or lesions.  GU:  no dysuria, change in color of urine, no urgency or frequency. No flank pain.  Musculoskeletal:  No joint pain or swelling. No decreased range of motion. No back pain.  Psych:  Positive hypersomnia and lethargy. No depression or anxiety. No memory loss.   Past Medical History  Diagnosis Date  . Rheumatoid arthritis(714.0)     on Leflunomide  . Hypertension   . Hyperlipidemia   . Allergic sinusitis   . Anemia   . Acid reflux    Past Surgical History  Procedure Laterality Date  . Bunionectomy  2002,12/18/2006  . Abdominal hysterectomy  1985    Partial   Social History:  reports that she quit smoking about 12 years ago. Her smoking use included Cigarettes. She has never used smokeless tobacco. She reports that she does not drink alcohol or use illicit drugs.  Allergies  Allergen Reactions  . Peanut-Containing Drug Products Anaphylaxis  . Shellfish Allergy Anaphylaxis  . Lyrica [Pregabalin] Other (See Comments)    Causes rheumatoid flares  . Penicillins Other (See Comments)    Corners of mouth started splitting  . Sulfa Antibiotics Other (See Comments)    unknown  . Leflunomide Rash    Family History  Problem Relation Age of Onset  . Heart disease Father   . Hypertension Sister   . Lupus Brother   . Cancer Brother     Colon    Prior to Admission medications   Medication Sig Start Date End Date Taking? Authorizing Provider  acitretin (SORIATANE) 25 MG capsule Take 25 mg by mouth daily. 05/29/15  Yes Historical Provider, MD  Amlodipine-Valsartan-HCTZ 5-160-12.5 MG TABS Take 1 tablet by mouth every  morning.  08/26/14  Yes Historical Provider, MD  calcipotriene (DOVONOX) 0.005 % ointment Apply 1 application topically 2 (two) times daily.  06/18/14  Yes Historical Provider, MD  folic acid (FOLVITE) 1 MG tablet 2 TABLET BY MOUTH ONCE DAILY 08/13/14  Yes Historical Provider, MD    HYDROcodone-acetaminophen (NORCO/VICODIN) 5-325 MG per tablet Take 1 tablet by mouth every 6 (six) hours as needed for moderate pain.  08/05/12  Yes Historical Provider, MD  levocetirizine (XYZAL) 5 MG tablet Take 5 mg by mouth at bedtime.  05/21/12  Yes Historical Provider, MD  valACYclovir (VALTREX) 500 MG tablet Take 500 mg by mouth daily. 05/26/15  Yes Historical Provider, MD  VITAMIN D, ERGOCALCIFEROL, PO Take 1 capsule by mouth once a week. OVER THE COUNTER DOSAGE UNKNOWN, ON FRIDAYS   Yes Historical Provider, MD  acetaminophen (TYLENOL) 500 MG tablet Take 500 mg by mouth every 6 (six) hours as needed for mild pain or headache.     Historical Provider, MD  clindamycin (CLEOCIN) 300 MG capsule Take 600 mg by mouth. Two hours prior to dental work.    Historical Provider, MD  clobetasol ointment (TEMOVATE) 0.05 % Apply 1 application topically 2 (two) times daily.  06/17/14   Historical Provider, MD  EPIPEN 2-PAK 0.3 MG/0.3ML SOAJ injection Inject 0.3 mg into the muscle as needed (for allergic reactions).  06/14/14   Historical Provider, MD  fluticasone (FLONASE) 50 MCG/ACT nasal spray Place 2 sprays into both nostrils daily as needed for allergies.  04/19/12   Historical Provider, MD  Menthol, Topical Analgesic, (BIOFREEZE EX) Apply 1 application topically daily as needed (knee pain.).    Historical Provider, MD  oxyCODONE-acetaminophen (PERCOCET) 5-325 MG per tablet Take 1 tablet by mouth every 4 (four) hours as needed for severe pain. Patient not taking: Reported on 06/07/2015 09/04/14   Danelle Berry, PA-C   Physical Exam: Filed Vitals:   06/07/15 2305 06/07/15 2307 06/08/15 0000 06/08/15 0017  BP: 73/32 91/50 108/60   Pulse:  106 101   Temp:    99 F (37.2 C)  TempSrc:    Oral  Resp:  21 25   Height:      Weight:      SpO2:  96% 99%     Wt Readings from Last 3 Encounters:  06/07/15 68.04 kg (150 lb)  01/01/14 79.8 kg (175 lb 14.8 oz)  12/23/12 80.468 kg (177 lb 6.4 oz)    General:   Appears ill.  Eyes: PERRL, normal lids, irises, No icterus. Pale conjunctiva ENT: grossly normal hearing, lips are dry and cracked. Oral mucosa is dry. Neck: no LAD, masses or thyromegaly Cardiovascular: Tachycardic, no m/r/g. No LE edema. Telemetry: Sinus tachycardia. Respiratory: Decreased breath sounds on bases (right> left), positive rales on LLL, RLL and RML, no wheezing, no rhonchi. No accessory muscle use.  Abdomen:  BS+, soft, positive epigastric and RUQ tenderness, no guarding, no rebound.  Skin: skin looks dry. Muskuloskeletal: grossly normal tone BUE/BLE Psychiatric: grossly normal mood and affect, speech fluent and appropriate Neurologic: Awake, alert, oriented 4, grossly non-focal.          Labs on Admission:  Basic Metabolic Panel:  Recent Labs Lab 06/07/15 2217  NA 129*  K 4.7  CL 98*  CO2 20*  GLUCOSE 131*  BUN 47*  CREATININE 3.45*  CALCIUM 7.7*   Liver Function Tests:  Recent Labs Lab 06/07/15 2217  AST 71*  ALT 28  ALKPHOS 70  BILITOT 1.7*  PROT 6.6  ALBUMIN 2.7*   CBC:  Recent Labs Lab 06/07/15 2133  WBC 19.5*  NEUTROABS 17.8*  HGB 9.0*  HCT 26.6*  MCV 79.6  PLT 315    Radiological Exams on Admission: Dg Chest Port 1 View  06/07/2015  CLINICAL DATA:  Abdominal pain, nausea and vomiting. Hypotension. Low grade fever. Tachycardia. EXAM: PORTABLE CHEST 1 VIEW COMPARISON:  01/01/2014 FINDINGS: Shallow inspiration. Infiltration or atelectasis in both lung bases, greater on the right. Changes may indicate pneumonia. Normal heart size and pulmonary vascularity. No blunting of costophrenic angles. No pneumothorax. Degenerative changes in the shoulders. IMPRESSION: Atelectasis or consolidation in both lung bases, greater on the right. Possible pneumonia. Followup PA and lateral chest X-ray is recommended in 3-4 weeks following trial of antibiotic therapy to ensure resolution and exclude underlying malignancy. Electronically Signed   By: Burman Nieves M.D.   On: 06/07/2015 21:53    EKG: Independently reviewed. Vent. rate 115 BPM PR interval 125 ms QRS duration 90 ms QT/QTc 264/365 ms P-R-T axes 55 -2 41 Sinus tachycardia RSR' in V1 or V2, right VCD or RVH  Assessment/Plan Principal Problem:   CAP (community acquired pneumonia)   Sepsis due to pneumonia (HCC) Admit to a stepdown. Continue supplemental oxygen. Continue IV hydration. Follow-up lactic acid level. Continue IV antibiotics per pharmacy. Follow-up blood cultures and sensitivity. Check a sputum culture and sensitivity. Check a strep pneumoniae and legionella urine antigens.  Active Problems:   Hypotension Hold antihypertensives for now. Continue fluid resuscitation.    AKI (acute kidney injury) (HCC) Urine analysis is still pending. Continue fluid challenge and monitor BUN/creatinine and electrolytes. Consider expanding workup and/or the nephrology consult if no improvement.    Rheumatoid arthritis (HCC) Stable. Analgesics as needed and follow up with rheumatology as scheduled.    Hyperlipidemia Continue lifestyle modifications.    Anemia Check anemia panel. Monitor H&H.        Hypertension Resume antihypertensive therapy once sepsis and AKI are resolved.     Code Status: Full code. DVT Prophylaxis: Lovenox SQ. Family Communication: Disposition Plan: Admit to the stepdown for further evaluation and treatment.  Time spent: About 90 minutes were used in the process of his admission.  Bobette Mo, M.D. [facility] Pager [phone].

## 2015-06-07 NOTE — Progress Notes (Signed)
Pharmacy Antibiotic Note  Cheyenne Gould is a 70 y.o. female admitted on 06/07/2015 with sepsis.  Pharmacy has been consulted for levaquin/vancomycin dosing.  Plan: Levaquin 750mg  IV x1 Vancomycin 1Gm x1 Order maintenance doses when Scr/CrCl available  Weight: 150 lb (68.04 kg)  Temp (24hrs), Avg:102.8 F (39.3 C), Min:102.8 F (39.3 C), Max:102.8 F (39.3 C)   Recent Labs Lab 06/07/15 2132 06/07/15 2133  WBC  --  19.5*  LATICACIDVEN 3.07*  --     CrCl cannot be calculated (Unknown ideal weight.).    Allergies  Allergen Reactions  . Peanut-Containing Drug Products Anaphylaxis  . Shellfish Allergy Anaphylaxis  . Lyrica [Pregabalin] Other (See Comments)    Causes rheumatoid flares  . Penicillins Other (See Comments)    Corners of mouth started splitting  . Sulfa Antibiotics Other (See Comments)    unknown  . Leflunomide Rash    Antimicrobials this admission: 4/18 levaquin >>  4/18 vancomycin >>   Dose adjustments this admission:   Microbiology results:  BCx:   UCx:    Sputum:    MRSA PCR:   Thank you for allowing pharmacy to be a part of this patient's care.  5/18 06/07/2015 9:55 PM

## 2015-06-07 NOTE — ED Notes (Signed)
Writer notified EDP of abnormal I-stat lactic result 

## 2015-06-07 NOTE — ED Notes (Signed)
Bed: [facility] Expected date:  Expected time:  Means of arrival:  Comments: 

## 2015-06-07 NOTE — ED Provider Notes (Signed)
CSN: [id number]     Arrival date & time 06/07/15  2030 History   First MD Initiated Contact with Patient 06/07/15 2050     Chief Complaint  Patient presents with  . Abdominal Pain  . Hypotension     (Consider location/radiation/quality/duration/timing/severity/associated sxs/prior Treatment) Patient is a 70 y.o. female presenting with abdominal pain. The history is provided by the patient and medical records. No language interpreter was used.  Abdominal Pain Associated symptoms: chills, cough, fever, nausea and shortness of breath   Associated symptoms: no dysuria and no vomiting    Cheyenne Gould is a 70 y.o. female  with a PMH of RA, HTN, HLD who was sent to the Emergency Department from Urgent Care for further evaluation. Patient went to urgent care prior to arrival complaining of right upper quadrant abdominal pain and nausea. She was noted to be hypotensive in the 90s systolic and febrile. She was started on IV fluids and sent to ED. Associated symptoms include shortness of breath, subjective fever, and chills. Symptoms began 5 days ago and have been gradually worsening.   Past Medical History  Diagnosis Date  . Rheumatoid arthritis(714.0)     on Leflunomide  . Hypertension   . Hyperlipidemia   . Allergic sinusitis   . Anemia   . Acid reflux    Past Surgical History  Procedure Laterality Date  . Bunionectomy  2002,12/18/2006  . Abdominal hysterectomy  1985    Partial   Family History  Problem Relation Age of Onset  . Heart disease Father   . Hypertension Sister   . Lupus Brother   . Cancer Brother     Colon   Social History  Substance Use Topics  . Smoking status: Former Smoker    Types: Cigarettes    Quit date: 02/20/2003  . Smokeless tobacco: Never Used  . Alcohol Use: No   OB History    No data available     Review of Systems  Constitutional: Positive for fever and chills.  HENT: Positive for congestion.   Eyes: Negative for visual disturbance.    Respiratory: Positive for cough and shortness of breath. Negative for wheezing.   Cardiovascular: Negative.   Gastrointestinal: Positive for nausea and abdominal pain. Negative for vomiting.  Genitourinary: Negative for dysuria.  Musculoskeletal: Negative for back pain and neck pain.  Skin: Negative for rash.  Neurological: Negative for dizziness and headaches.      Allergies  Peanut-containing drug products; Shellfish allergy; Lyrica; Penicillins; Sulfa antibiotics; and Leflunomide  Home Medications   Prior to Admission medications   Medication Sig Start Date End Date Taking? Authorizing Provider  acitretin (SORIATANE) 25 MG capsule Take 25 mg by mouth daily. 05/29/15  Yes Historical Provider, MD  Amlodipine-Valsartan-HCTZ 5-160-12.5 MG TABS Take 1 tablet by mouth every morning.  08/26/14  Yes Historical Provider, MD  calcipotriene (DOVONOX) 0.005 % ointment Apply 1 application topically 2 (two) times daily.  06/18/14  Yes Historical Provider, MD  folic acid (FOLVITE) 1 MG tablet 2 TABLET BY MOUTH ONCE DAILY 08/13/14  Yes Historical Provider, MD  HYDROcodone-acetaminophen (NORCO/VICODIN) 5-325 MG per tablet Take 1 tablet by mouth every 6 (six) hours as needed for moderate pain.  08/05/12  Yes Historical Provider, MD  levocetirizine (XYZAL) 5 MG tablet Take 5 mg by mouth at bedtime.  05/21/12  Yes Historical Provider, MD  valACYclovir (VALTREX) 500 MG tablet Take 500 mg by mouth daily. 05/26/15  Yes Historical Provider, MD  VITAMIN D, ERGOCALCIFEROL, PO Take  1 capsule by mouth once a week. OVER THE COUNTER DOSAGE UNKNOWN, ON FRIDAYS   Yes Historical Provider, MD  acetaminophen (TYLENOL) 500 MG tablet Take 500 mg by mouth every 6 (six) hours as needed for mild pain or headache.     Historical Provider, MD  clindamycin (CLEOCIN) 300 MG capsule Take 600 mg by mouth. Two hours prior to dental work.    Historical Provider, MD  clobetasol ointment (TEMOVATE) 0.05 % Apply 1 application topically 2 (two)  times daily.  06/17/14   Historical Provider, MD  EPIPEN 2-PAK 0.3 MG/0.3ML SOAJ injection Inject 0.3 mg into the muscle as needed (for allergic reactions).  06/14/14   Historical Provider, MD  fluticasone (FLONASE) 50 MCG/ACT nasal spray Place 2 sprays into both nostrils daily as needed for allergies.  04/19/12   Historical Provider, MD  Menthol, Topical Analgesic, (BIOFREEZE EX) Apply 1 application topically daily as needed (knee pain.).    Historical Provider, MD  oxyCODONE-acetaminophen (PERCOCET) 5-325 MG per tablet Take 1 tablet by mouth every 4 (four) hours as needed for severe pain. Patient not taking: Reported on 06/07/2015 09/04/14   Danelle Berry, PA-C   BP 122/65 mmHg  Pulse 116  Temp(Src) 102.8 F (39.3 C) (Oral)  Resp 22  Ht 5' 4.96" (1.65 m)  Wt 68.04 kg  BMI 24.99 kg/m2  SpO2 95% Physical Exam  Constitutional: She is oriented to person, place, and time. She appears well-developed and well-nourished.  Alert, NAD.   HENT:  Head: Normocephalic and atraumatic.  Cardiovascular: Normal rate, regular rhythm, normal heart sounds and intact distal pulses.  Exam reveals no gallop and no friction rub.   No murmur heard. Pulmonary/Chest: No respiratory distress.  Conversational dyspnea, increased effort in breathing. + crackles of middle/lower right lung field.   Abdominal: Soft. Bowel sounds are normal. She exhibits no distension and no mass. There is tenderness. There is no rebound and no guarding.    Musculoskeletal: She exhibits no edema.  Neurological: She is alert and oriented to person, place, and time.  Skin: Skin is warm and dry.  Nursing note and vitals reviewed.   ED Course  Procedures (including critical care time) Labs Review Labs Reviewed  CBC WITH DIFFERENTIAL/PLATELET - Abnormal; Notable for the following:    WBC 19.5 (*)    RBC 3.34 (*)    Hemoglobin 9.0 (*)    HCT 26.6 (*)    RDW 16.0 (*)    Neutro Abs 17.8 (*)    All other components within normal limits    I-STAT CG4 LACTIC ACID, ED - Abnormal; Notable for the following:    Lactic Acid, Venous 3.07 (*)    All other components within normal limits  CULTURE, BLOOD (ROUTINE X 2)  CULTURE, BLOOD (ROUTINE X 2)  URINE CULTURE  URINALYSIS, ROUTINE W REFLEX MICROSCOPIC (NOT AT [facility])  COMPREHENSIVE METABOLIC PANEL    Imaging Review Dg Chest Port 1 View  06/07/2015  CLINICAL DATA:  Abdominal pain, nausea and vomiting. Hypotension. Low grade fever. Tachycardia. EXAM: PORTABLE CHEST 1 VIEW COMPARISON:  01/01/2014 FINDINGS: Shallow inspiration. Infiltration or atelectasis in both lung bases, greater on the right. Changes may indicate pneumonia. Normal heart size and pulmonary vascularity. No blunting of costophrenic angles. No pneumothorax. Degenerative changes in the shoulders. IMPRESSION: Atelectasis or consolidation in both lung bases, greater on the right. Possible pneumonia. Followup PA and lateral chest X-ray is recommended in 3-4 weeks following trial of antibiotic therapy to ensure resolution and exclude underlying malignancy.  Electronically Signed   By: Burman Nieves M.D.   On: 06/07/2015 21:53   I have personally reviewed and evaluated these images and lab results as part of my medical decision-making.   EKG Interpretation None      MDM   Final diagnoses:  Shortness of breath   Cheyenne Gould presents to ED from Urgent Care for shortness of breath, RUQ abdominal pain. Hypotensive in 90's systolic at urgent care per nursing staff. Arrival vitals Tachycardic 116, febrile at 102.8, BP 122/65. Patient was in low 80's O2 on RA and was placed on 2L which improved sats to 95%. Labs, CXR order. Fluids started. Tylenol given for fever.   CBC: white count of 19.5 with shift., lactic 3.07, CMP: Sodium 129, acute renal insufficiency, bili 1.7, Albumin 2.7, AST 71; blood cultures x 2 obtained.  CXR with signs of PNA on right and possibly left.   Code sepsis initiated, patient PCN allergic -  started  on vanc/levaquin for sepsis of pulmonary source. Continuing IVF Consult to hospitalist, Dr. Robb Matar who will admit.   Patient seen by and discussed with Dr. Ranae Palms who agrees with treatment plan.   [facility] Ward, PA-C 06/08/15 0002  Loren Racer, MD 06/12/15 0005

## 2015-06-07 NOTE — ED Notes (Signed)
Per EMS- Pt from urgent care c/o abdominal pain n/v. Hypotensive, low grade fever, tachycardic.

## 2015-06-07 NOTE — ED Notes (Signed)
Bed: WA07 Expected date:  Expected time:  Means of arrival:  Comments: 70 yo F  RUQ pain

## 2015-06-07 NOTE — ED Notes (Signed)
Pt is unable to give a urine sample 1st attempt unsuccessful

## 2015-06-08 ENCOUNTER — Inpatient Hospital Stay (HOSPITAL_COMMUNITY): Payer: Medicare Other

## 2015-06-08 DIAGNOSIS — I959 Hypotension, unspecified: Secondary | ICD-10-CM | POA: Diagnosis present

## 2015-06-08 DIAGNOSIS — R0600 Dyspnea, unspecified: Secondary | ICD-10-CM

## 2015-06-08 DIAGNOSIS — D638 Anemia in other chronic diseases classified elsewhere: Secondary | ICD-10-CM

## 2015-06-08 DIAGNOSIS — N179 Acute kidney failure, unspecified: Secondary | ICD-10-CM

## 2015-06-08 DIAGNOSIS — A419 Sepsis, unspecified organism: Secondary | ICD-10-CM | POA: Diagnosis present

## 2015-06-08 DIAGNOSIS — R9431 Abnormal electrocardiogram [ECG] [EKG]: Secondary | ICD-10-CM

## 2015-06-08 DIAGNOSIS — J189 Pneumonia, unspecified organism: Secondary | ICD-10-CM | POA: Diagnosis present

## 2015-06-08 LAB — CBC WITH DIFFERENTIAL/PLATELET
Basophils Absolute: 0 10*3/uL (ref 0.0–0.1)
Basophils Relative: 0 %
Eosinophils Absolute: 0 10*3/uL (ref 0.0–0.7)
Eosinophils Relative: 0 %
HCT: 24.3 % — ABNORMAL LOW (ref 36.0–46.0)
Hemoglobin: 8.3 g/dL — ABNORMAL LOW (ref 12.0–15.0)
Lymphocytes Relative: 7 %
Lymphs Abs: 1.3 10*3/uL (ref 0.7–4.0)
MCH: 27.2 pg (ref 26.0–34.0)
MCHC: 34.2 g/dL (ref 30.0–36.0)
MCV: 79.7 fL (ref 78.0–100.0)
Monocytes Absolute: 0.8 10*3/uL (ref 0.1–1.0)
Monocytes Relative: 4 %
Neutro Abs: 17 10*3/uL — ABNORMAL HIGH (ref 1.7–7.7)
Neutrophils Relative %: 89 %
Platelets: 294 10*3/uL (ref 150–400)
RBC: 3.05 MIL/uL — ABNORMAL LOW (ref 3.87–5.11)
RDW: 15.8 % — ABNORMAL HIGH (ref 11.5–15.5)
WBC: 19.1 10*3/uL — ABNORMAL HIGH (ref 4.0–10.5)

## 2015-06-08 LAB — STREP PNEUMONIAE URINARY ANTIGEN: Strep Pneumo Urinary Antigen: NEGATIVE

## 2015-06-08 LAB — COMPREHENSIVE METABOLIC PANEL
ALT: 27 U/L (ref 14–54)
AST: 66 U/L — ABNORMAL HIGH (ref 15–41)
Albumin: 2.4 g/dL — ABNORMAL LOW (ref 3.5–5.0)
Alkaline Phosphatase: 64 U/L (ref 38–126)
Anion gap: 9 (ref 5–15)
BUN: 46 mg/dL — ABNORMAL HIGH (ref 6–20)
CO2: 19 mmol/L — ABNORMAL LOW (ref 22–32)
Calcium: 7.2 mg/dL — ABNORMAL LOW (ref 8.9–10.3)
Chloride: 104 mmol/L (ref 101–111)
Creatinine, Ser: 3.05 mg/dL — ABNORMAL HIGH (ref 0.44–1.00)
GFR calc Af Amer: 17 mL/min — ABNORMAL LOW (ref >60)
GFR calc non Af Amer: 15 mL/min — ABNORMAL LOW (ref >60)
Glucose, Bld: 127 mg/dL — ABNORMAL HIGH (ref 65–99)
Potassium: 4.9 mmol/L (ref 3.5–5.1)
Sodium: 132 mmol/L — ABNORMAL LOW (ref 135–145)
Total Bilirubin: 1.3 mg/dL — ABNORMAL HIGH (ref 0.3–1.2)
Total Protein: 6.1 g/dL — ABNORMAL LOW (ref 6.5–8.1)

## 2015-06-08 LAB — URINALYSIS, ROUTINE W REFLEX MICROSCOPIC
Glucose, UA: NEGATIVE mg/dL
Ketones, ur: NEGATIVE mg/dL
Nitrite: NEGATIVE
Protein, ur: 30 mg/dL — AB
Specific Gravity, Urine: 1.019 (ref 1.005–1.030)
pH: 5 (ref 5.0–8.0)

## 2015-06-08 LAB — ECHOCARDIOGRAM COMPLETE
Height: 64.961 in
Weight: 2400 oz

## 2015-06-08 LAB — IRON AND TIBC
Iron: 6 ug/dL — ABNORMAL LOW (ref 28–170)
Saturation Ratios: 4 % — ABNORMAL LOW (ref 10.4–31.8)
TIBC: 143 ug/dL — ABNORMAL LOW (ref 250–450)
UIBC: 137 ug/dL

## 2015-06-08 LAB — PHOSPHORUS: Phosphorus: 3.1 mg/dL (ref 2.5–4.6)

## 2015-06-08 LAB — MRSA PCR SCREENING: MRSA by PCR: POSITIVE — AB

## 2015-06-08 LAB — RETICULOCYTES
RBC.: 3.07 MIL/uL — ABNORMAL LOW (ref 3.87–5.11)
Retic Count, Absolute: 24.6 10*3/uL (ref 19.0–186.0)
Retic Ct Pct: 0.8 % (ref 0.4–3.1)

## 2015-06-08 LAB — URINE MICROSCOPIC-ADD ON

## 2015-06-08 LAB — FERRITIN: Ferritin: 495 ng/mL — ABNORMAL HIGH (ref 11–307)

## 2015-06-08 LAB — ABO/RH: ABO/RH(D): O POS

## 2015-06-08 LAB — VITAMIN B12: Vitamin B-12: 155 pg/mL — ABNORMAL LOW (ref 180–914)

## 2015-06-08 LAB — FOLATE: Folate: 34.3 ng/mL (ref >5.9)

## 2015-06-08 LAB — LACTIC ACID, PLASMA: Lactic Acid, Venous: 1.5 mmol/L (ref 0.5–2.0)

## 2015-06-08 LAB — MAGNESIUM: Magnesium: 1.2 mg/dL — ABNORMAL LOW (ref 1.7–2.4)

## 2015-06-08 MED ORDER — LEVOCETIRIZINE DIHYDROCHLORIDE 5 MG PO TABS: 5.0000 mg | ORAL_TABLET | Freq: Every day | ORAL | Status: DC

## 2015-06-08 MED ORDER — ALBUTEROL SULFATE (2.5 MG/3ML) 0.083% IN NEBU: 2.5000 mg | INHALATION_SOLUTION | Freq: Four times a day (QID) | RESPIRATORY_TRACT | Status: DC | PRN

## 2015-06-08 MED ORDER — ENOXAPARIN SODIUM 30 MG/0.3ML ~~LOC~~ SOLN
30.0000 mg | SUBCUTANEOUS | Status: DC
  Administered 2015-06-08 – 2015-06-12 (×5): 30 mg via SUBCUTANEOUS
  Filled 2015-06-08 (×5): qty 0.3

## 2015-06-08 MED ORDER — VALACYCLOVIR HCL 500 MG PO TABS
500.0000 mg | ORAL_TABLET | Freq: Every day | ORAL | Status: DC
Start: 2015-06-08 — End: 2015-06-14
  Administered 2015-06-08 – 2015-06-12 (×5): 500 mg via ORAL
  Filled 2015-06-08 (×7): qty 1

## 2015-06-08 MED ORDER — VITAMINS A & D EX OINT
TOPICAL_OINTMENT | CUTANEOUS | Status: AC
  Administered 2015-06-08: 01:00:00
  Filled 2015-06-08: qty 5

## 2015-06-08 MED ORDER — CETYLPYRIDINIUM CHLORIDE 0.05 % MT LIQD
7.0000 mL | Freq: Two times a day (BID) | OROMUCOSAL | Status: DC
  Administered 2015-06-08 – 2015-06-14 (×11): 7 mL via OROMUCOSAL

## 2015-06-08 MED ORDER — HYDROCODONE-ACETAMINOPHEN 5-325 MG PO TABS
1.0000 | ORAL_TABLET | Freq: Four times a day (QID) | ORAL | Status: DC | PRN
  Administered 2015-06-08 – 2015-06-13 (×11): 1 via ORAL
  Filled 2015-06-08 (×11): qty 1

## 2015-06-08 MED ORDER — VANCOMYCIN HCL IN DEXTROSE 1-5 GM/200ML-% IV SOLN
1000.0000 mg | INTRAVENOUS | Status: DC
  Administered 2015-06-09: 1000 mg via INTRAVENOUS
  Filled 2015-06-08 (×2): qty 200

## 2015-06-08 MED ORDER — FLUTICASONE PROPIONATE 50 MCG/ACT NA SUSP
2.0000 | Freq: Every day | NASAL | Status: DC | PRN
  Administered 2015-06-10: 2 via NASAL
  Filled 2015-06-08: qty 16

## 2015-06-08 MED ORDER — IPRATROPIUM BROMIDE 0.02 % IN SOLN: 0.5000 mg | Freq: Four times a day (QID) | RESPIRATORY_TRACT | Status: DC | PRN

## 2015-06-08 MED ORDER — ACITRETIN 25 MG PO CAPS: 25.0000 mg | ORAL_CAPSULE | Freq: Every day | ORAL | Status: DC

## 2015-06-08 MED ORDER — MUPIROCIN 2 % EX OINT
1.0000 "application " | TOPICAL_OINTMENT | Freq: Two times a day (BID) | CUTANEOUS | Status: AC
  Administered 2015-06-08 – 2015-06-12 (×10): 1 via NASAL
  Filled 2015-06-08: qty 22

## 2015-06-08 MED ORDER — LEVOFLOXACIN IN D5W 500 MG/100ML IV SOLN
500.0000 mg | INTRAVENOUS | Status: DC
  Administered 2015-06-09: 500 mg via INTRAVENOUS
  Filled 2015-06-08: qty 100

## 2015-06-08 MED ORDER — ACETAMINOPHEN 500 MG PO TABS
500.0000 mg | ORAL_TABLET | Freq: Four times a day (QID) | ORAL | Status: DC | PRN
  Administered 2015-06-08 – 2015-06-10 (×5): 500 mg via ORAL
  Filled 2015-06-08 (×5): qty 1

## 2015-06-08 MED ORDER — CHLORHEXIDINE GLUCONATE CLOTH 2 % EX PADS: 6.0000 | MEDICATED_PAD | Freq: Every day | CUTANEOUS | Status: DC

## 2015-06-08 MED ORDER — LORATADINE 10 MG PO TABS
5.0000 mg | ORAL_TABLET | Freq: Every day | ORAL | Status: DC
  Administered 2015-06-08 – 2015-06-13 (×7): 5 mg via ORAL
  Filled 2015-06-08: qty 1
  Filled 2015-06-08 (×2): qty 0.5
  Filled 2015-06-08 (×2): qty 1
  Filled 2015-06-08: qty 0.5
  Filled 2015-06-08: qty 1

## 2015-06-08 MED ORDER — CHLORHEXIDINE GLUCONATE CLOTH 2 % EX PADS
6.0000 | MEDICATED_PAD | Freq: Every day | CUTANEOUS | Status: AC
  Administered 2015-06-09 – 2015-06-13 (×5): 6 via TOPICAL

## 2015-06-08 MED ORDER — ONDANSETRON HCL 4 MG PO TABS: 4.0000 mg | ORAL_TABLET | Freq: Four times a day (QID) | ORAL | Status: DC | PRN

## 2015-06-08 MED ORDER — FOLIC ACID 1 MG PO TABS
1.0000 mg | ORAL_TABLET | Freq: Every day | ORAL | Status: DC
  Administered 2015-06-08 – 2015-06-14 (×7): 1 mg via ORAL
  Filled 2015-06-08 (×7): qty 1

## 2015-06-08 MED ORDER — ONDANSETRON HCL 4 MG/2ML IJ SOLN: 4.0000 mg | Freq: Four times a day (QID) | INTRAMUSCULAR | Status: DC | PRN

## 2015-06-08 MED ORDER — SODIUM CHLORIDE 0.9 % IV SOLN
INTRAVENOUS | Status: DC
  Administered 2015-06-08 – 2015-06-09 (×3): via INTRAVENOUS

## 2015-06-08 NOTE — Progress Notes (Signed)
Pharmacy Antibiotic Note  Cheyenne Gould is a 70 y.o. female admitted on 06/07/2015 with sepsis.  Pharmacy has been consulted for levaquin/vancomycin dosing.  Plan: Levaquin 750mg  IV x1, then levofloxacin 500 mg IV q48h Vancomycin 1000 mg x1, vancomycin 1000 mg IV q48h   Height: 5' 4.96" (165 cm) Weight: 150 lb (68.04 kg) IBW/kg (Calculated) : 56.91  Temp (24hrs), Avg:100.9 F (38.3 C), Min:99 F (37.2 C), Max:102.8 F (39.3 C)   Recent Labs Lab 06/07/15 2132 06/07/15 2133 06/07/15 2217  WBC  --  19.5*  --   CREATININE  --   --  3.45*  LATICACIDVEN 3.07*  --   --     Estimated Creatinine Clearance: 14 mL/min (by C-G formula based on Cr of 3.45).    Allergies  Allergen Reactions  . Peanut-Containing Drug Products Anaphylaxis  . Shellfish Allergy Anaphylaxis  . Lyrica [Pregabalin] Other (See Comments)    Causes rheumatoid flares  . Penicillins Other (See Comments)    Corners of mouth started splitting  . Sulfa Antibiotics Other (See Comments)    unknown  . Leflunomide Rash    Antimicrobials this admission: 4/18 levaquin >>  4/18 vancomycin >>   Dose adjustments this admission:   Microbiology results: 4/18 : BCx:    Thank you for allowing pharmacy to be a part of this patient's care.   5/18, PharmD, BCPS Pager [phone] 06/08/2015 12:39 AM

## 2015-06-08 NOTE — Progress Notes (Signed)
Initial Nutrition Assessment  DOCUMENTATION CODES:   Not applicable  INTERVENTION:  -RD continue to monitor -Ensure Enlive po BID, each supplement provides 350 kcal and 20 grams of protein  NUTRITION DIAGNOSIS:   Inadequate oral intake related to poor appetite, chronic illness, acute illness as evidenced by per patient/family report.  GOAL:   Patient will meet greater than or equal to 90% of their needs  MONITOR:   PO intake, Labs, I & O's, Skin  REASON FOR ASSESSMENT:   Malnutrition Screening Tool    ASSESSMENT:   Cheyenne Gould is a 70 y.o. female with a past medical history of hypertension, rheumatoid arthritis, hyperlipidemia, seasonal allergies, chronic anemia, GERD who comes referred from urgent care due to hypotension and abdominal pain.  Spoke with Cheyenne Gould at bedside. She indicates poor appetite x2-3 weeks PTA r/t overall feeling of malaise. During this time span, she froze fruit juices and drank them as slushies to help maintain her calorie intake. States she would eat maybe 1 meal a day and take a few bites.  Prior to that, she endorses losing ~ 40# in 1 year. Patient's most recent weight in chart PTA was 175# as of 12/2013. Per RD exam, she exhibits no muscle wasting, fat depletion, or edema. States that during this time span, she was "just losing weight." She could not give a reason behind it, but she wasn't eating as much as she had been previously. I suspect patient is experiencing decreased appetite r/t aging, doesn't appear to be illness related at this time.  She is currently on venturi mask with CAP and Sepsis.   Labs: Na 132, Mg 1.2, Ca 7.2 Medications reviewed.   Diet Order:  Diet Heart Room service appropriate?: Yes; Fluid consistency:: Thin  Skin:  Reviewed, no issues  Last BM:  PTA  Height:   Ht Readings from Last 1 Encounters:  06/07/15 5' 4.96" (1.65 m)    Weight:   Wt Readings from Last 1 Encounters:  06/07/15 150 lb (68.04 kg)     Ideal Body Weight:  56.81 kg  BMI:  Body mass index is 24.99 kg/(m^2).  Estimated Nutritional Needs:   Kcal:  1700-2000  Protein:  70-80 grams  Fluid:  Per MD  EDUCATION NEEDS:   No education needs identified at this time  Dionne Ano. Ghazi Rumpf, MS, RD LDN After Hours/Weekend Pager [phone]

## 2015-06-08 NOTE — Care Management Note (Signed)
Case Management Note  Patient Details  Name: Azhar Yogi MRN: [id number] Date of Birth: [birth year]  Subjective/Objective:         sepsis           Action/Plan:Date:  June 08, 2015 Chart reviewed for concurrent status and case management needs. Will continue to follow patient for changes and needs: Marcelle Smiling, BSN, RN, [place]   [phone]   Expected Discharge Date:                  Expected Discharge Plan:  Home/Self Care  In-House Referral:  NA  Discharge planning Services  CM Consult  Post Acute Care Choice:  NA Choice offered to:  NA  DME Arranged:    DME Agency:     HH Arranged:    HH Agency:     Status of Service:  Completed, signed off  Medicare Important Message Given:    Date Medicare IM Given:    Medicare IM give by:    Date Additional Medicare IM Given:    Additional Medicare Important Message give by:     If discussed at Long Length of Stay Meetings, dates discussed:    Additional Comments:  Golda Acre, RN 06/08/2015, 10:18 AM

## 2015-06-08 NOTE — Progress Notes (Signed)
Echocardiogram 2D Echocardiogram has been performed.  Cheyenne Gould 06/08/2015, 12:33 PM

## 2015-06-08 NOTE — Progress Notes (Addendum)
PROGRESS NOTE    Cheyenne Gould  EXB:[id number] DOB: 1946/01/21 DOA: 06/07/2015 PCP: Geraldo Pitter, MD Outpatient Specialists:   Brief Narrative: 70/F with history of hypertension, rheumatoid arthritis on remicaide, chronic anemia, GERD admitted with Sepsis, Hypotension, Pneumonia and AKI. 4/19 increasing O2 demand  Assessment & Plan:     Sepsis with hypotension due to pneumonia -continue current Abx-Vanc/levaquin -repeat CXR due to increasing O2 demand -BP improving with fluid resuscitation -continue IVF, place foley -lactate down to 1.5 from 3.0 -UA abnormal too, FU urine Cx   AKi  -due to sepsis/hypotension and concomitant ARB/HCTZ use -baseline creatinine was 1.0 in 11/15 -no urine documented since admission -place foley, IVF, strict I/Os  Anemia  -due to chronic disease, remicaide, iron defi and dilution -monitor  RA -on remicaide infusions per Dr/Beekman, last in April  HTN -BP soft but better -continue IVF, holding all BP meds  Hyperglycemia -check Hbaic  DVT prophylaxis: lovenox    Code Status: Full Code Family Communication: No family at bedside Disposition Plan: Keep in SDU, Home when improved atleast 3-4days   Consultants:      Procedures:   Antimicrobials: Vanc/levaquin 4/18  Subjective: Feels weak, denies any Abd pain  Objective: Filed Vitals:   06/08/15 0900 06/08/15 1000 06/08/15 1100 06/08/15 1200  BP: 123/65 108/56 100/51 100/48  Pulse: 106 107 105 103  Temp:      TempSrc:      Resp: 29 27 27 20   Height:      Weight:      SpO2: 95% 95% 95% 94%    Intake/Output Summary (Last 24 hours) at 06/08/15 1256 Last data filed at 06/08/15 1230  Gross per 24 hour  Intake 1091.67 ml  Output    375 ml  Net 716.67 ml   Filed Weights   06/07/15 2144  Weight: 68.04 kg (150 lb)    Examination:  General exam: Appears calm and comfortable, no distress Respiratory system: ronchi at R base Cardiovascular system: S1 & S2 heard, RRR. No  JVD, murmurs, rubs, gallops or clicks Gastrointestinal system: Abdomen is nondistended, soft and nontender. No organomegaly or masses felt. Normal bowel sounds  Central nervous system: Alert and oriented. No focal neurological deficits. Extremities: Symmetric 5 x 5 power. Skin: No rashes, lesions or ulcers Psychiatry: Judgement and insight appear normal. Mood & affect appropriate.     Data Reviewed: I have personally reviewed following labs and imaging studies  CBC:  Recent Labs Lab 06/07/15 2133 06/08/15 0143  WBC 19.5* 19.1*  NEUTROABS 17.8* 17.0*  HGB 9.0* 8.3*  HCT 26.6* 24.3*  MCV 79.6 79.7  PLT 315 294   Basic Metabolic Panel:  Recent Labs Lab 06/07/15 2217 06/08/15 0143  NA 129* 132*  K 4.7 4.9  CL 98* 104  CO2 20* 19*  GLUCOSE 131* 127*  BUN 47* 46*  CREATININE 3.45* 3.05*  CALCIUM 7.7* 7.2*  MG  --  1.2*  PHOS  --  3.1   GFR: Estimated Creatinine Clearance: 15.9 mL/min (by C-G formula based on Cr of 3.05). Liver Function Tests:  Recent Labs Lab 06/07/15 2217 06/08/15 0143  AST 71* 66*  ALT 28 27  ALKPHOS 70 64  BILITOT 1.7* 1.3*  PROT 6.6 6.1*  ALBUMIN 2.7* 2.4*   No results for input(s): LIPASE, AMYLASE in the last 168 hours. No results for input(s): AMMONIA in the last 168 hours. Coagulation Profile: No results for input(s): INR, PROTIME in the last 168 hours. Cardiac Enzymes: No results for  input(s): CKTOTAL, CKMB, CKMBINDEX, TROPONINI in the last 168 hours. BNP (last 3 results) No results for input(s): PROBNP in the last 8760 hours. HbA1C: No results for input(s): HGBA1C in the last 72 hours. CBG: No results for input(s): GLUCAP in the last 168 hours. Lipid Profile: No results for input(s): CHOL, HDL, LDLCALC, TRIG, CHOLHDL, LDLDIRECT in the last 72 hours. Thyroid Function Tests: No results for input(s): TSH, T4TOTAL, FREET4, T3FREE, THYROIDAB in the last 72 hours. Anemia Panel:  Recent Labs  06/08/15 0305 06/08/15 0316    VITAMINB12  --  155*  FOLATE 34.3  --   FERRITIN  --  495*  TIBC  --  143*  IRON  --  6*  RETICCTPCT 0.8  --    Urine analysis:    Component Value Date/Time   COLORURINE AMBER* 06/08/2015 0935   APPEARANCEUR TURBID* 06/08/2015 0935   LABSPEC 1.019 06/08/2015 0935   PHURINE 5.0 06/08/2015 0935   GLUCOSEU NEGATIVE 06/08/2015 0935   HGBUR SMALL* 06/08/2015 0935   BILIRUBINUR SMALL* 06/08/2015 0935   KETONESUR NEGATIVE 06/08/2015 0935   PROTEINUR 30* 06/08/2015 0935   NITRITE NEGATIVE 06/08/2015 0935   LEUKOCYTESUR SMALL* 06/08/2015 0935   Sepsis Labs: @LABRCNTIP (procalcitonin:4,lacticidven:4)  ) Recent Results (from the past 240 hour(s))  MRSA PCR Screening     Status: Abnormal   Collection Time: 06/08/15 12:41 AM  Result Value Ref Range Status   MRSA by PCR POSITIVE (A) NEGATIVE Final    Comment:        The GeneXpert MRSA Assay (FDA approved for NASAL specimens only), is one component of a comprehensive MRSA colonization surveillance program. It is not intended to diagnose MRSA infection nor to guide or monitor treatment for MRSA infections. RESULT CALLED TO, READ BACK BY AND VERIFIED WITH: A 06/10/15 RN @ 0410 ON 06/08/15 BY C DAVIS          Radiology Studies: Dg Chest Port 1 View  06/08/2015  CLINICAL DATA:  Hypoxia. EXAM: PORTABLE CHEST 1 VIEW COMPARISON:  June 07, 2015. FINDINGS: Stable cardiomediastinal silhouette. No pneumothorax is noted. Stable mild left basilar subsegmental atelectasis is noted. Increased right upper lobe airspace opacity is noted concerning for worsening pneumonia. Mild right lower lobe atelectasis or pneumonia is noted as well. IMPRESSION: Stable mild left basilar subsegmental atelectasis. Increased right upper lobe pneumonia is noted. Electronically Signed   By: June 09, 2015, M.D.   On: 06/08/2015 09:07   Dg Chest Port 1 View  06/07/2015  CLINICAL DATA:  Abdominal pain, nausea and vomiting. Hypotension. Low grade fever.  Tachycardia. EXAM: PORTABLE CHEST 1 VIEW COMPARISON:  01/01/2014 FINDINGS: Shallow inspiration. Infiltration or atelectasis in both lung bases, greater on the right. Changes may indicate pneumonia. Normal heart size and pulmonary vascularity. No blunting of costophrenic angles. No pneumothorax. Degenerative changes in the shoulders. IMPRESSION: Atelectasis or consolidation in both lung bases, greater on the right. Possible pneumonia. Followup PA and lateral chest X-ray is recommended in 3-4 weeks following trial of antibiotic therapy to ensure resolution and exclude underlying malignancy. Electronically Signed   By: 01/03/2014 M.D.   On: 06/07/2015 21:53        Scheduled Meds: . antiseptic oral rinse  7 mL Mouth Rinse BID  . [START ON 06/09/2015] Chlorhexidine Gluconate Cloth  6 each Topical Q0600  . enoxaparin (LOVENOX) injection  30 mg Subcutaneous Q24H  . folic acid  1 mg Oral Daily  . [START ON 06/09/2015] levofloxacin (LEVAQUIN) IV  500 mg Intravenous  Q48H  . loratadine  5 mg Oral QHS  . mupirocin ointment  1 application Nasal BID  . valACYclovir  500 mg Oral Daily  . [START ON 06/09/2015] vancomycin  1,000 mg Intravenous Q48H   Continuous Infusions: . sodium chloride 100 mL/hr at 06/08/15 0105     LOS: 1 day    Time spent:    Zannie Cove, MD [facility] Pager [phone]  If 7PM-7AM, please contact night-coverage [email or web address] Password [facility] 06/08/2015, 12:56 PM

## 2015-06-09 ENCOUNTER — Inpatient Hospital Stay (HOSPITAL_COMMUNITY): Payer: Medicare Other

## 2015-06-09 LAB — COMPREHENSIVE METABOLIC PANEL
ALT: 24 U/L (ref 14–54)
AST: 51 U/L — ABNORMAL HIGH (ref 15–41)
Albumin: 2.3 g/dL — ABNORMAL LOW (ref 3.5–5.0)
Alkaline Phosphatase: 78 U/L (ref 38–126)
Anion gap: 11 (ref 5–15)
BUN: 49 mg/dL — ABNORMAL HIGH (ref 6–20)
CO2: 16 mmol/L — ABNORMAL LOW (ref 22–32)
Calcium: 7.8 mg/dL — ABNORMAL LOW (ref 8.9–10.3)
Chloride: 108 mmol/L (ref 101–111)
Creatinine, Ser: 2.93 mg/dL — ABNORMAL HIGH (ref 0.44–1.00)
GFR calc Af Amer: 18 mL/min — ABNORMAL LOW (ref >60)
GFR calc non Af Amer: 15 mL/min — ABNORMAL LOW (ref >60)
Glucose, Bld: 90 mg/dL (ref 65–99)
Potassium: 4.9 mmol/L (ref 3.5–5.1)
Sodium: 135 mmol/L (ref 135–145)
Total Bilirubin: 1.6 mg/dL — ABNORMAL HIGH (ref 0.3–1.2)
Total Protein: 6.1 g/dL — ABNORMAL LOW (ref 6.5–8.1)

## 2015-06-09 LAB — CBC WITH DIFFERENTIAL/PLATELET
Basophils Absolute: 0 10*3/uL (ref 0.0–0.1)
Basophils Relative: 0 %
Eosinophils Absolute: 0 10*3/uL (ref 0.0–0.7)
Eosinophils Relative: 0 %
HCT: 22.6 % — ABNORMAL LOW (ref 36.0–46.0)
Hemoglobin: 7.6 g/dL — ABNORMAL LOW (ref 12.0–15.0)
Lymphocytes Relative: 8 %
Lymphs Abs: 1 10*3/uL (ref 0.7–4.0)
MCH: 26 pg (ref 26.0–34.0)
MCHC: 33.6 g/dL (ref 30.0–36.0)
MCV: 77.4 fL — ABNORMAL LOW (ref 78.0–100.0)
Monocytes Absolute: 0.8 10*3/uL (ref 0.1–1.0)
Monocytes Relative: 6 %
Neutro Abs: 11 10*3/uL — ABNORMAL HIGH (ref 1.7–7.7)
Neutrophils Relative %: 86 %
Platelets: 311 10*3/uL (ref 150–400)
RBC: 2.92 MIL/uL — ABNORMAL LOW (ref 3.87–5.11)
RDW: 16 % — ABNORMAL HIGH (ref 11.5–15.5)
WBC: 12.8 10*3/uL — ABNORMAL HIGH (ref 4.0–10.5)

## 2015-06-09 LAB — URINE CULTURE: Culture: NO GROWTH

## 2015-06-09 LAB — HEMOGLOBIN A1C
Hgb A1c MFr Bld: 5.8 % — ABNORMAL HIGH (ref 4.8–5.6)
Mean Plasma Glucose: 120 mg/dL

## 2015-06-09 MED ORDER — SODIUM CHLORIDE 0.9 % IV BOLUS (SEPSIS)
500.0000 mL | Freq: Once | INTRAVENOUS | Status: AC
  Administered 2015-06-09: 500 mL via INTRAVENOUS

## 2015-06-09 NOTE — Progress Notes (Signed)
PROGRESS NOTE    Cheyenne Gould  EZM:[id number] DOB: 1945-05-21 DOA: 06/07/2015 PCP: Geraldo Pitter, MD Outpatient Specialists:   Brief Narrative: 70/F with history of hypertension, rheumatoid arthritis on remicaide, chronic anemia, GERD admitted with Sepsis, Hypotension, Pneumonia and AKI. 4/19 increasing O2 demand  Assessment & Plan:     Sepsis with hypotension due to pneumonia -continue current Abx-Vanc/levaquin -repeat CXR  4/19 with increased RUL pneumonia, O2 needs improving from last pm -BP improving, continue IVF-cut down IVF rate -lactate down to 1.5 from 3.0 -UA abnormal too, FU urine Cx   AKi  -due to sepsis/hypotension and concomitant ARB/HCTZ use -baseline creatinine was 1.0 in 11/15 -no urine documented since admission -place foley, IVF, strict I/Os  Anemia  -due to chronic disease, remicaide, iron defi and dilution -monitor  RA -on remicaide infusions per Dr/Beekman, last in April  HTN -BP soft but better -continue IVF, holding all BP meds  Hyperglycemia -Hbaic 5.8  DVT prophylaxis: lovenox    Code Status: Full Code Family Communication: No family at bedside Disposition Plan: Keep in SDU, Tx to floor tomorrow   Consultants:      Procedures:   Antimicrobials: Vanc/levaquin 4/18  Subjective: Feels weak, denies any Abd pain  Objective: Filed Vitals:   06/09/15 1000 06/09/15 1100 06/09/15 1200 06/09/15 1300  BP: 118/86 123/55 116/57 128/64  Pulse: 114 95 100 102  Temp:   98.4 F (36.9 C)   TempSrc:   Oral   Resp: 21 23 27 25   Height:      Weight:      SpO2: 95% 95% 94% 95%    Intake/Output Summary (Last 24 hours) at 06/09/15 1358 Last data filed at 06/09/15 1340  Gross per 24 hour  Intake   2805 ml  Output   1050 ml  Net   1755 ml   Filed Weights   06/07/15 2144  Weight: 68.04 kg (150 lb)    Examination:  General exam: Appears calm and comfortable, no distress Respiratory system: ronchi at R base Cardiovascular system:  S1 & S2 heard, RRR. No JVD, murmurs, rubs, gallops or clicks Gastrointestinal system: Abdomen is nondistended, soft and nontender. No organomegaly or masses felt. Normal bowel sounds  Central nervous system: Alert and oriented. No focal neurological deficits. Extremities: Symmetric 5 x 5 power. Skin: No rashes, lesions or ulcers Psychiatry: Judgement and insight appear normal. Mood & affect appropriate.     Data Reviewed: I have personally reviewed following labs and imaging studies  CBC:  Recent Labs Lab 06/07/15 2133 06/08/15 0143 06/09/15 0314  WBC 19.5* 19.1* 12.8*  NEUTROABS 17.8* 17.0* 11.0*  HGB 9.0* 8.3* 7.6*  HCT 26.6* 24.3* 22.6*  MCV 79.6 79.7 77.4*  PLT 315 294 311   Basic Metabolic Panel:  Recent Labs Lab 06/07/15 2217 06/08/15 0143 06/09/15 0314  NA 129* 132* 135  K 4.7 4.9 4.9  CL 98* 104 108  CO2 20* 19* 16*  GLUCOSE 131* 127* 90  BUN 47* 46* 49*  CREATININE 3.45* 3.05* 2.93*  CALCIUM 7.7* 7.2* 7.8*  MG  --  1.2*  --   PHOS  --  3.1  --    GFR: Estimated Creatinine Clearance: 16.5 mL/min (by C-G formula based on Cr of 2.93). Liver Function Tests:  Recent Labs Lab 06/07/15 2217 06/08/15 0143 06/09/15 0314  AST 71* 66* 51*  ALT 28 27 24   ALKPHOS 70 64 78  BILITOT 1.7* 1.3* 1.6*  PROT 6.6 6.1* 6.1*  ALBUMIN 2.7* 2.4*  2.3*   No results for input(s): LIPASE, AMYLASE in the last 168 hours. No results for input(s): AMMONIA in the last 168 hours. Coagulation Profile: No results for input(s): INR, PROTIME in the last 168 hours. Cardiac Enzymes: No results for input(s): CKTOTAL, CKMB, CKMBINDEX, TROPONINI in the last 168 hours. BNP (last 3 results) No results for input(s): PROBNP in the last 8760 hours. HbA1C:  Recent Labs  06/08/15 0143  HGBA1C 5.8*   CBG: No results for input(s): GLUCAP in the last 168 hours. Lipid Profile: No results for input(s): CHOL, HDL, LDLCALC, TRIG, CHOLHDL, LDLDIRECT in the last 72 hours. Thyroid Function  Tests: No results for input(s): TSH, T4TOTAL, FREET4, T3FREE, THYROIDAB in the last 72 hours. Anemia Panel:  Recent Labs  06/08/15 0305 06/08/15 0316  VITAMINB12  --  155*  FOLATE 34.3  --   FERRITIN  --  495*  TIBC  --  143*  IRON  --  6*  RETICCTPCT 0.8  --    Urine analysis:    Component Value Date/Time   COLORURINE AMBER* 06/08/2015 0935   APPEARANCEUR TURBID* 06/08/2015 0935   LABSPEC 1.019 06/08/2015 0935   PHURINE 5.0 06/08/2015 0935   GLUCOSEU NEGATIVE 06/08/2015 0935   HGBUR SMALL* 06/08/2015 0935   BILIRUBINUR SMALL* 06/08/2015 0935   KETONESUR NEGATIVE 06/08/2015 0935   PROTEINUR 30* 06/08/2015 0935   NITRITE NEGATIVE 06/08/2015 0935   LEUKOCYTESUR SMALL* 06/08/2015 0935   Sepsis Labs: @LABRCNTIP (procalcitonin:4,lacticidven:4)  ) Recent Results (from the past 240 hour(s))  MRSA PCR Screening     Status: Abnormal   Collection Time: 06/08/15 12:41 AM  Result Value Ref Range Status   MRSA by PCR POSITIVE (A) NEGATIVE Final    Comment:        The GeneXpert MRSA Assay (FDA approved for NASAL specimens only), is one component of a comprehensive MRSA colonization surveillance program. It is not intended to diagnose MRSA infection nor to guide or monitor treatment for MRSA infections. RESULT CALLED TO, READ BACK BY AND VERIFIED WITH: A KROLICZAK RN @ 0410 ON 06/08/15 BY C DAVIS   Urine culture     Status: None   Collection Time: 06/08/15  9:35 AM  Result Value Ref Range Status   Specimen Description URINE, CATHETERIZED  Final   Special Requests NONE  Final   Culture   Final    NO GROWTH 1 DAY Performed at [facility]    Report Status 06/09/2015 FINAL  Final         Radiology Studies: 06/11/2015 Renal  06/09/2015  CLINICAL DATA:  Acute kidney injury.  Inpatient. EXAM: RENAL / URINARY TRACT ULTRASOUND COMPLETE COMPARISON:  08/20/2003 CT abdomen/pelvis. FINDINGS: Right Kidney: Length: 9.5 cm. Mildly echogenic right kidney. No right hydronephrosis.  No right renal mass. Left Kidney: Length: 11.2 cm. Mildly echogenic left kidney. No left hydronephrosis. No left renal mass. Bladder: Bladder is completely collapsed by indwelling Foley catheter and cannot be evaluated. IMPRESSION: 1. No hydronephrosis. 2. Mildly echogenic kidneys, a nonspecific finding indicating renal parenchymal disease of uncertain chronicity. No renal atrophy. 3. Bladder is completely collapsed by indwelling Foley catheter and cannot be evaluated on this scan. Electronically Signed   By: 10/21/2003 M.D.   On: 06/09/2015 08:25   Dg Chest Port 1 View  06/08/2015  CLINICAL DATA:  Hypoxia. EXAM: PORTABLE CHEST 1 VIEW COMPARISON:  June 07, 2015. FINDINGS: Stable cardiomediastinal silhouette. No pneumothorax is noted. Stable mild left basilar subsegmental atelectasis is noted. Increased right  upper lobe airspace opacity is noted concerning for worsening pneumonia. Mild right lower lobe atelectasis or pneumonia is noted as well. IMPRESSION: Stable mild left basilar subsegmental atelectasis. Increased right upper lobe pneumonia is noted. Electronically Signed   By: Lupita Raider, M.D.   On: 06/08/2015 09:07   Dg Chest Port 1 View  06/07/2015  CLINICAL DATA:  Abdominal pain, nausea and vomiting. Hypotension. Low grade fever. Tachycardia. EXAM: PORTABLE CHEST 1 VIEW COMPARISON:  01/01/2014 FINDINGS: Shallow inspiration. Infiltration or atelectasis in both lung bases, greater on the right. Changes may indicate pneumonia. Normal heart size and pulmonary vascularity. No blunting of costophrenic angles. No pneumothorax. Degenerative changes in the shoulders. IMPRESSION: Atelectasis or consolidation in both lung bases, greater on the right. Possible pneumonia. Followup PA and lateral chest X-ray is recommended in 3-4 weeks following trial of antibiotic therapy to ensure resolution and exclude underlying malignancy. Electronically Signed   By: Burman Nieves M.D.   On: 06/07/2015 21:53         Scheduled Meds: . antiseptic oral rinse  7 mL Mouth Rinse BID  . Chlorhexidine Gluconate Cloth  6 each Topical Q0600  . enoxaparin (LOVENOX) injection  30 mg Subcutaneous Q24H  . folic acid  1 mg Oral Daily  . levofloxacin (LEVAQUIN) IV  500 mg Intravenous Q48H  . loratadine  5 mg Oral QHS  . mupirocin ointment  1 application Nasal BID  . valACYclovir  500 mg Oral Daily  . vancomycin  1,000 mg Intravenous Q48H   Continuous Infusions: . sodium chloride 75 mL/hr at 06/09/15 0801     LOS: 2 days    Time spent:    Zannie Cove, MD [facility] Pager [phone]  If 7PM-7AM, please contact night-coverage [email or web address] Password [email or web address] 06/09/2015, 1:58 PM

## 2015-06-10 ENCOUNTER — Inpatient Hospital Stay (HOSPITAL_COMMUNITY): Payer: Medicare Other

## 2015-06-10 DIAGNOSIS — A419 Sepsis, unspecified organism: Principal | ICD-10-CM

## 2015-06-10 LAB — COMPREHENSIVE METABOLIC PANEL
ALT: 24 U/L (ref 14–54)
AST: 53 U/L — ABNORMAL HIGH (ref 15–41)
Albumin: 2.2 g/dL — ABNORMAL LOW (ref 3.5–5.0)
Alkaline Phosphatase: 105 U/L (ref 38–126)
Anion gap: 10 (ref 5–15)
BUN: 38 mg/dL — ABNORMAL HIGH (ref 6–20)
CO2: 17 mmol/L — ABNORMAL LOW (ref 22–32)
Calcium: 8.2 mg/dL — ABNORMAL LOW (ref 8.9–10.3)
Chloride: 109 mmol/L (ref 101–111)
Creatinine, Ser: 2.25 mg/dL — ABNORMAL HIGH (ref 0.44–1.00)
GFR calc Af Amer: 25 mL/min — ABNORMAL LOW (ref >60)
GFR calc non Af Amer: 21 mL/min — ABNORMAL LOW (ref >60)
Glucose, Bld: 97 mg/dL (ref 65–99)
Potassium: 5.1 mmol/L (ref 3.5–5.1)
Sodium: 136 mmol/L (ref 135–145)
Total Bilirubin: 1.4 mg/dL — ABNORMAL HIGH (ref 0.3–1.2)
Total Protein: 6.2 g/dL — ABNORMAL LOW (ref 6.5–8.1)

## 2015-06-10 LAB — CBC WITH DIFFERENTIAL/PLATELET
Basophils Absolute: 0 10*3/uL (ref 0.0–0.1)
Basophils Relative: 0 %
Eosinophils Absolute: 0.1 10*3/uL (ref 0.0–0.7)
Eosinophils Relative: 1 %
HCT: 21.8 % — ABNORMAL LOW (ref 36.0–46.0)
Hemoglobin: 7.5 g/dL — ABNORMAL LOW (ref 12.0–15.0)
Lymphocytes Relative: 10 %
Lymphs Abs: 1.4 10*3/uL (ref 0.7–4.0)
MCH: 26.4 pg (ref 26.0–34.0)
MCHC: 34.4 g/dL (ref 30.0–36.0)
MCV: 76.8 fL — ABNORMAL LOW (ref 78.0–100.0)
Monocytes Absolute: 1.1 10*3/uL — ABNORMAL HIGH (ref 0.1–1.0)
Monocytes Relative: 8 %
Neutro Abs: 10.9 10*3/uL — ABNORMAL HIGH (ref 1.7–7.7)
Neutrophils Relative %: 81 %
Platelets: 344 10*3/uL (ref 150–400)
RBC: 2.84 MIL/uL — ABNORMAL LOW (ref 3.87–5.11)
RDW: 16 % — ABNORMAL HIGH (ref 11.5–15.5)
WBC: 13.5 10*3/uL — ABNORMAL HIGH (ref 4.0–10.5)

## 2015-06-10 LAB — LEGIONELLA PNEUMOPHILA SEROGP 1 UR AG: L. pneumophila Serogp 1 Ur Ag: NEGATIVE

## 2015-06-10 MED ORDER — FUROSEMIDE 10 MG/ML IJ SOLN
40.0000 mg | Freq: Once | INTRAMUSCULAR | Status: AC
  Administered 2015-06-10: 40 mg via INTRAVENOUS
  Filled 2015-06-10: qty 4

## 2015-06-10 MED ORDER — DEXTROSE 5 % IV SOLN
2.0000 g | INTRAVENOUS | Status: DC
  Administered 2015-06-10 – 2015-06-12 (×3): 2 g via INTRAVENOUS
  Filled 2015-06-10 (×5): qty 2

## 2015-06-10 NOTE — Progress Notes (Addendum)
PROGRESS NOTE    Cheyenne Gould  URK:[id number] DOB: 1945-05-22 DOA: 06/07/2015 PCP: Geraldo Pitter, MD Outpatient Specialists:   Brief Narrative: 70/F with history of hypertension, rheumatoid arthritis on remicaide, chronic anemia, GERD admitted with Sepsis, Hypotension, Pneumonia and AKI. 4/19 increasing O2 demand  Assessment & Plan:     Sepsis with hypotension due to pneumonia -On Day 3 of-Vanc/levaquin -repeat CXR  4/19 with increased RUL pneumonia,  -appears dyspneic now, broaden coverage, stop levaquin and change to Cefepime due to worsening infiltrates on CXR -BP improved, stop IVF -lactate down to 1.5 from 3.0 -UA abnormal too, FU urine Cx-NGTD   Bernerd Limbo /ATN due to sepsis -due to sepsis/hypotension and concomitant ARB/HCTZ use -baseline creatinine was 1.0 in 11/15 -improving, creatinine down to 2.2 from 3.4 on admission -urine output better  Anemia  -due to chronic disease, remicaide, iron defi and dilution -monitor, will need transfusion soon likely tomorrow  RA -on remicaide infusions per Dr/Beekman, last in April  HTN -BP improved -stop IVF, holding all BP meds  Hyperglycemia -Hbaic 5.8  DVT prophylaxis: lovenox    Code Status: Full Code Family Communication: No family at bedside Disposition Plan: Keep in SDU, Tx to floor tomorrow   Consultants:      Procedures:   Antimicrobials: Vanc/levaquin 4/18  Subjective: Some dyspnea, but feels better  Objective: Filed Vitals:   06/10/15 0801 06/10/15 0900 06/10/15 1000 06/10/15 1100  BP:  134/67 141/65 121/57  Pulse:  102 103 102  Temp: 100.4 F (38 C)     TempSrc: Oral     Resp:  23 24 25   Height:      Weight:      SpO2:  94% 93% 93%    Intake/Output Summary (Last 24 hours) at 06/10/15 1112 Last data filed at 06/10/15 0600  Gross per 24 hour  Intake   1725 ml  Output   1030 ml  Net    695 ml   Filed Weights   06/07/15 2144  Weight: 68.04 kg (150 lb)    Examination:  General exam:  Appears calm and comfortable, no distress Respiratory system: ronchi and crackles in both lower lungs Cardiovascular system: S1 & S2 heard, RRR. No JVD, murmurs, rubs, gallops or clicks Gastrointestinal system: Abdomen is nondistended, soft and nontender. No organomegaly or masses felt. Normal bowel sounds  Central nervous system: Alert and oriented. No focal neurological deficits. Extremities: Symmetric 5 x 5 power. Skin: No rashes, lesions or ulcers Psychiatry: Judgement and insight appear normal. Mood & affect appropriate.     Data Reviewed: I have personally reviewed following labs and imaging studies  CBC:  Recent Labs Lab 06/07/15 2133 06/08/15 0143 06/09/15 0314 06/10/15 0311  WBC 19.5* 19.1* 12.8* 13.5*  NEUTROABS 17.8* 17.0* 11.0* 10.9*  HGB 9.0* 8.3* 7.6* 7.5*  HCT 26.6* 24.3* 22.6* 21.8*  MCV 79.6 79.7 77.4* 76.8*  PLT 315 294 311 344   Basic Metabolic Panel:  Recent Labs Lab 06/07/15 2217 06/08/15 0143 06/09/15 0314 06/10/15 0311  NA 129* 132* 135 136  K 4.7 4.9 4.9 5.1  CL 98* 104 108 109  CO2 20* 19* 16* 17*  GLUCOSE 131* 127* 90 97  BUN 47* 46* 49* 38*  CREATININE 3.45* 3.05* 2.93* 2.25*  CALCIUM 7.7* 7.2* 7.8* 8.2*  MG  --  1.2*  --   --   PHOS  --  3.1  --   --    GFR: Estimated Creatinine Clearance: 21.5 mL/min (by C-G formula based  on Cr of 2.25). Liver Function Tests:  Recent Labs Lab 06/07/15 2217 06/08/15 0143 06/09/15 0314 06/10/15 0311  AST 71* 66* 51* 53*  ALT 28 27 24 24   ALKPHOS 70 64 78 105  BILITOT 1.7* 1.3* 1.6* 1.4*  PROT 6.6 6.1* 6.1* 6.2*  ALBUMIN 2.7* 2.4* 2.3* 2.2*   No results for input(s): LIPASE, AMYLASE in the last 168 hours. No results for input(s): AMMONIA in the last 168 hours. Coagulation Profile: No results for input(s): INR, PROTIME in the last 168 hours. Cardiac Enzymes: No results for input(s): CKTOTAL, CKMB, CKMBINDEX, TROPONINI in the last 168 hours. BNP (last 3 results) No results for input(s):  PROBNP in the last 8760 hours. HbA1C:  Recent Labs  06/08/15 0143  HGBA1C 5.8*   CBG: No results for input(s): GLUCAP in the last 168 hours. Lipid Profile: No results for input(s): CHOL, HDL, LDLCALC, TRIG, CHOLHDL, LDLDIRECT in the last 72 hours. Thyroid Function Tests: No results for input(s): TSH, T4TOTAL, FREET4, T3FREE, THYROIDAB in the last 72 hours. Anemia Panel:  Recent Labs  06/08/15 0305 06/08/15 0316  VITAMINB12  --  155*  FOLATE 34.3  --   FERRITIN  --  495*  TIBC  --  143*  IRON  --  6*  RETICCTPCT 0.8  --    Urine analysis:    Component Value Date/Time   COLORURINE AMBER* 06/08/2015 0935   APPEARANCEUR TURBID* 06/08/2015 0935   LABSPEC 1.019 06/08/2015 0935   PHURINE 5.0 06/08/2015 0935   GLUCOSEU NEGATIVE 06/08/2015 0935   HGBUR SMALL* 06/08/2015 0935   BILIRUBINUR SMALL* 06/08/2015 0935   KETONESUR NEGATIVE 06/08/2015 0935   PROTEINUR 30* 06/08/2015 0935   NITRITE NEGATIVE 06/08/2015 0935   LEUKOCYTESUR SMALL* 06/08/2015 0935   Sepsis Labs: @LABRCNTIP (procalcitonin:4,lacticidven:4)  ) Recent Results (from the past 240 hour(s))  Blood Culture (routine x 2)     Status: None (Preliminary result)   Collection Time: 06/07/15  9:35 PM  Result Value Ref Range Status   Specimen Description BLOOD RIGHT ANTECUBITAL  Final   Special Requests BOTTLES DRAWN AEROBIC ONLY 9 ML  Final   Culture   Final    NO GROWTH 1 DAY Performed at [facility]    Report Status PENDING  Incomplete  Blood Culture (routine x 2)     Status: None (Preliminary result)   Collection Time: 06/07/15  9:37 PM  Result Value Ref Range Status   Specimen Description BLOOD LEFT HAND  Final   Special Requests IN PEDIATRIC BOTTLE 2 ML  Final   Culture   Final    NO GROWTH 1 DAY Performed at [facility]    Report Status PENDING  Incomplete  MRSA PCR Screening     Status: Abnormal   Collection Time: 06/08/15 12:41 AM  Result Value Ref Range Status   MRSA by PCR  POSITIVE (A) NEGATIVE Final    Comment:        The GeneXpert MRSA Assay (FDA approved for NASAL specimens only), is one component of a comprehensive MRSA colonization surveillance program. It is not intended to diagnose MRSA infection nor to guide or monitor treatment for MRSA infections. RESULT CALLED TO, READ BACK BY AND VERIFIED WITH: A KROLICZAK RN @ 0410 ON 06/08/15 BY C DAVIS   Urine culture     Status: None   Collection Time: 06/08/15  9:35 AM  Result Value Ref Range Status   Specimen Description URINE, CATHETERIZED  Final   Special Requests NONE  Final   Culture   Final    NO GROWTH 1 DAY Performed at [facility]    Report Status 06/09/2015 FINAL  Final         Radiology Studies: US Renal  06/09/2015  CLINICAL DATA:  Acute kidney injury.  Inpatient. EXAM: RENAL / URINARY TRACT ULTRASOUND COMPLETE COMPARISON:  08/20/2003 CT abdomen/pelvis. FINDINGS: Right Kidney: Length: 9.5 cm. Mildly echogenic right kidney. No right hydronephrosis. No right renal mass. Left Kidney: Length: 11.2 cm. Mildly echogenic left kidney. No left hydronephrosis. No left renal mass. Bladder: Bladder is completely collapsed by indwelling Foley catheter and cannot be evaluated. IMPRESSION: 1. No hydronephrosis. 2. Mildly echogenic kidneys, a nonspecific finding indicating renal parenchymal disease of uncertain chronicity. No renal atrophy. 3. Bladder is completely collapsed by indwelling Foley catheter and cannot be evaluated on this scan. Electronically Signed   By: Delbert Phenix M.D.   On: 06/09/2015 08:25        Scheduled Meds: . antiseptic oral rinse  7 mL Mouth Rinse BID  . Chlorhexidine Gluconate Cloth  6 each Topical Q0600  . enoxaparin (LOVENOX) injection  30 mg Subcutaneous Q24H  . folic acid  1 mg Oral Daily  . levofloxacin (LEVAQUIN) IV  500 mg Intravenous Q48H  . loratadine  5 mg Oral QHS  . mupirocin ointment  1 application Nasal BID  . valACYclovir  500 mg Oral Daily  .  vancomycin  1,000 mg Intravenous Q48H   Continuous Infusions:     LOS: 3 days    Time spent:    Zannie Cove, MD [facility] Pager [phone]  If 7PM-7AM, please contact night-coverage [email or web address] Password [email or web address] 06/10/2015, 11:12 AM

## 2015-06-10 NOTE — Progress Notes (Signed)
Pharmacy Antibiotic Note  Cheyenne Gould is a 70 y.o. female admitted on 06/07/2015 with sepsis. Patient's currently on abx day #2 for PNA.  Most recent CXR showed worsening of PNA.  To change levaquin to cefepime.  Dr.Joseph is aware of PCN allergy.  - 4/19 CXR: Increased RUL pneumonia is noted.   4/21 CXR: BILATERAL pulmonary infiltrates increased in RIGHT upper lobe                      and new in LEFT upper lobe since previous exam - Tmax 100.4, WBC elevated 13.5;  AKI, SCr trending down 2.25 (CrCl ~21)   Plan: - continue vancomycin 1000 mg IV q48h - cefepime 2 gm IV q24h - check BMP on 4/22 --? If renal function continues to improve, will plan on checking vancomycin trough level assess drug clearance - f/u cultures, renal function, and clinical status ______________________________  Height: 5' 4.96" (165 cm) Weight: 150 lb (68.04 kg) IBW/kg (Calculated) : 56.91  Temp (24hrs), Avg:99.6 F (37.6 C), Min:98.5 F (36.9 C), Max:100.4 F (38 C)   Recent Labs Lab 06/07/15 2132 06/07/15 2133 06/07/15 2217 06/08/15 0143 06/09/15 0314 06/10/15 0311  WBC  --  19.5*  --  19.1* 12.8* 13.5*  CREATININE  --   --  3.45* 3.05* 2.93* 2.25*  LATICACIDVEN 3.07*  --   --  1.5  --   --     Estimated Creatinine Clearance: 21.5 mL/min (by C-G formula based on Cr of 2.25).    Allergies  Allergen Reactions  . Peanut-Containing Drug Products Anaphylaxis  . Shellfish Allergy Anaphylaxis  . Lyrica [Pregabalin] Other (See Comments)    Causes rheumatoid flares  . Penicillins Other (See Comments)    Corners of mouth started splitting  . Sulfa Antibiotics Other (See Comments)    unknown  . Leflunomide Rash   Antimicrobials this admission: 4/18 levaquin >> 4/21 4/18 vancomycin >>  4/21 cefepime>>  Dose adjustments this admission: n/a  Microbiology results: 4/18 BCx x2:  4/19 UCx: NGF 4/19 MRSA PCR: positive on chlx + bactroban   Thank you for allowing pharmacy to be a part of this  patient's care.   Dorna Leitz, PharmD, BCPS 06/10/2015 1:23 PM

## 2015-06-11 DIAGNOSIS — I10 Essential (primary) hypertension: Secondary | ICD-10-CM

## 2015-06-11 LAB — CBC
HCT: 20.6 % — ABNORMAL LOW (ref 36.0–46.0)
Hemoglobin: 7.2 g/dL — ABNORMAL LOW (ref 12.0–15.0)
MCH: 26.5 pg (ref 26.0–34.0)
MCHC: 35 g/dL (ref 30.0–36.0)
MCV: 75.7 fL — ABNORMAL LOW (ref 78.0–100.0)
Platelets: 378 10*3/uL (ref 150–400)
RBC: 2.72 MIL/uL — ABNORMAL LOW (ref 3.87–5.11)
RDW: 16 % — ABNORMAL HIGH (ref 11.5–15.5)
WBC: 15.5 10*3/uL — ABNORMAL HIGH (ref 4.0–10.5)

## 2015-06-11 LAB — BASIC METABOLIC PANEL
Anion gap: 8 (ref 5–15)
BUN: 34 mg/dL — ABNORMAL HIGH (ref 6–20)
CO2: 21 mmol/L — ABNORMAL LOW (ref 22–32)
Calcium: 8.4 mg/dL — ABNORMAL LOW (ref 8.9–10.3)
Chloride: 110 mmol/L (ref 101–111)
Creatinine, Ser: 1.89 mg/dL — ABNORMAL HIGH (ref 0.44–1.00)
GFR calc Af Amer: 30 mL/min — ABNORMAL LOW (ref >60)
GFR calc non Af Amer: 26 mL/min — ABNORMAL LOW (ref >60)
Glucose, Bld: 117 mg/dL — ABNORMAL HIGH (ref 65–99)
Potassium: 4.5 mmol/L (ref 3.5–5.1)
Sodium: 139 mmol/L (ref 135–145)

## 2015-06-11 LAB — PREPARE RBC (CROSSMATCH)

## 2015-06-11 MED ORDER — SODIUM CHLORIDE 0.9 % IV SOLN: Freq: Once | INTRAVENOUS | Status: AC

## 2015-06-11 NOTE — Progress Notes (Signed)
Received patient from ICU, blood transfusion ongoing @125ml /hr, foley catheter in place, pt is alert and oriented ,weak.

## 2015-06-11 NOTE — Progress Notes (Signed)
Cheyenne Gould 1240 transferred to 1421 in w/chair with o2 and Dianne NT. No change in condition. PRBC'S infusing. Report was called to [employer].

## 2015-06-11 NOTE — Progress Notes (Signed)
PROGRESS NOTE    Cheyenne Gould  LKG:[id number] DOB: 1946/01/24 DOA: 06/07/2015 PCP: Geraldo Pitter, MD Outpatient Specialists:   Brief Narrative: 70/F with history of hypertension, rheumatoid arthritis on remicaide, chronic anemia, GERD admitted with Sepsis, Hypotension, Pneumonia and AKI. 4/19 increasing O2 demand  Assessment & Plan:     Sepsis with hypotension due to pneumonia -On Day 4 of-Vanc, levaquin changed to cefepime 4/21 due to worsening infiltrates -repeat CXR  4/21 with increased RUL pneumonia,  -stable now, Blood Cx negative -lactate down to 1.5 from 3.0 -UA abnormal too, FU urine Cx-NGTD -Stop Vanc today   AKi /ATN due to sepsis -due to sepsis/hypotension and concomitant ARB/HCTZ use -baseline creatinine was 1.0 in 11/15 -improving, creatinine down to 2.2 from 3.4 on admission -urine output better, creatinine down to 1.8 now  Anemia  -due to chronic disease, remicaide, iron defi and dilution -monitor, will need transfusion today  RA -on remicaide infusions per Dr/Beekman, last in April  HTN -BP improved -stop IVF, holding all BP meds  Hyperglycemia -Hbaic 5.8  DVT prophylaxis: lovenox    Code Status: Full Code Family Communication: No family at bedside Disposition Plan:  Tx to tele, home in few days  Consultants:      Procedures:   Antimicrobials: Vanc/levaquin 4/18  Subjective: Feels better, slept well  Objective: Filed Vitals:   06/11/15 0948 06/11/15 1000 06/11/15 1025 06/11/15 1027  BP: 126/59 118/49    Pulse: 94 95    Temp: 98.9 F (37.2 C)     TempSrc: Oral     Resp: 28 26 25 24   Height:      Weight:      SpO2: 92% 93%      Intake/Output Summary (Last 24 hours) at 06/11/15 1050 Last data filed at 06/11/15 1000  Gross per 24 hour  Intake     90 ml  Output   2700 ml  Net  -2610 ml   Filed Weights   06/07/15 2144  Weight: 68.04 kg (150 lb)    Examination:  General exam: Appears calm and comfortable, no  distress Respiratory system: ronchi and crackles in both lower lungs Cardiovascular system: S1 & S2 heard, RRR. No JVD, murmurs, rubs, gallops or clicks Gastrointestinal system: Abdomen is nondistended, soft and nontender. No organomegaly or masses felt. Normal bowel sounds  Central nervous system: Alert and oriented. No focal neurological deficits. Extremities: Symmetric 5 x 5 power. Skin: No rashes, lesions or ulcers Psychiatry: Judgement and insight appear normal. Mood & affect appropriate.     Data Reviewed: I have personally reviewed following labs and imaging studies  CBC:  Recent Labs Lab 06/07/15 2133 06/08/15 0143 06/09/15 0314 06/10/15 0311 06/11/15 0327  WBC 19.5* 19.1* 12.8* 13.5* 15.5*  NEUTROABS 17.8* 17.0* 11.0* 10.9*  --   HGB 9.0* 8.3* 7.6* 7.5* 7.2*  HCT 26.6* 24.3* 22.6* 21.8* 20.6*  MCV 79.6 79.7 77.4* 76.8* 75.7*  PLT 315 294 311 344 378   Basic Metabolic Panel:  Recent Labs Lab 06/07/15 2217 06/08/15 0143 06/09/15 0314 06/10/15 0311 06/11/15 0327  NA 129* 132* 135 136 139  K 4.7 4.9 4.9 5.1 4.5  CL 98* 104 108 109 110  CO2 20* 19* 16* 17* 21*  GLUCOSE 131* 127* 90 97 117*  BUN 47* 46* 49* 38* 34*  CREATININE 3.45* 3.05* 2.93* 2.25* 1.89*  CALCIUM 7.7* 7.2* 7.8* 8.2* 8.4*  MG  --  1.2*  --   --   --   PHOS  --  3.1  --   --   --    GFR: Estimated Creatinine Clearance: 25.6 mL/min (by C-G formula based on Cr of 1.89). Liver Function Tests:  Recent Labs Lab 06/07/15 2217 06/08/15 0143 06/09/15 0314 06/10/15 0311  AST 71* 66* 51* 53*  ALT 28 27 24 24   ALKPHOS 70 64 78 105  BILITOT 1.7* 1.3* 1.6* 1.4*  PROT 6.6 6.1* 6.1* 6.2*  ALBUMIN 2.7* 2.4* 2.3* 2.2*   No results for input(s): LIPASE, AMYLASE in the last 168 hours. No results for input(s): AMMONIA in the last 168 hours. Coagulation Profile: No results for input(s): INR, PROTIME in the last 168 hours. Cardiac Enzymes: No results for input(s): CKTOTAL, CKMB, CKMBINDEX,  TROPONINI in the last 168 hours. BNP (last 3 results) No results for input(s): PROBNP in the last 8760 hours. HbA1C: No results for input(s): HGBA1C in the last 72 hours. CBG: No results for input(s): GLUCAP in the last 168 hours. Lipid Profile: No results for input(s): CHOL, HDL, LDLCALC, TRIG, CHOLHDL, LDLDIRECT in the last 72 hours. Thyroid Function Tests: No results for input(s): TSH, T4TOTAL, FREET4, T3FREE, THYROIDAB in the last 72 hours. Anemia Panel: No results for input(s): VITAMINB12, FOLATE, FERRITIN, TIBC, IRON, RETICCTPCT in the last 72 hours. Urine analysis:    Component Value Date/Time   COLORURINE AMBER* 06/08/2015 0935   APPEARANCEUR TURBID* 06/08/2015 0935   LABSPEC 1.019 06/08/2015 0935   PHURINE 5.0 06/08/2015 0935   GLUCOSEU NEGATIVE 06/08/2015 0935   HGBUR SMALL* 06/08/2015 0935   BILIRUBINUR SMALL* 06/08/2015 0935   KETONESUR NEGATIVE 06/08/2015 0935   PROTEINUR 30* 06/08/2015 0935   NITRITE NEGATIVE 06/08/2015 0935   LEUKOCYTESUR SMALL* 06/08/2015 0935   Sepsis Labs: @LABRCNTIP (procalcitonin:4,lacticidven:4)  ) Recent Results (from the past 240 hour(s))  Blood Culture (routine x 2)     Status: None (Preliminary result)   Collection Time: 06/07/15  9:35 PM  Result Value Ref Range Status   Specimen Description BLOOD RIGHT ANTECUBITAL  Final   Special Requests BOTTLES DRAWN AEROBIC ONLY 9 ML  Final   Culture   Final    NO GROWTH 3 DAYS Performed at [facility]    Report Status PENDING  Incomplete  Blood Culture (routine x 2)     Status: None (Preliminary result)   Collection Time: 06/07/15  9:37 PM  Result Value Ref Range Status   Specimen Description BLOOD LEFT HAND  Final   Special Requests IN PEDIATRIC BOTTLE 2 ML  Final   Culture   Final    NO GROWTH 3 DAYS Performed at [facility]    Report Status PENDING  Incomplete  MRSA PCR Screening     Status: Abnormal   Collection Time: 06/08/15 12:41 AM  Result Value Ref Range  Status   MRSA by PCR POSITIVE (A) NEGATIVE Final    Comment:        The GeneXpert MRSA Assay (FDA approved for NASAL specimens only), is one component of a comprehensive MRSA colonization surveillance program. It is not intended to diagnose MRSA infection nor to guide or monitor treatment for MRSA infections. RESULT CALLED TO, READ BACK BY AND VERIFIED WITH: A KROLICZAK RN @ 0410 ON 06/08/15 BY C DAVIS   Urine culture     Status: None   Collection Time: 06/08/15  9:35 AM  Result Value Ref Range Status   Specimen Description URINE, CATHETERIZED  Final   Special Requests NONE  Final   Culture   Final  NO GROWTH 1 DAY Performed at [facility]    Report Status 06/09/2015 FINAL  Final         Radiology Studies: Dg Chest 2 View  06/10/2015  CLINICAL DATA:  Hypoxia, hypertension, former smoker EXAM: CHEST  2 VIEW COMPARISON:  06/08/2015 FINDINGS: Stable heart size and mediastinal contours. Consolidation identified in RIGHT upper lobe and RIGHT lower lobe with suspected basilar pleural effusion. Minimal infiltrate developing in LEFT upper lobe as well. No pneumothorax. Bones unremarkable. IMPRESSION: BILATERAL pulmonary infiltrates increased in RIGHT upper lobe and new in LEFT upper lobe since previous exam. Electronically Signed   By: Ulyses Southward M.D.   On: 06/10/2015 12:41        Scheduled Meds: . antiseptic oral rinse  7 mL Mouth Rinse BID  . ceFEPime (MAXIPIME) IV  2 g Intravenous Q24H  . Chlorhexidine Gluconate Cloth  6 each Topical Q0600  . enoxaparin (LOVENOX) injection  30 mg Subcutaneous Q24H  . folic acid  1 mg Oral Daily  . loratadine  5 mg Oral QHS  . mupirocin ointment  1 application Nasal BID  . valACYclovir  500 mg Oral Daily  . vancomycin  1,000 mg Intravenous Q48H   Continuous Infusions:     LOS: 4 days    Time spent:    Zannie Cove, MD [facility] Pager [phone]  If 7PM-7AM, please contact  night-coverage [email or web address] Password [email or web address] 06/11/2015, 10:50 AM

## 2015-06-12 LAB — CBC
HCT: 22.9 % — ABNORMAL LOW (ref 36.0–46.0)
Hemoglobin: 8 g/dL — ABNORMAL LOW (ref 12.0–15.0)
MCH: 26.5 pg (ref 26.0–34.0)
MCHC: 34.9 g/dL (ref 30.0–36.0)
MCV: 75.8 fL — ABNORMAL LOW (ref 78.0–100.0)
Platelets: 416 10*3/uL — ABNORMAL HIGH (ref 150–400)
RBC: 3.02 MIL/uL — ABNORMAL LOW (ref 3.87–5.11)
RDW: 16 % — ABNORMAL HIGH (ref 11.5–15.5)
WBC: 16.1 10*3/uL — ABNORMAL HIGH (ref 4.0–10.5)

## 2015-06-12 LAB — BASIC METABOLIC PANEL
Anion gap: 9 (ref 5–15)
BUN: 29 mg/dL — ABNORMAL HIGH (ref 6–20)
CO2: 20 mmol/L — ABNORMAL LOW (ref 22–32)
Calcium: 8.4 mg/dL — ABNORMAL LOW (ref 8.9–10.3)
Chloride: 108 mmol/L (ref 101–111)
Creatinine, Ser: 1.45 mg/dL — ABNORMAL HIGH (ref 0.44–1.00)
GFR calc Af Amer: 42 mL/min — ABNORMAL LOW (ref >60)
GFR calc non Af Amer: 36 mL/min — ABNORMAL LOW (ref >60)
Glucose, Bld: 108 mg/dL — ABNORMAL HIGH (ref 65–99)
Potassium: 4.7 mmol/L (ref 3.5–5.1)
Sodium: 137 mmol/L (ref 135–145)

## 2015-06-12 MED ORDER — ENOXAPARIN SODIUM 40 MG/0.4ML ~~LOC~~ SOLN
40.0000 mg | SUBCUTANEOUS | Status: DC
  Administered 2015-06-13 – 2015-06-14 (×2): 40 mg via SUBCUTANEOUS
  Filled 2015-06-12 (×2): qty 0.4

## 2015-06-12 MED ORDER — BISACODYL 10 MG RE SUPP
10.0000 mg | Freq: Once | RECTAL | Status: AC
  Administered 2015-06-12: 10 mg via RECTAL
  Filled 2015-06-12: qty 1

## 2015-06-12 MED ORDER — FUROSEMIDE 10 MG/ML IJ SOLN
40.0000 mg | Freq: Once | INTRAMUSCULAR | Status: AC
  Administered 2015-06-12: 40 mg via INTRAVENOUS
  Filled 2015-06-12: qty 4

## 2015-06-12 NOTE — Progress Notes (Signed)
PROGRESS NOTE    Cheyenne Gould  WHQ:[id number] DOB: 1945-12-01 DOA: 06/07/2015 PCP: Geraldo Pitter, MD Outpatient Specialists:   Brief Narrative: 70/F with history of hypertension, rheumatoid arthritis on remicaide, chronic anemia, GERD admitted with Sepsis, Hypotension, Pneumonia and AKI. 4/19 increasing O2 demand. 4/21: Abx changed to cefepime, due to worsening infiltrates 4/22: 1 unit PRBC   Assessment & Plan:     Sepsis with hypotension due to pneumonia -s/p 4days of-Vanc, levaquin changed to cefepime 4/21 due to worsening infiltrates -repeat CXR  4/21 with increased RUL pneumonia,  -stable now, Blood Cx negative -lactate down to 1.5 from 3.0 -UA abnormal too, FU urine Cx-NGTD -Stopped Vanc 4/22, O2 needs improving -iatrogenic fluid overload from IVF and Blood-will give lasix x1 today -ambulate, PT eval   AKi /ATN due to sepsis -due to sepsis/hypotension and concomitant ARB/HCTZ use -baseline creatinine was 1.0 in 11/15 -improving, creatinine down to 2.2 from 3.4 on admission -urine output good, creatinine down to 1.4 now  Anemia  -due to chronic disease, remicaide, iron defi and dilution -monitor, s/p 1 unit PRBC 4/22  RA -on remicaide infusions per Dr/Beekman, last in April  HTN -BP improved -stop IVF, holding all BP meds  Hyperglycemia -Hbaic 5.8  DVT prophylaxis: lovenox    Code Status: Full Code Family Communication: No family at bedside Disposition Plan:  Tx to tele, home in few days  Consultants:      Procedures:   Antimicrobials: Vanc/levaquin 4/18  Subjective: Feels better, slept well  Objective: Filed Vitals:   06/11/15 1050 06/11/15 1145 06/11/15 2102 06/12/15 0644  BP: 138/73 118/64 121/67 110/62  Pulse: 100 95 102 90  Temp: 99.1 F (37.3 C) 98.4 F (36.9 C) 98.4 F (36.9 C) 98 F (36.7 C)  TempSrc: Oral Oral Oral Oral  Resp: 24 22 22 20   Height: 5\' 6"  (1.676 m)     Weight:      SpO2: 92% 95% 92% 99%    Intake/Output  Summary (Last 24 hours) at 06/12/15 1108 Last data filed at 06/12/15 0600  Gross per 24 hour  Intake    715 ml  Output    750 ml  Net    -35 ml   Filed Weights   06/07/15 2144  Weight: 68.04 kg (150 lb)    Examination:  General exam: Appears calm and comfortable, no distress Respiratory system: ronchi and crackles in both lower lungs Cardiovascular system: S1 & S2 heard, RRR. No JVD, murmurs, rubs, gallops or clicks Gastrointestinal system: Abdomen is nondistended, soft and nontender. No organomegaly or masses felt. Normal bowel sounds  Central nervous system: Alert and oriented. No focal neurological deficits. Extremities: Symmetric 5 x 5 power. Skin: No rashes, lesions or ulcers Psychiatry: Judgement and insight appear normal. Mood & affect appropriate.     Data Reviewed: I have personally reviewed following labs and imaging studies  CBC:  Recent Labs Lab 06/07/15 2133 06/08/15 0143 06/09/15 0314 06/10/15 0311 06/11/15 0327 06/12/15 0530  WBC 19.5* 19.1* 12.8* 13.5* 15.5* 16.1*  NEUTROABS 17.8* 17.0* 11.0* 10.9*  --   --   HGB 9.0* 8.3* 7.6* 7.5* 7.2* 8.0*  HCT 26.6* 24.3* 22.6* 21.8* 20.6* 22.9*  MCV 79.6 79.7 77.4* 76.8* 75.7* 75.8*  PLT 315 294 311 344 378 416*   Basic Metabolic Panel:  Recent Labs Lab 06/08/15 0143 06/09/15 0314 06/10/15 0311 06/11/15 0327 06/12/15 0530  NA 132* 135 136 139 137  K 4.9 4.9 5.1 4.5 4.7  CL 104 108 109  110 108  CO2 19* 16* 17* 21* 20*  GLUCOSE 127* 90 97 117* 108*  BUN 46* 49* 38* 34* 29*  CREATININE 3.05* 2.93* 2.25* 1.89* 1.45*  CALCIUM 7.2* 7.8* 8.2* 8.4* 8.4*  MG 1.2*  --   --   --   --   PHOS 3.1  --   --   --   --    GFR: Estimated Creatinine Clearance: 34.8 mL/min (by C-G formula based on Cr of 1.45). Liver Function Tests:  Recent Labs Lab 06/07/15 2217 06/08/15 0143 06/09/15 0314 06/10/15 0311  AST 71* 66* 51* 53*  ALT 28 27 24 24   ALKPHOS 70 64 78 105  BILITOT 1.7* 1.3* 1.6* 1.4*  PROT 6.6 6.1*  6.1* 6.2*  ALBUMIN 2.7* 2.4* 2.3* 2.2*   No results for input(s): LIPASE, AMYLASE in the last 168 hours. No results for input(s): AMMONIA in the last 168 hours. Coagulation Profile: No results for input(s): INR, PROTIME in the last 168 hours. Cardiac Enzymes: No results for input(s): CKTOTAL, CKMB, CKMBINDEX, TROPONINI in the last 168 hours. BNP (last 3 results) No results for input(s): PROBNP in the last 8760 hours. HbA1C: No results for input(s): HGBA1C in the last 72 hours. CBG: No results for input(s): GLUCAP in the last 168 hours. Lipid Profile: No results for input(s): CHOL, HDL, LDLCALC, TRIG, CHOLHDL, LDLDIRECT in the last 72 hours. Thyroid Function Tests: No results for input(s): TSH, T4TOTAL, FREET4, T3FREE, THYROIDAB in the last 72 hours. Anemia Panel: No results for input(s): VITAMINB12, FOLATE, FERRITIN, TIBC, IRON, RETICCTPCT in the last 72 hours. Urine analysis:    Component Value Date/Time   COLORURINE AMBER* 06/08/2015 0935   APPEARANCEUR TURBID* 06/08/2015 0935   LABSPEC 1.019 06/08/2015 0935   PHURINE 5.0 06/08/2015 0935   GLUCOSEU NEGATIVE 06/08/2015 0935   HGBUR SMALL* 06/08/2015 0935   BILIRUBINUR SMALL* 06/08/2015 0935   KETONESUR NEGATIVE 06/08/2015 0935   PROTEINUR 30* 06/08/2015 0935   NITRITE NEGATIVE 06/08/2015 0935   LEUKOCYTESUR SMALL* 06/08/2015 0935   Sepsis Labs: @LABRCNTIP (procalcitonin:4,lacticidven:4)  ) Recent Results (from the past 240 hour(s))  Blood Culture (routine x 2)     Status: None (Preliminary result)   Collection Time: 06/07/15  9:35 PM  Result Value Ref Range Status   Specimen Description BLOOD RIGHT ANTECUBITAL  Final   Special Requests BOTTLES DRAWN AEROBIC ONLY 9 ML  Final   Culture   Final    NO GROWTH 3 DAYS Performed at [facility]    Report Status PENDING  Incomplete  Blood Culture (routine x 2)     Status: None (Preliminary result)   Collection Time: 06/07/15  9:37 PM  Result Value Ref Range Status    Specimen Description BLOOD LEFT HAND  Final   Special Requests IN PEDIATRIC BOTTLE 2 ML  Final   Culture   Final    NO GROWTH 3 DAYS Performed at [facility]    Report Status PENDING  Incomplete  MRSA PCR Screening     Status: Abnormal   Collection Time: 06/08/15 12:41 AM  Result Value Ref Range Status   MRSA by PCR POSITIVE (A) NEGATIVE Final    Comment:        The GeneXpert MRSA Assay (FDA approved for NASAL specimens only), is one component of a comprehensive MRSA colonization surveillance program. It is not intended to diagnose MRSA infection nor to guide or monitor treatment for MRSA infections. RESULT CALLED TO, READ BACK BY AND VERIFIED WITH:  A KROLICZAK RN @ 0410 ON 06/08/15 BY C DAVIS   Urine culture     Status: None   Collection Time: 06/08/15  9:35 AM  Result Value Ref Range Status   Specimen Description URINE, CATHETERIZED  Final   Special Requests NONE  Final   Culture   Final    NO GROWTH 1 DAY Performed at [facility]    Report Status 06/09/2015 FINAL  Final         Radiology Studies: Dg Chest 2 View  06/10/2015  CLINICAL DATA:  Hypoxia, hypertension, former smoker EXAM: CHEST  2 VIEW COMPARISON:  06/08/2015 FINDINGS: Stable heart size and mediastinal contours. Consolidation identified in RIGHT upper lobe and RIGHT lower lobe with suspected basilar pleural effusion. Minimal infiltrate developing in LEFT upper lobe as well. No pneumothorax. Bones unremarkable. IMPRESSION: BILATERAL pulmonary infiltrates increased in RIGHT upper lobe and new in LEFT upper lobe since previous exam. Electronically Signed   By: Ulyses Southward M.D.   On: 06/10/2015 12:41        Scheduled Meds: . antiseptic oral rinse  7 mL Mouth Rinse BID  . ceFEPime (MAXIPIME) IV  2 g Intravenous Q24H  . Chlorhexidine Gluconate Cloth  6 each Topical Q0600  . enoxaparin (LOVENOX) injection  30 mg Subcutaneous Q24H  . folic acid  1 mg Oral Daily  . loratadine  5 mg Oral  QHS  . mupirocin ointment  1 application Nasal BID  . valACYclovir  500 mg Oral Daily   Continuous Infusions:     LOS: 5 days    Time spent:    Zannie Cove, MD [facility] Pager [phone]  If 7PM-7AM, please contact night-coverage [email or web address] Password [email or web address] 06/12/2015, 11:08 AM

## 2015-06-12 NOTE — Evaluation (Signed)
Physical Therapy Evaluation Patient Details Name: Cheyenne Gould MRN: [id number] DOB: 09/18/1945 Today's Date: 06/12/2015   History of Present Illness  70/F with history of hypertension, rheumatoid arthritis on remicaide, chronic anemia, GERD admitted with Sepsis, Hypotension, Pneumonia and AKI. and 2 units PRBC, and increasing infiltrates requiring more O2 during her hopsitalization   Clinical Impression  Pt with generalized weakness from medical issues and hospitalization, to benefit from PT to increase strength, balance, activity tolerance and mobility in order to discharge home alone. Pt resting at 98% on 2L [place] Oxygen, however with activities in room, to from bathroom 2xs and walking in room, patient remained between 90-96% , lowest was 89% , however after 20 seconds of pursed lip breathing , pt responded back up to 96% fairly quickly. Will continue to work with patient.     Follow Up Recommendations Home health PT (or no PT depends on how pt progresses here )    Equipment Recommendations  None recommended by PT (depends on how pt progresses)    Recommendations for Other Services       Precautions / Restrictions Precautions Precautions: None      Mobility  Bed Mobility Overal bed mobility: Modified Independent                Transfers Overall transfer level: Needs assistance Equipment used: 1 person hand held assist Transfers: Sit to/from Stand Sit to Stand: Min guard         General transfer comment: to steady due to still feelign weak, slightly dizzy and unsteady   Ambulation/Gait Ambulation/Gait assistance: Min assist Ambulation Distance (Feet): 10 Feet (2xs) Assistive device: 1 person hand held assist Gait Pattern/deviations: Step-through pattern     General Gait Details: small steps slow gait in room due to first itmes up and feelign weak still   Stairs            Wheelchair Mobility    Modified Rankin (Stroke Patients Only)       Balance  Overall balance assessment: Needs assistance Sitting-balance support: Feet supported;No upper extremity supported Sitting balance-Leahy Scale: Normal     Standing balance support: Single extremity supported Standing balance-Leahy Scale: Fair                               Pertinent Vitals/Pain Pain Assessment: 0-10 Pain Score: 5  Pain Location: R anterior rib area, pt stated where her pna is  Pain Descriptors / Indicators: Aching (esepcially with moving and coughing ) Pain Intervention(s): Monitored during session    Home Living Family/patient expects to be discharged to:: Private residence Living Arrangements: Alone Available Help at Discharge: Family;Friend(s);Available PRN/intermittently Type of Home: House Home Access: Level entry     Home Layout: One level Home Equipment: None      Prior Function Level of Independence: Independent         Comments: very independent and 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        Extremity/Trunk Assessment               Lower Extremity Assessment: Generalized weakness         Communication   Communication: No difficulties  Cognition Arousal/Alertness: Awake/alert Behavior During Therapy: WFL for tasks assessed/performed Overall Cognitive Status: Within Functional Limits for tasks assessed  General Comments      Exercises Other Exercises Other Exercises: reviewed incentive spirameter 5x and pursed lip breathing as well to increase oxygen saturation and her oxygen satuurations were very respondant to both of these exercises      Assessment/Plan    PT Assessment Patient needs continued PT services  PT Diagnosis Difficulty walking;Generalized weakness   PT Problem List Decreased strength;Decreased activity tolerance;Decreased mobility  PT Treatment Interventions Gait training;Functional mobility training;Therapeutic  activities;Therapeutic exercise;Patient/family education   PT Goals (Current goals can be found in the Care Plan section) Acute Rehab PT Goals Patient Stated Goal: I would like to get better and be able to go home  PT Goal Formulation: With patient Time For Goal Achievement: 06/25/15 Potential to Achieve Goals: Good    Frequency Min 3X/week   Barriers to discharge        Co-evaluation               End of Session   Activity Tolerance: Patient tolerated treatment well Patient left: in bed;with call bell/phone within reach;with bed alarm set Nurse Communication: Mobility status         Time: 6384-6659 PT Time Calculation (min) (ACUTE ONLY): 29 min   Charges:     PT Treatments $Gait Training: 8-22 mins $Therapeutic Activity: 8-22 mins   PT G CodesMarella Gould June 17, 2015, 4:45 PM Cheyenne Gould, PT Pager: [phone] 17-Jun-2015

## 2015-06-13 LAB — CULTURE, BLOOD (ROUTINE X 2)
Culture: NO GROWTH
Culture: NO GROWTH

## 2015-06-13 LAB — CBC
HCT: 22.2 % — ABNORMAL LOW (ref 36.0–46.0)
Hemoglobin: 7.7 g/dL — ABNORMAL LOW (ref 12.0–15.0)
MCH: 26.5 pg (ref 26.0–34.0)
MCHC: 34.7 g/dL (ref 30.0–36.0)
MCV: 76.3 fL — ABNORMAL LOW (ref 78.0–100.0)
Platelets: 433 10*3/uL — ABNORMAL HIGH (ref 150–400)
RBC: 2.91 MIL/uL — ABNORMAL LOW (ref 3.87–5.11)
RDW: 16 % — ABNORMAL HIGH (ref 11.5–15.5)
WBC: 14.8 10*3/uL — ABNORMAL HIGH (ref 4.0–10.5)

## 2015-06-13 LAB — BASIC METABOLIC PANEL
Anion gap: 9 (ref 5–15)
BUN: 26 mg/dL — ABNORMAL HIGH (ref 6–20)
CO2: 22 mmol/L (ref 22–32)
Calcium: 8.5 mg/dL — ABNORMAL LOW (ref 8.9–10.3)
Chloride: 109 mmol/L (ref 101–111)
Creatinine, Ser: 1.29 mg/dL — ABNORMAL HIGH (ref 0.44–1.00)
GFR calc Af Amer: 48 mL/min — ABNORMAL LOW (ref >60)
GFR calc non Af Amer: 42 mL/min — ABNORMAL LOW (ref >60)
Glucose, Bld: 97 mg/dL (ref 65–99)
Potassium: 3.9 mmol/L (ref 3.5–5.1)
Sodium: 140 mmol/L (ref 135–145)

## 2015-06-13 LAB — TYPE AND SCREEN
ABO/RH(D): O POS
Antibody Screen: NEGATIVE
Unit division: 0

## 2015-06-13 LAB — PREPARE RBC (CROSSMATCH)

## 2015-06-13 MED ORDER — CEFPODOXIME PROXETIL 200 MG PO TABS
200.0000 mg | ORAL_TABLET | Freq: Two times a day (BID) | ORAL | Status: DC
  Administered 2015-06-13 – 2015-06-14 (×3): 200 mg via ORAL
  Filled 2015-06-13 (×3): qty 1

## 2015-06-13 MED ORDER — FUROSEMIDE 10 MG/ML IJ SOLN
20.0000 mg | Freq: Once | INTRAMUSCULAR | Status: AC
  Administered 2015-06-13: 20 mg via INTRAVENOUS
  Filled 2015-06-13: qty 2

## 2015-06-13 MED ORDER — SODIUM CHLORIDE 0.9 % IV SOLN
Freq: Once | INTRAVENOUS | Status: AC
  Administered 2015-06-13: 11:00:00 via INTRAVENOUS

## 2015-06-13 NOTE — Care Management Note (Signed)
Case Management Note  Patient Details  Name: Cheyenne Gould MRN: [id number] Date of Birth: 1945/06/08  Subjective/Objective: Asked to discuss about additional services for home. Spoke to son per patient permission in rm about custodial level services-provided private duty care agency list-independent decision, out of pocket expense-patient/son voiced understanding. No additional HHC orders per attending-only HHPT, HHA not recc by PT on eval.                  Action/Plan:d/c home w/HHC.   Expected Discharge Date:                  Expected Discharge Plan:  Home w Home Health Services  In-House Referral:  NA  Discharge planning Services  CM Consult  Post Acute Care Choice:  NA Choice offered to:  Patient  DME Arranged:    DME Agency:     HH Arranged:  PT HH Agency:  [facility]  Status of Service:  In process, will continue to follow  Medicare Important Message Given:    Date Medicare IM Given:    Medicare IM give by:    Date Additional Medicare IM Given:    Additional Medicare Important Message give by:     If discussed at Long Length of Stay Meetings, dates discussed:    Additional Comments:  Lanier Clam, RN 06/13/2015, 2:35 PM

## 2015-06-13 NOTE — Care Management Note (Signed)
Case Management Note  Patient Details  Name: Alina Gilkey MRN: [id number] Date of Birth: Dec 19, 1945  Subjective/Objective:Transfer from SDU. PT-recc HHPT. Provided w/HHC agency list-patient chose [facility]-rep Clydie Braun aware-await HHPT order,[id number] notified.                    Action/Plan:d/c home w/HHPT.   Expected Discharge Date:                  Expected Discharge Plan:  Home w Home Health Services  In-House Referral:  NA  Discharge planning Services  CM Consult  Post Acute Care Choice:  NA Choice offered to:  Patient  DME Arranged:    DME Agency:     HH Arranged:  PT HH Agency:  [facility]  Status of Service:  In process, will continue to follow  Medicare Important Message Given:    Date Medicare IM Given:    Medicare IM give by:    Date Additional Medicare IM Given:    Additional Medicare Important Message give by:     If discussed at Long Length of Stay Meetings, dates discussed:    Additional Comments:  Lanier Clam, RN 06/13/2015, 1:24 PM

## 2015-06-13 NOTE — Progress Notes (Signed)
PROGRESS NOTE    Cheyenne Gould  YJE:[id number] DOB: 1945-07-08 DOA: 06/07/2015 PCP: Geraldo Pitter, MD Outpatient Specialists:   Brief Narrative: 70/F with history of hypertension, rheumatoid arthritis on remicaide, chronic anemia, GERD admitted with Sepsis, Hypotension, Pneumonia and AKI. 4/19 increasing O2 demand. 4/21: Abx changed to cefepime, due to worsening infiltrates 4/22: 1 unit PRBC and lasix 4/24: Blood  Assessment & Plan:     Sepsis with hypotension due to pneumonia -s/p 4days of-Vanc, levaquin changed to cefepime 4/21 due to worsening infiltrates -repeat CXR  4/21 with increased RUL pneumonia,  -stable now, Blood Cx negative -Stopped Vanc 4/22, O2 needs improving -changed to Po Cefpodoxime today for 1 more day -iatrogenic fluid overload from IVF and Blood- given lasix x1 4/23 -ambulate, PT eval completed -home tomorrow if stable   AKi /ATN due to sepsis -due to sepsis/hypotension and concomitant ARB/HCTZ use -baseline creatinine was 1.0 in 11/15 -improving, creatinine down to 2.2 from 3.4 on admission -urine output good, creatinine down to 1.4 now  Anemia  -due to chronic disease, remicaide, iron defi and dilution -monitor, s/p 1 unit PRBC 4/22 -will give another unit of blood today with lasix   RA -on remicaide infusions per Dr/Beekman, last in April  HTN -BP improved -stop IVF, holding all BP meds -stable without meds  Hyperglycemia -Hbaic 5.8  DVT prophylaxis: lovenox    Code Status: Full Code Family Communication: No family at bedside Disposition Plan:  Home tomorrow if stable  Consultants:      Procedures:   Antimicrobials: Vanc/levaquin 4/18  Subjective: Feels better, slept well  Objective: Filed Vitals:   06/12/15 1303 06/12/15 1620 06/12/15 2203 06/13/15 0537  BP: 128/71  121/70 111/63  Pulse: 90  86 81  Temp: 97.9 F (36.6 C)  98.1 F (36.7 C) 98.7 F (37.1 C)  TempSrc: Oral  Oral Oral  Resp: 20  18 18   Height:        Weight:      SpO2: 98% 94% 96% 97%    Intake/Output Summary (Last 24 hours) at 06/13/15 1107 Last data filed at 06/13/15 1038  Gross per 24 hour  Intake    170 ml  Output    354 ml  Net   -184 ml   Filed Weights   06/07/15 2144  Weight: 68.04 kg (150 lb)    Examination:  General exam: Appears calm and comfortable, no distress Respiratory system: ronchi and crackles in both lower lungs Cardiovascular system: S1 & S2 heard, RRR. No JVD, murmurs, rubs, gallops or clicks Gastrointestinal system: Abdomen is nondistended, soft and nontender. No organomegaly or masses felt. Normal bowel sounds  Central nervous system: Alert and oriented. No focal neurological deficits. Extremities: Symmetric 5 x 5 power. Skin: No rashes, lesions or ulcers Psychiatry: Judgement and insight appear normal. Mood & affect appropriate.     Data Reviewed: I have personally reviewed following labs and imaging studies  CBC:  Recent Labs Lab 06/07/15 2133 06/08/15 0143 06/09/15 0314 06/10/15 0311 06/11/15 0327 06/12/15 0530 06/13/15 0522  WBC 19.5* 19.1* 12.8* 13.5* 15.5* 16.1* 14.8*  NEUTROABS 17.8* 17.0* 11.0* 10.9*  --   --   --   HGB 9.0* 8.3* 7.6* 7.5* 7.2* 8.0* 7.7*  HCT 26.6* 24.3* 22.6* 21.8* 20.6* 22.9* 22.2*  MCV 79.6 79.7 77.4* 76.8* 75.7* 75.8* 76.3*  PLT 315 294 311 344 378 416* 433*   Basic Metabolic Panel:  Recent Labs Lab 06/08/15 0143 06/09/15 0314 06/10/15 0311 06/11/15 0327 06/12/15 0530  06/13/15 0522  NA 132* 135 136 139 137 140  K 4.9 4.9 5.1 4.5 4.7 3.9  CL 104 108 109 110 108 109  CO2 19* 16* 17* 21* 20* 22  GLUCOSE 127* 90 97 117* 108* 97  BUN 46* 49* 38* 34* 29* 26*  CREATININE 3.05* 2.93* 2.25* 1.89* 1.45* 1.29*  CALCIUM 7.2* 7.8* 8.2* 8.4* 8.4* 8.5*  MG 1.2*  --   --   --   --   --   PHOS 3.1  --   --   --   --   --    GFR: Estimated Creatinine Clearance: 39.1 mL/min (by C-G formula based on Cr of 1.29). Liver Function Tests:  Recent Labs Lab  06/07/15 2217 06/08/15 0143 06/09/15 0314 06/10/15 0311  AST 71* 66* 51* 53*  ALT 28 27 24 24   ALKPHOS 70 64 78 105  BILITOT 1.7* 1.3* 1.6* 1.4*  PROT 6.6 6.1* 6.1* 6.2*  ALBUMIN 2.7* 2.4* 2.3* 2.2*   No results for input(s): LIPASE, AMYLASE in the last 168 hours. No results for input(s): AMMONIA in the last 168 hours. Coagulation Profile: No results for input(s): INR, PROTIME in the last 168 hours. Cardiac Enzymes: No results for input(s): CKTOTAL, CKMB, CKMBINDEX, TROPONINI in the last 168 hours. BNP (last 3 results) No results for input(s): PROBNP in the last 8760 hours. HbA1C: No results for input(s): HGBA1C in the last 72 hours. CBG: No results for input(s): GLUCAP in the last 168 hours. Lipid Profile: No results for input(s): CHOL, HDL, LDLCALC, TRIG, CHOLHDL, LDLDIRECT in the last 72 hours. Thyroid Function Tests: No results for input(s): TSH, T4TOTAL, FREET4, T3FREE, THYROIDAB in the last 72 hours. Anemia Panel: No results for input(s): VITAMINB12, FOLATE, FERRITIN, TIBC, IRON, RETICCTPCT in the last 72 hours. Urine analysis:    Component Value Date/Time   COLORURINE AMBER* 06/08/2015 0935   APPEARANCEUR TURBID* 06/08/2015 0935   LABSPEC 1.019 06/08/2015 0935   PHURINE 5.0 06/08/2015 0935   GLUCOSEU NEGATIVE 06/08/2015 0935   HGBUR SMALL* 06/08/2015 0935   BILIRUBINUR SMALL* 06/08/2015 0935   KETONESUR NEGATIVE 06/08/2015 0935   PROTEINUR 30* 06/08/2015 0935   NITRITE NEGATIVE 06/08/2015 0935   LEUKOCYTESUR SMALL* 06/08/2015 0935   Sepsis Labs: @LABRCNTIP (procalcitonin:4,lacticidven:4)  ) Recent Results (from the past 240 hour(s))  Blood Culture (routine x 2)     Status: None (Preliminary result)   Collection Time: 06/07/15  9:35 PM  Result Value Ref Range Status   Specimen Description BLOOD RIGHT ANTECUBITAL  Final   Special Requests BOTTLES DRAWN AEROBIC ONLY 9 ML  Final   Culture   Final    NO GROWTH 4 DAYS Performed at [facility]     Report Status PENDING  Incomplete  Blood Culture (routine x 2)     Status: None (Preliminary result)   Collection Time: 06/07/15  9:37 PM  Result Value Ref Range Status   Specimen Description BLOOD LEFT HAND  Final   Special Requests IN PEDIATRIC BOTTLE 2 ML  Final   Culture   Final    NO GROWTH 4 DAYS Performed at [facility]    Report Status PENDING  Incomplete  MRSA PCR Screening     Status: Abnormal   Collection Time: 06/08/15 12:41 AM  Result Value Ref Range Status   MRSA by PCR POSITIVE (A) NEGATIVE Final    Comment:        The GeneXpert MRSA Assay (FDA approved for NASAL specimens only), is  one component of a comprehensive MRSA colonization surveillance program. It is not intended to diagnose MRSA infection nor to guide or monitor treatment for MRSA infections. RESULT CALLED TO, READ BACK BY AND VERIFIED WITH: A KROLICZAK RN @ 0410 ON 06/08/15 BY C DAVIS   Urine culture     Status: None   Collection Time: 06/08/15  9:35 AM  Result Value Ref Range Status   Specimen Description URINE, CATHETERIZED  Final   Special Requests NONE  Final   Culture   Final    NO GROWTH 1 DAY Performed at [facility]    Report Status 06/09/2015 FINAL  Final         Radiology Studies: No results found.      Scheduled Meds: . antiseptic oral rinse  7 mL Mouth Rinse BID  . cefpodoxime  200 mg Oral Q12H  . enoxaparin (LOVENOX) injection  40 mg Subcutaneous Q24H  . folic acid  1 mg Oral Daily  . furosemide  20 mg Intravenous Once  . loratadine  5 mg Oral QHS  . valACYclovir  500 mg Oral Daily   Continuous Infusions:     LOS: 6 days    Time spent:    Zannie Cove, MD [facility] Pager [phone]  If 7PM-7AM, please contact night-coverage [email or web address] Password [facility] 06/13/2015, 11:07 AM

## 2015-06-13 NOTE — Progress Notes (Addendum)
Pharmacy Antibiotic Note  Cheyenne Gould is a 70 y.o. female admitted on 06/07/2015 with sepsis due to pneumonia. Patient's antibiotics changed from levaquin to cefepime on 4/21 when CXR showed worsening infiltrates.  Dr.Joseph is aware of PCN allergy.  AKI resolving.    Plan: Cefepime d/c'd.  MD changed abx to Vantin.  Pharmacy will d/c protocol.  Height: 5\' 6"  (167.6 cm) Weight: 150 lb (68.04 kg) IBW/kg (Calculated) : 59.3  Temp (24hrs), Avg:98.3 F (36.8 C), Min:97.9 F (36.6 C), Max:98.7 F (37.1 C)   Recent Labs Lab 06/07/15 2132  06/08/15 0143 06/09/15 0314 06/10/15 0311 06/11/15 0327 06/12/15 0530 06/13/15 0522  WBC  --   < > 19.1* 12.8* 13.5* 15.5* 16.1* 14.8*  CREATININE  --   < > 3.05* 2.93* 2.25* 1.89* 1.45* 1.29*  LATICACIDVEN 3.07*  --  1.5  --   --   --   --   --   < > = values in this interval not displayed.  Estimated Creatinine Clearance: 39.1 mL/min (by C-G formula based on Cr of 1.29).    Allergies  Allergen Reactions  . Peanut-Containing Drug Products Anaphylaxis  . Shellfish Allergy Anaphylaxis  . Lyrica [Pregabalin] Other (See Comments)    Causes rheumatoid flares  . Penicillins Other (See Comments)    Corners of mouth started splitting  . Sulfa Antibiotics Other (See Comments)    unknown  . Leflunomide Rash   Antimicrobials this admission: 4/18 levaquin >> 4/21 4/18 vancomycin >> 4/22 4/21 cefepime>> 4/24 4/24 Vantin >>  Dose adjustments this admission:   Microbiology results: 4/18 BCx x2:  NGTD 4/19 UCx: NGF 4/19 MRSA PCR: positive on chlx + bactroban   Thank you for allowing pharmacy to be a part of this patient's care.  5/19, PharmD, BCPS Pager: [phone] 06/13/2015 12:39 PM

## 2015-06-14 DIAGNOSIS — R0902 Hypoxemia: Secondary | ICD-10-CM

## 2015-06-14 DIAGNOSIS — I959 Hypotension, unspecified: Secondary | ICD-10-CM

## 2015-06-14 LAB — CBC
HCT: 25.1 % — ABNORMAL LOW (ref 36.0–46.0)
Hemoglobin: 8.6 g/dL — ABNORMAL LOW (ref 12.0–15.0)
MCH: 26.7 pg (ref 26.0–34.0)
MCHC: 34.3 g/dL (ref 30.0–36.0)
MCV: 78 fL (ref 78.0–100.0)
Platelets: 487 10*3/uL — ABNORMAL HIGH (ref 150–400)
RBC: 3.22 MIL/uL — ABNORMAL LOW (ref 3.87–5.11)
RDW: 15.9 % — ABNORMAL HIGH (ref 11.5–15.5)
WBC: 16.3 10*3/uL — ABNORMAL HIGH (ref 4.0–10.5)

## 2015-06-14 LAB — BASIC METABOLIC PANEL
Anion gap: 8 (ref 5–15)
BUN: 23 mg/dL — ABNORMAL HIGH (ref 6–20)
CO2: 24 mmol/L (ref 22–32)
Calcium: 8.8 mg/dL — ABNORMAL LOW (ref 8.9–10.3)
Chloride: 111 mmol/L (ref 101–111)
Creatinine, Ser: 1.1 mg/dL — ABNORMAL HIGH (ref 0.44–1.00)
GFR calc Af Amer: 58 mL/min — ABNORMAL LOW (ref >60)
GFR calc non Af Amer: 50 mL/min — ABNORMAL LOW (ref >60)
Glucose, Bld: 107 mg/dL — ABNORMAL HIGH (ref 65–99)
Potassium: 3.8 mmol/L (ref 3.5–5.1)
Sodium: 143 mmol/L (ref 135–145)

## 2015-06-14 LAB — TYPE AND SCREEN
ABO/RH(D): O POS
Antibody Screen: NEGATIVE
Unit division: 0

## 2015-06-14 MED ORDER — AMLODIPINE BESYLATE 5 MG PO TABS: 5.0000 mg | ORAL_TABLET | Freq: Every day | ORAL | Status: DC

## 2015-06-14 MED ORDER — CEFPODOXIME PROXETIL 200 MG PO TABS: 200.0000 mg | ORAL_TABLET | Freq: Two times a day (BID) | ORAL | Status: DC

## 2015-06-14 NOTE — Discharge Summary (Signed)
Physician Discharge Summary  Cheyenne Gould APO:[id number] DOB: 1946-01-05 DOA: 06/07/2015  PCP: Geraldo Pitter, MD  Admit date: 06/07/2015 Discharge date: 06/14/2015  Time spent: 45 minutes  Recommendations for Outpatient Follow-up:  1. PCP Dr.Bland in 1 week, needs FU CXR in 4-6weeks 2. BP meds titrated down since BP low but improving this admission, resume HCTZ as BP tolerates upon FU   Discharge Diagnoses:    Severe Sepsis    Multifocal pneumonia   Hypertension   Rheumatoid arthritis (HCC)   Hyperlipidemia   Anemia   AKI (acute kidney injury) (HCC)   Sepsis due to pneumonia (HCC)   Hypotension   Hypoxia   Discharge Condition: stable  Diet recommendation: heart healthy  Filed Weights   06/07/15 2144  Weight: 68.04 kg (150 lb)    History of present illness:  HPI: Cheyenne Gould is a 70 y.o. female with a past medical history of hypertension, rheumatoid arthritis, hyperlipidemia, seasonal allergies, chronic anemia, GERD who comes referred from urgent care due to hypotension and abdominal pain. Per patient, she woke up on Thursday and since then she has been feeling very tired, fatigued, without appetite and sleeping a lot more than usual. She presented with 2 episodes of emesis, vomiting . Her temperature in the emergency room was 102.17F,  blood pressure was 73/32 mmHg and heart rate 116 bpm. She complains of pleuritic chest pain and upper abdomen pain with coughing. Workup in the emergency department shows leukocytosis, worsening anemia, elevated BUN and creatinine and bilateral lower lobe consolidation on chest radiograph, right greater than left.  Hospital Course:  Severe Sepsis with hypotension due to multifocal pneumonia -Treated with-Vanc/ levaquin initially, then changed to IV cefepime on 4/21 due to worsening infiltrates -slowly improved since then -All cultures negative, Abx changed to Po Cefpodoxime for days to complete 10day course -had some iatrogenic fluid  overload from IVF and Blood- given lasix x1 4/23 -ambulated, PT eval completed -discharged home in stable condition with [facility]  AKi /ATN due to sepsis -due to sepsis/hypotension and concomitant ARB/HCTZ use -baseline creatinine was 1.0 in 11/15 -improving, creatinine down to 1.2 from 3.4 on admission  Anemia  -due to chronic disease, remicaide, iron defi and dilution - s/p 2 units PRBC this admission -improved and stable since then  RA -on remicaide infusions per Dr.Beekman, last in April  HTN -BP improved -stop IVF, holding all BP meds -stable without meds  Hyperglycemia -Hbaic 5.8  Discharge Exam: Filed Vitals:   06/13/15 2104 06/14/15 0524  BP: 121/69 129/67  Pulse: 84 78  Temp: 99.1 F (37.3 C) 98.4 F (36.9 C)  Resp: 18 18    General: AAOx3 Cardiovascular: S1S2/RRR Respiratory: CTAB  Discharge Instructions   Discharge Instructions    Diet - low sodium heart healthy    Complete by:  As directed      Increase activity slowly    Complete by:  As directed           Discharge Medication List as of 06/14/2015 11:48 AM    START taking these medications   Details  amLODipine (NORVASC) 5 MG tablet Take 1 tablet (5 mg total) by mouth daily., Starting 06/14/2015, Until Discontinued, Print    cefpodoxime (VANTIN) 200 MG tablet Take 1 tablet (200 mg total) by mouth every 12 (twelve) hours. For 2days, Starting 06/14/2015, Until Discontinued, Print      CONTINUE these medications which have NOT CHANGED   Details  acitretin (SORIATANE) 25 MG capsule Take 25  mg by mouth daily., Starting 05/29/2015, Until Discontinued, Historical Med    calcipotriene (DOVONOX) 0.005 % ointment Apply 1 application topically 2 (two) times daily. , Starting 06/18/2014, Until Discontinued, Historical Med    folic acid (FOLVITE) 1 MG tablet 2 TABLET BY MOUTH ONCE DAILY, Historical Med    HYDROcodone-acetaminophen (NORCO/VICODIN) 5-325 MG per tablet Take 1 tablet by mouth every 6 (six)  hours as needed for moderate pain. , Starting 08/05/2012, Until Discontinued, Historical Med    levocetirizine (XYZAL) 5 MG tablet Take 5 mg by mouth at bedtime. , Starting 05/21/2012, Until Discontinued, Historical Med    valACYclovir (VALTREX) 500 MG tablet Take 500 mg by mouth daily., Starting 05/26/2015, Until Discontinued, Historical Med    VITAMIN D, ERGOCALCIFEROL, PO Take 1 capsule by mouth once a week. OVER THE COUNTER DOSAGE UNKNOWN, ON FRIDAYS, Until Discontinued, Historical Med    acetaminophen (TYLENOL) 500 MG tablet Take 500 mg by mouth every 6 (six) hours as needed for mild pain or headache. , Until Discontinued, Historical Med    clindamycin (CLEOCIN) 300 MG capsule Take 600 mg by mouth. Two hours prior to dental work., Until Discontinued, Historical Med    clobetasol ointment (TEMOVATE) 0.05 % Apply 1 application topically 2 (two) times daily. , Starting 06/17/2014, Until Discontinued, Historical Med    EPIPEN 2-PAK 0.3 MG/0.3ML SOAJ injection Inject 0.3 mg into the muscle as needed (for allergic reactions). , Starting 06/14/2014, Until Discontinued, Historical Med    fluticasone (FLONASE) 50 MCG/ACT nasal spray Place 2 sprays into both nostrils daily as needed for allergies. , Starting 04/19/2012, Until Discontinued, Historical Med    Menthol, Topical Analgesic, (BIOFREEZE EX) Apply 1 application topically daily as needed (knee pain.)., Until Discontinued, Historical Med      STOP taking these medications     Amlodipine-Valsartan-HCTZ 5-160-12.5 MG TABS      oxyCODONE-acetaminophen (PERCOCET) 5-325 MG per tablet        Allergies  Allergen Reactions  . Peanut-Containing Drug Products Anaphylaxis  . Shellfish Allergy Anaphylaxis  . Lyrica [Pregabalin] Other (See Comments)    Causes rheumatoid flares  . Penicillins Other (See Comments)    Corners of mouth started splitting  . Sulfa Antibiotics Other (See Comments)    unknown  . Leflunomide Rash   Follow-up Information     Follow up with [facility].   Why:  HHPT   Contact information:   [address] [place] [address] [phone]       Follow up with Geraldo Pitter, MD In 1 week.   Specialty:  Family Medicine   Contact information:   [address] [place] [address] [phone]        The results of significant diagnostics from this hospitalization (including imaging, microbiology, ancillary and laboratory) are listed below for reference.    Significant Diagnostic Studies: Dg Chest 2 View  06/10/2015  CLINICAL DATA:  Hypoxia, hypertension, former smoker EXAM: CHEST  2 VIEW COMPARISON:  06/08/2015 FINDINGS: Stable heart size and mediastinal contours. Consolidation identified in RIGHT upper lobe and RIGHT lower lobe with suspected basilar pleural effusion. Minimal infiltrate developing in LEFT upper lobe as well. No pneumothorax. Bones unremarkable. IMPRESSION: BILATERAL pulmonary infiltrates increased in RIGHT upper lobe and new in LEFT upper lobe since previous exam. Electronically Signed   By: Ulyses Southward M.D.   On: 06/10/2015 12:41   US Renal  06/09/2015  CLINICAL DATA:  Acute kidney injury.  Inpatient. EXAM: RENAL /  URINARY TRACT ULTRASOUND COMPLETE COMPARISON:  08/20/2003 CT abdomen/pelvis. FINDINGS: Right Kidney: Length: 9.5 cm. Mildly echogenic right kidney. No right hydronephrosis. No right renal mass. Left Kidney: Length: 11.2 cm. Mildly echogenic left kidney. No left hydronephrosis. No left renal mass. Bladder: Bladder is completely collapsed by indwelling Foley catheter and cannot be evaluated. IMPRESSION: 1. No hydronephrosis. 2. Mildly echogenic kidneys, a nonspecific finding indicating renal parenchymal disease of uncertain chronicity. No renal atrophy. 3. Bladder is completely collapsed by indwelling Foley catheter and cannot be evaluated on this scan. Electronically Signed   By: Delbert Phenix M.D.   On: 06/09/2015 08:25   Dg Chest Port 1  View  06/08/2015  CLINICAL DATA:  Hypoxia. EXAM: PORTABLE CHEST 1 VIEW COMPARISON:  June 07, 2015. FINDINGS: Stable cardiomediastinal silhouette. No pneumothorax is noted. Stable mild left basilar subsegmental atelectasis is noted. Increased right upper lobe airspace opacity is noted concerning for worsening pneumonia. Mild right lower lobe atelectasis or pneumonia is noted as well. IMPRESSION: Stable mild left basilar subsegmental atelectasis. Increased right upper lobe pneumonia is noted. Electronically Signed   By: Lupita Raider, M.D.   On: 06/08/2015 09:07   Dg Chest Port 1 View  06/07/2015  CLINICAL DATA:  Abdominal pain, nausea and vomiting. Hypotension. Low grade fever. Tachycardia. EXAM: PORTABLE CHEST 1 VIEW COMPARISON:  01/01/2014 FINDINGS: Shallow inspiration. Infiltration or atelectasis in both lung bases, greater on the right. Changes may indicate pneumonia. Normal heart size and pulmonary vascularity. No blunting of costophrenic angles. No pneumothorax. Degenerative changes in the shoulders. IMPRESSION: Atelectasis or consolidation in both lung bases, greater on the right. Possible pneumonia. Followup PA and lateral chest X-ray is recommended in 3-4 weeks following trial of antibiotic therapy to ensure resolution and exclude underlying malignancy. Electronically Signed   By: Burman Nieves M.D.   On: 06/07/2015 21:53    Microbiology: Recent Results (from the past 240 hour(s))  Blood Culture (routine x 2)     Status: None   Collection Time: 06/07/15  9:35 PM  Result Value Ref Range Status   Specimen Description BLOOD RIGHT ANTECUBITAL  Final   Special Requests BOTTLES DRAWN AEROBIC ONLY 9 ML  Final   Culture   Final    NO GROWTH 5 DAYS Performed at [facility]    Report Status 06/13/2015 FINAL  Final  Blood Culture (routine x 2)     Status: None   Collection Time: 06/07/15  9:37 PM  Result Value Ref Range Status   Specimen Description BLOOD LEFT HAND  Final   Special  Requests IN PEDIATRIC BOTTLE 2 ML  Final   Culture   Final    NO GROWTH 5 DAYS Performed at [facility]    Report Status 06/13/2015 FINAL  Final  MRSA PCR Screening     Status: Abnormal   Collection Time: 06/08/15 12:41 AM  Result Value Ref Range Status   MRSA by PCR POSITIVE (A) NEGATIVE Final    Comment:        The GeneXpert MRSA Assay (FDA approved for NASAL specimens only), is one component of a comprehensive MRSA colonization surveillance program. It is not intended to diagnose MRSA infection nor to guide or monitor treatment for MRSA infections. RESULT CALLED TO, READ BACK BY AND VERIFIED WITH: A KROLICZAK RN @ 0410 ON 06/08/15 BY C DAVIS   Urine culture     Status: None   Collection Time: 06/08/15  9:35 AM  Result Value Ref Range Status  Specimen Description URINE, CATHETERIZED  Final   Special Requests NONE  Final   Culture   Final    NO GROWTH 1 DAY Performed at [facility]    Report Status 06/09/2015 FINAL  Final     Labs: Basic Metabolic Panel:  Recent Labs Lab 06/08/15 0143  06/10/15 0311 06/11/15 0327 06/12/15 0530 06/13/15 0522 06/14/15 0538  NA 132*  < > 136 139 137 140 143  K 4.9  < > 5.1 4.5 4.7 3.9 3.8  CL 104  < > 109 110 108 109 111  CO2 19*  < > 17* 21* 20* 22 24  GLUCOSE 127*  < > 97 117* 108* 97 107*  BUN 46*  < > 38* 34* 29* 26* 23*  CREATININE 3.05*  < > 2.25* 1.89* 1.45* 1.29* 1.10*  CALCIUM 7.2*  < > 8.2* 8.4* 8.4* 8.5* 8.8*  MG 1.2*  --   --   --   --   --   --   PHOS 3.1  --   --   --   --   --   --   < > = values in this interval not displayed. Liver Function Tests:  Recent Labs Lab 06/07/15 2217 06/08/15 0143 06/09/15 0314 06/10/15 0311  AST 71* 66* 51* 53*  ALT ALKPHOS 70 64 78 105  BILITOT 1.7* 1.3* 1.6* 1.4*  PROT 6.6 6.1* 6.1* 6.2*  ALBUMIN 2.7* 2.4* 2.3* 2.2*   No results for input(s): LIPASE, AMYLASE in the last 168 hours. No results for input(s): AMMONIA in the last 168  hours. CBC:  Recent Labs Lab 06/07/15 2133 06/08/15 0143 06/09/15 0314 06/10/15 0311 06/11/15 0327 06/12/15 0530 06/13/15 0522 06/14/15 0538  WBC 19.5* 19.1* 12.8* 13.5* 15.5* 16.1* 14.8* 16.3*  NEUTROABS 17.8* 17.0* 11.0* 10.9*  --   --   --   --   HGB 9.0* 8.3* 7.6* 7.5* 7.2* 8.0* 7.7* 8.6*  HCT 26.6* 24.3* 22.6* 21.8* 20.6* 22.9* 22.2* 25.1*  MCV 79.6 79.7 77.4* 76.8* 75.7* 75.8* 76.3* 78.0  PLT 315 294 311 344 378 416* 433* 487*   Cardiac Enzymes: No results for input(s): CKTOTAL, CKMB, CKMBINDEX, TROPONINI in the last 168 hours. BNP: BNP (last 3 results) No results for input(s): BNP in the last 8760 hours.  ProBNP (last 3 results) No results for input(s): PROBNP in the last 8760 hours.  CBG: No results for input(s): GLUCAP in the last 168 hours.     SignedZannie Cove MD.  [facility] 06/14/2015, 4:07 PM

## 2015-06-16 DIAGNOSIS — I959 Hypotension, unspecified: Secondary | ICD-10-CM | POA: Diagnosis not present

## 2015-06-16 DIAGNOSIS — I10 Essential (primary) hypertension: Secondary | ICD-10-CM | POA: Diagnosis not present

## 2015-06-16 DIAGNOSIS — D649 Anemia, unspecified: Secondary | ICD-10-CM | POA: Diagnosis not present

## 2015-06-16 DIAGNOSIS — J189 Pneumonia, unspecified organism: Secondary | ICD-10-CM | POA: Diagnosis not present

## 2015-06-16 DIAGNOSIS — M069 Rheumatoid arthritis, unspecified: Secondary | ICD-10-CM | POA: Diagnosis not present

## 2015-06-16 DIAGNOSIS — K219 Gastro-esophageal reflux disease without esophagitis: Secondary | ICD-10-CM | POA: Diagnosis not present

## 2015-06-21 DIAGNOSIS — J159 Unspecified bacterial pneumonia: Secondary | ICD-10-CM | POA: Diagnosis not present

## 2015-06-21 DIAGNOSIS — K219 Gastro-esophageal reflux disease without esophagitis: Secondary | ICD-10-CM | POA: Diagnosis not present

## 2015-06-21 DIAGNOSIS — J189 Pneumonia, unspecified organism: Secondary | ICD-10-CM | POA: Diagnosis not present

## 2015-06-21 DIAGNOSIS — Z6827 Body mass index (BMI) 27.0-27.9, adult: Secondary | ICD-10-CM | POA: Diagnosis not present

## 2015-06-21 DIAGNOSIS — M069 Rheumatoid arthritis, unspecified: Secondary | ICD-10-CM | POA: Diagnosis not present

## 2015-06-21 DIAGNOSIS — D649 Anemia, unspecified: Secondary | ICD-10-CM | POA: Diagnosis not present

## 2015-06-21 DIAGNOSIS — I10 Essential (primary) hypertension: Secondary | ICD-10-CM | POA: Diagnosis not present

## 2015-06-21 DIAGNOSIS — I959 Hypotension, unspecified: Secondary | ICD-10-CM | POA: Diagnosis not present

## 2015-06-23 DIAGNOSIS — J189 Pneumonia, unspecified organism: Secondary | ICD-10-CM | POA: Diagnosis not present

## 2015-06-23 DIAGNOSIS — I959 Hypotension, unspecified: Secondary | ICD-10-CM | POA: Diagnosis not present

## 2015-06-23 DIAGNOSIS — D649 Anemia, unspecified: Secondary | ICD-10-CM | POA: Diagnosis not present

## 2015-06-23 DIAGNOSIS — I10 Essential (primary) hypertension: Secondary | ICD-10-CM | POA: Diagnosis not present

## 2015-06-23 DIAGNOSIS — K219 Gastro-esophageal reflux disease without esophagitis: Secondary | ICD-10-CM | POA: Diagnosis not present

## 2015-06-23 DIAGNOSIS — M069 Rheumatoid arthritis, unspecified: Secondary | ICD-10-CM | POA: Diagnosis not present

## 2015-06-27 DIAGNOSIS — Z79899 Other long term (current) drug therapy: Secondary | ICD-10-CM | POA: Diagnosis not present

## 2015-06-27 DIAGNOSIS — K130 Diseases of lips: Secondary | ICD-10-CM | POA: Diagnosis not present

## 2015-06-27 DIAGNOSIS — L400 Psoriasis vulgaris: Secondary | ICD-10-CM | POA: Diagnosis not present

## 2015-06-28 DIAGNOSIS — I959 Hypotension, unspecified: Secondary | ICD-10-CM | POA: Diagnosis not present

## 2015-06-28 DIAGNOSIS — K219 Gastro-esophageal reflux disease without esophagitis: Secondary | ICD-10-CM | POA: Diagnosis not present

## 2015-06-28 DIAGNOSIS — D649 Anemia, unspecified: Secondary | ICD-10-CM | POA: Diagnosis not present

## 2015-06-28 DIAGNOSIS — J189 Pneumonia, unspecified organism: Secondary | ICD-10-CM | POA: Diagnosis not present

## 2015-06-28 DIAGNOSIS — I10 Essential (primary) hypertension: Secondary | ICD-10-CM | POA: Diagnosis not present

## 2015-06-28 DIAGNOSIS — M069 Rheumatoid arthritis, unspecified: Secondary | ICD-10-CM | POA: Diagnosis not present

## 2015-06-29 DIAGNOSIS — R21 Rash and other nonspecific skin eruption: Secondary | ICD-10-CM | POA: Diagnosis not present

## 2015-06-29 DIAGNOSIS — M150 Primary generalized (osteo)arthritis: Secondary | ICD-10-CM | POA: Diagnosis not present

## 2015-06-29 DIAGNOSIS — M5136 Other intervertebral disc degeneration, lumbar region: Secondary | ICD-10-CM | POA: Diagnosis not present

## 2015-06-29 DIAGNOSIS — M0589 Other rheumatoid arthritis with rheumatoid factor of multiple sites: Secondary | ICD-10-CM | POA: Diagnosis not present

## 2015-07-05 ENCOUNTER — Other Ambulatory Visit: Payer: Self-pay | Admitting: Family Medicine

## 2015-07-05 ENCOUNTER — Ambulatory Visit
Admission: RE | Admit: 2015-07-05 | Discharge: 2015-07-05 | Disposition: A | Discharge status: Home / Self Care | Payer: Federal, State, Local not specified - PPO | Source: Ambulatory Visit | Attending: Family Medicine | Admitting: Family Medicine

## 2015-07-05 DIAGNOSIS — J441 Chronic obstructive pulmonary disease with (acute) exacerbation: Secondary | ICD-10-CM

## 2015-07-05 DIAGNOSIS — J159 Unspecified bacterial pneumonia: Secondary | ICD-10-CM | POA: Diagnosis not present

## 2015-07-05 DIAGNOSIS — R634 Abnormal weight loss: Secondary | ICD-10-CM | POA: Diagnosis not present

## 2015-07-05 DIAGNOSIS — J189 Pneumonia, unspecified organism: Secondary | ICD-10-CM | POA: Diagnosis not present

## 2015-07-25 DIAGNOSIS — Z6825 Body mass index (BMI) 25.0-25.9, adult: Secondary | ICD-10-CM | POA: Diagnosis not present

## 2015-07-25 DIAGNOSIS — R634 Abnormal weight loss: Secondary | ICD-10-CM | POA: Diagnosis not present

## 2015-08-02 DIAGNOSIS — L400 Psoriasis vulgaris: Secondary | ICD-10-CM | POA: Diagnosis not present

## 2015-08-02 DIAGNOSIS — Z79899 Other long term (current) drug therapy: Secondary | ICD-10-CM | POA: Diagnosis not present

## 2015-08-03 DIAGNOSIS — K259 Gastric ulcer, unspecified as acute or chronic, without hemorrhage or perforation: Secondary | ICD-10-CM | POA: Diagnosis not present

## 2015-08-03 DIAGNOSIS — R1013 Epigastric pain: Secondary | ICD-10-CM | POA: Diagnosis not present

## 2015-08-03 DIAGNOSIS — K219 Gastro-esophageal reflux disease without esophagitis: Secondary | ICD-10-CM | POA: Diagnosis not present

## 2015-08-09 DIAGNOSIS — R1013 Epigastric pain: Secondary | ICD-10-CM | POA: Diagnosis not present

## 2015-08-09 DIAGNOSIS — K2971 Gastritis, unspecified, with bleeding: Secondary | ICD-10-CM | POA: Diagnosis not present

## 2015-08-09 DIAGNOSIS — K2970 Gastritis, unspecified, without bleeding: Secondary | ICD-10-CM | POA: Diagnosis not present

## 2015-08-09 DIAGNOSIS — K259 Gastric ulcer, unspecified as acute or chronic, without hemorrhage or perforation: Secondary | ICD-10-CM | POA: Diagnosis not present

## 2015-08-10 DIAGNOSIS — R21 Rash and other nonspecific skin eruption: Secondary | ICD-10-CM | POA: Diagnosis not present

## 2015-08-10 DIAGNOSIS — M150 Primary generalized (osteo)arthritis: Secondary | ICD-10-CM | POA: Diagnosis not present

## 2015-08-10 DIAGNOSIS — L401 Generalized pustular psoriasis: Secondary | ICD-10-CM | POA: Diagnosis not present

## 2015-08-10 DIAGNOSIS — M0589 Other rheumatoid arthritis with rheumatoid factor of multiple sites: Secondary | ICD-10-CM | POA: Diagnosis not present

## 2015-08-10 DIAGNOSIS — M5136 Other intervertebral disc degeneration, lumbar region: Secondary | ICD-10-CM | POA: Diagnosis not present

## 2015-08-24 DIAGNOSIS — I10 Essential (primary) hypertension: Secondary | ICD-10-CM | POA: Diagnosis not present

## 2015-08-24 DIAGNOSIS — M0549 Rheumatoid myopathy with rheumatoid arthritis of multiple sites: Secondary | ICD-10-CM | POA: Diagnosis not present

## 2015-08-24 DIAGNOSIS — J441 Chronic obstructive pulmonary disease with (acute) exacerbation: Secondary | ICD-10-CM | POA: Diagnosis not present

## 2015-09-29 DIAGNOSIS — J0101 Acute recurrent maxillary sinusitis: Secondary | ICD-10-CM | POA: Diagnosis not present

## 2015-10-12 DIAGNOSIS — L400 Psoriasis vulgaris: Secondary | ICD-10-CM | POA: Diagnosis not present

## 2015-10-13 DIAGNOSIS — M9903 Segmental and somatic dysfunction of lumbar region: Secondary | ICD-10-CM | POA: Diagnosis not present

## 2015-10-13 DIAGNOSIS — M542 Cervicalgia: Secondary | ICD-10-CM | POA: Diagnosis not present

## 2015-10-13 DIAGNOSIS — M9901 Segmental and somatic dysfunction of cervical region: Secondary | ICD-10-CM | POA: Diagnosis not present

## 2015-10-13 DIAGNOSIS — M545 Low back pain: Secondary | ICD-10-CM | POA: Diagnosis not present

## 2015-10-17 DIAGNOSIS — M9901 Segmental and somatic dysfunction of cervical region: Secondary | ICD-10-CM | POA: Diagnosis not present

## 2015-10-17 DIAGNOSIS — M545 Low back pain: Secondary | ICD-10-CM | POA: Diagnosis not present

## 2015-10-17 DIAGNOSIS — M9903 Segmental and somatic dysfunction of lumbar region: Secondary | ICD-10-CM | POA: Diagnosis not present

## 2015-10-17 DIAGNOSIS — M542 Cervicalgia: Secondary | ICD-10-CM | POA: Diagnosis not present

## 2015-10-19 DIAGNOSIS — M9901 Segmental and somatic dysfunction of cervical region: Secondary | ICD-10-CM | POA: Diagnosis not present

## 2015-10-19 DIAGNOSIS — M9903 Segmental and somatic dysfunction of lumbar region: Secondary | ICD-10-CM | POA: Diagnosis not present

## 2015-10-19 DIAGNOSIS — M545 Low back pain: Secondary | ICD-10-CM | POA: Diagnosis not present

## 2015-10-19 DIAGNOSIS — M542 Cervicalgia: Secondary | ICD-10-CM | POA: Diagnosis not present

## 2015-10-20 DIAGNOSIS — M9901 Segmental and somatic dysfunction of cervical region: Secondary | ICD-10-CM | POA: Diagnosis not present

## 2015-10-20 DIAGNOSIS — M9903 Segmental and somatic dysfunction of lumbar region: Secondary | ICD-10-CM | POA: Diagnosis not present

## 2015-10-20 DIAGNOSIS — M545 Low back pain: Secondary | ICD-10-CM | POA: Diagnosis not present

## 2015-10-20 DIAGNOSIS — M542 Cervicalgia: Secondary | ICD-10-CM | POA: Diagnosis not present

## 2015-10-25 DIAGNOSIS — M542 Cervicalgia: Secondary | ICD-10-CM | POA: Diagnosis not present

## 2015-10-25 DIAGNOSIS — M9903 Segmental and somatic dysfunction of lumbar region: Secondary | ICD-10-CM | POA: Diagnosis not present

## 2015-10-25 DIAGNOSIS — M545 Low back pain: Secondary | ICD-10-CM | POA: Diagnosis not present

## 2015-10-25 DIAGNOSIS — M9901 Segmental and somatic dysfunction of cervical region: Secondary | ICD-10-CM | POA: Diagnosis not present

## 2015-10-26 DIAGNOSIS — M545 Low back pain: Secondary | ICD-10-CM | POA: Diagnosis not present

## 2015-10-26 DIAGNOSIS — I10 Essential (primary) hypertension: Secondary | ICD-10-CM | POA: Diagnosis not present

## 2015-10-26 DIAGNOSIS — M9903 Segmental and somatic dysfunction of lumbar region: Secondary | ICD-10-CM | POA: Diagnosis not present

## 2015-10-26 DIAGNOSIS — M542 Cervicalgia: Secondary | ICD-10-CM | POA: Diagnosis not present

## 2015-10-26 DIAGNOSIS — M0549 Rheumatoid myopathy with rheumatoid arthritis of multiple sites: Secondary | ICD-10-CM | POA: Diagnosis not present

## 2015-10-26 DIAGNOSIS — M9901 Segmental and somatic dysfunction of cervical region: Secondary | ICD-10-CM | POA: Diagnosis not present

## 2015-10-26 DIAGNOSIS — J441 Chronic obstructive pulmonary disease with (acute) exacerbation: Secondary | ICD-10-CM | POA: Diagnosis not present

## 2015-10-27 DIAGNOSIS — M545 Low back pain: Secondary | ICD-10-CM | POA: Diagnosis not present

## 2015-10-27 DIAGNOSIS — M542 Cervicalgia: Secondary | ICD-10-CM | POA: Diagnosis not present

## 2015-10-27 DIAGNOSIS — M9901 Segmental and somatic dysfunction of cervical region: Secondary | ICD-10-CM | POA: Diagnosis not present

## 2015-10-27 DIAGNOSIS — M9903 Segmental and somatic dysfunction of lumbar region: Secondary | ICD-10-CM | POA: Diagnosis not present

## 2015-11-01 DIAGNOSIS — M545 Low back pain: Secondary | ICD-10-CM | POA: Diagnosis not present

## 2015-11-01 DIAGNOSIS — M9901 Segmental and somatic dysfunction of cervical region: Secondary | ICD-10-CM | POA: Diagnosis not present

## 2015-11-01 DIAGNOSIS — M9903 Segmental and somatic dysfunction of lumbar region: Secondary | ICD-10-CM | POA: Diagnosis not present

## 2015-11-01 DIAGNOSIS — M542 Cervicalgia: Secondary | ICD-10-CM | POA: Diagnosis not present

## 2015-11-02 DIAGNOSIS — Z79899 Other long term (current) drug therapy: Secondary | ICD-10-CM | POA: Diagnosis not present

## 2015-11-02 DIAGNOSIS — L400 Psoriasis vulgaris: Secondary | ICD-10-CM | POA: Diagnosis not present

## 2015-11-02 DIAGNOSIS — M542 Cervicalgia: Secondary | ICD-10-CM | POA: Diagnosis not present

## 2015-11-02 DIAGNOSIS — M9901 Segmental and somatic dysfunction of cervical region: Secondary | ICD-10-CM | POA: Diagnosis not present

## 2015-11-02 DIAGNOSIS — M545 Low back pain: Secondary | ICD-10-CM | POA: Diagnosis not present

## 2015-11-02 DIAGNOSIS — M9903 Segmental and somatic dysfunction of lumbar region: Secondary | ICD-10-CM | POA: Diagnosis not present

## 2015-11-03 DIAGNOSIS — M9901 Segmental and somatic dysfunction of cervical region: Secondary | ICD-10-CM | POA: Diagnosis not present

## 2015-11-03 DIAGNOSIS — M9903 Segmental and somatic dysfunction of lumbar region: Secondary | ICD-10-CM | POA: Diagnosis not present

## 2015-11-03 DIAGNOSIS — M545 Low back pain: Secondary | ICD-10-CM | POA: Diagnosis not present

## 2015-11-03 DIAGNOSIS — M542 Cervicalgia: Secondary | ICD-10-CM | POA: Diagnosis not present

## 2015-11-08 DIAGNOSIS — M9901 Segmental and somatic dysfunction of cervical region: Secondary | ICD-10-CM | POA: Diagnosis not present

## 2015-11-08 DIAGNOSIS — M545 Low back pain: Secondary | ICD-10-CM | POA: Diagnosis not present

## 2015-11-08 DIAGNOSIS — M9903 Segmental and somatic dysfunction of lumbar region: Secondary | ICD-10-CM | POA: Diagnosis not present

## 2015-11-08 DIAGNOSIS — M542 Cervicalgia: Secondary | ICD-10-CM | POA: Diagnosis not present

## 2015-11-09 DIAGNOSIS — M9901 Segmental and somatic dysfunction of cervical region: Secondary | ICD-10-CM | POA: Diagnosis not present

## 2015-11-09 DIAGNOSIS — M542 Cervicalgia: Secondary | ICD-10-CM | POA: Diagnosis not present

## 2015-11-09 DIAGNOSIS — M9903 Segmental and somatic dysfunction of lumbar region: Secondary | ICD-10-CM | POA: Diagnosis not present

## 2015-11-09 DIAGNOSIS — M545 Low back pain: Secondary | ICD-10-CM | POA: Diagnosis not present

## 2015-11-10 DIAGNOSIS — M9901 Segmental and somatic dysfunction of cervical region: Secondary | ICD-10-CM | POA: Diagnosis not present

## 2015-11-10 DIAGNOSIS — R21 Rash and other nonspecific skin eruption: Secondary | ICD-10-CM | POA: Diagnosis not present

## 2015-11-10 DIAGNOSIS — L4059 Other psoriatic arthropathy: Secondary | ICD-10-CM | POA: Diagnosis not present

## 2015-11-10 DIAGNOSIS — M5136 Other intervertebral disc degeneration, lumbar region: Secondary | ICD-10-CM | POA: Diagnosis not present

## 2015-11-10 DIAGNOSIS — M542 Cervicalgia: Secondary | ICD-10-CM | POA: Diagnosis not present

## 2015-11-10 DIAGNOSIS — M545 Low back pain: Secondary | ICD-10-CM | POA: Diagnosis not present

## 2015-11-10 DIAGNOSIS — L401 Generalized pustular psoriasis: Secondary | ICD-10-CM | POA: Diagnosis not present

## 2015-11-10 DIAGNOSIS — M9903 Segmental and somatic dysfunction of lumbar region: Secondary | ICD-10-CM | POA: Diagnosis not present

## 2015-11-10 DIAGNOSIS — M0589 Other rheumatoid arthritis with rheumatoid factor of multiple sites: Secondary | ICD-10-CM | POA: Diagnosis not present

## 2015-11-10 DIAGNOSIS — M150 Primary generalized (osteo)arthritis: Secondary | ICD-10-CM | POA: Diagnosis not present

## 2015-11-15 DIAGNOSIS — M9903 Segmental and somatic dysfunction of lumbar region: Secondary | ICD-10-CM | POA: Diagnosis not present

## 2015-11-15 DIAGNOSIS — M542 Cervicalgia: Secondary | ICD-10-CM | POA: Diagnosis not present

## 2015-11-15 DIAGNOSIS — M545 Low back pain: Secondary | ICD-10-CM | POA: Diagnosis not present

## 2015-11-15 DIAGNOSIS — M9901 Segmental and somatic dysfunction of cervical region: Secondary | ICD-10-CM | POA: Diagnosis not present

## 2015-11-16 DIAGNOSIS — L4059 Other psoriatic arthropathy: Secondary | ICD-10-CM | POA: Diagnosis not present

## 2015-11-19 DIAGNOSIS — M542 Cervicalgia: Secondary | ICD-10-CM | POA: Diagnosis not present

## 2015-11-19 DIAGNOSIS — M545 Low back pain: Secondary | ICD-10-CM | POA: Diagnosis not present

## 2015-11-19 DIAGNOSIS — M9903 Segmental and somatic dysfunction of lumbar region: Secondary | ICD-10-CM | POA: Diagnosis not present

## 2015-11-19 DIAGNOSIS — M9901 Segmental and somatic dysfunction of cervical region: Secondary | ICD-10-CM | POA: Diagnosis not present

## 2015-11-22 DIAGNOSIS — M9901 Segmental and somatic dysfunction of cervical region: Secondary | ICD-10-CM | POA: Diagnosis not present

## 2015-11-22 DIAGNOSIS — M545 Low back pain: Secondary | ICD-10-CM | POA: Diagnosis not present

## 2015-11-22 DIAGNOSIS — M542 Cervicalgia: Secondary | ICD-10-CM | POA: Diagnosis not present

## 2015-11-22 DIAGNOSIS — M9903 Segmental and somatic dysfunction of lumbar region: Secondary | ICD-10-CM | POA: Diagnosis not present

## 2015-11-23 DIAGNOSIS — M545 Low back pain: Secondary | ICD-10-CM | POA: Diagnosis not present

## 2015-11-23 DIAGNOSIS — M9903 Segmental and somatic dysfunction of lumbar region: Secondary | ICD-10-CM | POA: Diagnosis not present

## 2015-11-23 DIAGNOSIS — M9901 Segmental and somatic dysfunction of cervical region: Secondary | ICD-10-CM | POA: Diagnosis not present

## 2015-11-23 DIAGNOSIS — M542 Cervicalgia: Secondary | ICD-10-CM | POA: Diagnosis not present

## 2015-11-29 DIAGNOSIS — M542 Cervicalgia: Secondary | ICD-10-CM | POA: Diagnosis not present

## 2015-11-29 DIAGNOSIS — M9901 Segmental and somatic dysfunction of cervical region: Secondary | ICD-10-CM | POA: Diagnosis not present

## 2015-11-29 DIAGNOSIS — M545 Low back pain: Secondary | ICD-10-CM | POA: Diagnosis not present

## 2015-11-29 DIAGNOSIS — M9903 Segmental and somatic dysfunction of lumbar region: Secondary | ICD-10-CM | POA: Diagnosis not present

## 2015-12-01 DIAGNOSIS — M545 Low back pain: Secondary | ICD-10-CM | POA: Diagnosis not present

## 2015-12-01 DIAGNOSIS — M542 Cervicalgia: Secondary | ICD-10-CM | POA: Diagnosis not present

## 2015-12-01 DIAGNOSIS — M9903 Segmental and somatic dysfunction of lumbar region: Secondary | ICD-10-CM | POA: Diagnosis not present

## 2015-12-01 DIAGNOSIS — M9901 Segmental and somatic dysfunction of cervical region: Secondary | ICD-10-CM | POA: Diagnosis not present

## 2015-12-06 DIAGNOSIS — M9903 Segmental and somatic dysfunction of lumbar region: Secondary | ICD-10-CM | POA: Diagnosis not present

## 2015-12-06 DIAGNOSIS — M545 Low back pain: Secondary | ICD-10-CM | POA: Diagnosis not present

## 2015-12-06 DIAGNOSIS — M9901 Segmental and somatic dysfunction of cervical region: Secondary | ICD-10-CM | POA: Diagnosis not present

## 2015-12-06 DIAGNOSIS — M542 Cervicalgia: Secondary | ICD-10-CM | POA: Diagnosis not present

## 2015-12-08 DIAGNOSIS — M545 Low back pain: Secondary | ICD-10-CM | POA: Diagnosis not present

## 2015-12-08 DIAGNOSIS — M9901 Segmental and somatic dysfunction of cervical region: Secondary | ICD-10-CM | POA: Diagnosis not present

## 2015-12-08 DIAGNOSIS — M542 Cervicalgia: Secondary | ICD-10-CM | POA: Diagnosis not present

## 2015-12-08 DIAGNOSIS — M9903 Segmental and somatic dysfunction of lumbar region: Secondary | ICD-10-CM | POA: Diagnosis not present

## 2015-12-14 DIAGNOSIS — M0589 Other rheumatoid arthritis with rheumatoid factor of multiple sites: Secondary | ICD-10-CM | POA: Diagnosis not present

## 2015-12-28 DIAGNOSIS — Z6825 Body mass index (BMI) 25.0-25.9, adult: Secondary | ICD-10-CM | POA: Diagnosis not present

## 2015-12-28 DIAGNOSIS — I10 Essential (primary) hypertension: Secondary | ICD-10-CM | POA: Diagnosis not present

## 2015-12-28 DIAGNOSIS — M0549 Rheumatoid myopathy with rheumatoid arthritis of multiple sites: Secondary | ICD-10-CM | POA: Diagnosis not present

## 2016-01-03 DIAGNOSIS — M05639 Rheumatoid arthritis of unspecified wrist with involvement of other organs and systems: Secondary | ICD-10-CM | POA: Diagnosis not present

## 2016-01-03 DIAGNOSIS — J0101 Acute recurrent maxillary sinusitis: Secondary | ICD-10-CM | POA: Diagnosis not present

## 2016-01-04 DIAGNOSIS — Z780 Asymptomatic menopausal state: Secondary | ICD-10-CM | POA: Diagnosis not present

## 2016-01-04 DIAGNOSIS — Z79899 Other long term (current) drug therapy: Secondary | ICD-10-CM | POA: Diagnosis not present

## 2016-01-04 DIAGNOSIS — Z1231 Encounter for screening mammogram for malignant neoplasm of breast: Secondary | ICD-10-CM | POA: Diagnosis not present

## 2016-01-04 DIAGNOSIS — L400 Psoriasis vulgaris: Secondary | ICD-10-CM | POA: Diagnosis not present

## 2016-02-01 DIAGNOSIS — M5136 Other intervertebral disc degeneration, lumbar region: Secondary | ICD-10-CM | POA: Diagnosis not present

## 2016-02-01 DIAGNOSIS — M0589 Other rheumatoid arthritis with rheumatoid factor of multiple sites: Secondary | ICD-10-CM | POA: Diagnosis not present

## 2016-02-01 DIAGNOSIS — R21 Rash and other nonspecific skin eruption: Secondary | ICD-10-CM | POA: Diagnosis not present

## 2016-02-01 DIAGNOSIS — L4059 Other psoriatic arthropathy: Secondary | ICD-10-CM | POA: Diagnosis not present

## 2016-02-01 DIAGNOSIS — E663 Overweight: Secondary | ICD-10-CM | POA: Diagnosis not present

## 2016-02-01 DIAGNOSIS — Z6827 Body mass index (BMI) 27.0-27.9, adult: Secondary | ICD-10-CM | POA: Diagnosis not present

## 2016-02-01 DIAGNOSIS — M150 Primary generalized (osteo)arthritis: Secondary | ICD-10-CM | POA: Diagnosis not present

## 2016-02-01 DIAGNOSIS — L401 Generalized pustular psoriasis: Secondary | ICD-10-CM | POA: Diagnosis not present

## 2016-02-16 DIAGNOSIS — H25813 Combined forms of age-related cataract, bilateral: Secondary | ICD-10-CM | POA: Diagnosis not present

## 2016-02-16 DIAGNOSIS — H43811 Vitreous degeneration, right eye: Secondary | ICD-10-CM | POA: Diagnosis not present

## 2016-02-16 DIAGNOSIS — H47323 Drusen of optic disc, bilateral: Secondary | ICD-10-CM | POA: Diagnosis not present

## 2016-02-21 DIAGNOSIS — J399 Disease of upper respiratory tract, unspecified: Secondary | ICD-10-CM | POA: Diagnosis not present

## 2016-02-21 DIAGNOSIS — I10 Essential (primary) hypertension: Secondary | ICD-10-CM | POA: Diagnosis not present

## 2016-02-21 DIAGNOSIS — Z6826 Body mass index (BMI) 26.0-26.9, adult: Secondary | ICD-10-CM | POA: Diagnosis not present

## 2016-02-21 DIAGNOSIS — J028 Acute pharyngitis due to other specified organisms: Secondary | ICD-10-CM | POA: Diagnosis not present

## 2016-02-21 DIAGNOSIS — J349 Unspecified disorder of nose and nasal sinuses: Secondary | ICD-10-CM | POA: Diagnosis not present

## 2016-02-29 DIAGNOSIS — Z91018 Allergy to other foods: Secondary | ICD-10-CM | POA: Diagnosis not present

## 2016-02-29 DIAGNOSIS — Z91013 Allergy to seafood: Secondary | ICD-10-CM | POA: Diagnosis not present

## 2016-02-29 DIAGNOSIS — Z91010 Allergy to peanuts: Secondary | ICD-10-CM | POA: Diagnosis not present

## 2016-02-29 DIAGNOSIS — R21 Rash and other nonspecific skin eruption: Secondary | ICD-10-CM | POA: Diagnosis not present

## 2016-03-26 DIAGNOSIS — M542 Cervicalgia: Secondary | ICD-10-CM | POA: Diagnosis not present

## 2016-03-26 DIAGNOSIS — R220 Localized swelling, mass and lump, head: Secondary | ICD-10-CM | POA: Diagnosis not present

## 2016-03-28 ENCOUNTER — Ambulatory Visit
Admission: RE | Admit: 2016-03-28 | Discharge: 2016-03-28 | Disposition: A | Discharge status: Home / Self Care | Payer: Federal, State, Local not specified - PPO | Source: Ambulatory Visit | Attending: Family Medicine | Admitting: Family Medicine

## 2016-03-28 ENCOUNTER — Other Ambulatory Visit: Payer: Self-pay | Admitting: Family Medicine

## 2016-03-28 DIAGNOSIS — M542 Cervicalgia: Secondary | ICD-10-CM | POA: Diagnosis not present

## 2016-03-28 DIAGNOSIS — R609 Edema, unspecified: Secondary | ICD-10-CM

## 2016-03-28 DIAGNOSIS — R220 Localized swelling, mass and lump, head: Secondary | ICD-10-CM | POA: Diagnosis not present

## 2016-03-30 DIAGNOSIS — M0589 Other rheumatoid arthritis with rheumatoid factor of multiple sites: Secondary | ICD-10-CM | POA: Diagnosis not present

## 2016-04-05 DIAGNOSIS — R21 Rash and other nonspecific skin eruption: Secondary | ICD-10-CM | POA: Diagnosis not present

## 2016-04-05 DIAGNOSIS — M5136 Other intervertebral disc degeneration, lumbar region: Secondary | ICD-10-CM | POA: Diagnosis not present

## 2016-04-05 DIAGNOSIS — L401 Generalized pustular psoriasis: Secondary | ICD-10-CM | POA: Diagnosis not present

## 2016-04-05 DIAGNOSIS — M0589 Other rheumatoid arthritis with rheumatoid factor of multiple sites: Secondary | ICD-10-CM | POA: Diagnosis not present

## 2016-04-05 DIAGNOSIS — E663 Overweight: Secondary | ICD-10-CM | POA: Diagnosis not present

## 2016-04-05 DIAGNOSIS — Z6827 Body mass index (BMI) 27.0-27.9, adult: Secondary | ICD-10-CM | POA: Diagnosis not present

## 2016-04-05 DIAGNOSIS — L4059 Other psoriatic arthropathy: Secondary | ICD-10-CM | POA: Diagnosis not present

## 2016-04-05 DIAGNOSIS — M150 Primary generalized (osteo)arthritis: Secondary | ICD-10-CM | POA: Diagnosis not present

## 2016-04-11 DIAGNOSIS — L400 Psoriasis vulgaris: Secondary | ICD-10-CM | POA: Diagnosis not present

## 2016-04-11 DIAGNOSIS — L403 Pustulosis palmaris et plantaris: Secondary | ICD-10-CM | POA: Diagnosis not present

## 2016-04-25 DIAGNOSIS — I10 Essential (primary) hypertension: Secondary | ICD-10-CM | POA: Diagnosis not present

## 2016-04-25 DIAGNOSIS — M0549 Rheumatoid myopathy with rheumatoid arthritis of multiple sites: Secondary | ICD-10-CM | POA: Diagnosis not present

## 2016-04-27 DIAGNOSIS — Z79899 Other long term (current) drug therapy: Secondary | ICD-10-CM | POA: Diagnosis not present

## 2016-04-27 DIAGNOSIS — M0589 Other rheumatoid arthritis with rheumatoid factor of multiple sites: Secondary | ICD-10-CM | POA: Diagnosis not present

## 2016-06-04 DIAGNOSIS — J302 Other seasonal allergic rhinitis: Secondary | ICD-10-CM | POA: Diagnosis not present

## 2016-06-04 DIAGNOSIS — R0602 Shortness of breath: Secondary | ICD-10-CM | POA: Diagnosis not present

## 2016-06-04 DIAGNOSIS — R05 Cough: Secondary | ICD-10-CM | POA: Diagnosis not present

## 2016-06-06 DIAGNOSIS — R21 Rash and other nonspecific skin eruption: Secondary | ICD-10-CM | POA: Diagnosis not present

## 2016-06-06 DIAGNOSIS — M0589 Other rheumatoid arthritis with rheumatoid factor of multiple sites: Secondary | ICD-10-CM | POA: Diagnosis not present

## 2016-06-06 DIAGNOSIS — M5136 Other intervertebral disc degeneration, lumbar region: Secondary | ICD-10-CM | POA: Diagnosis not present

## 2016-06-06 DIAGNOSIS — Z6828 Body mass index (BMI) 28.0-28.9, adult: Secondary | ICD-10-CM | POA: Diagnosis not present

## 2016-06-06 DIAGNOSIS — L4059 Other psoriatic arthropathy: Secondary | ICD-10-CM | POA: Diagnosis not present

## 2016-06-06 DIAGNOSIS — L401 Generalized pustular psoriasis: Secondary | ICD-10-CM | POA: Diagnosis not present

## 2016-06-06 DIAGNOSIS — M150 Primary generalized (osteo)arthritis: Secondary | ICD-10-CM | POA: Diagnosis not present

## 2016-06-06 DIAGNOSIS — E663 Overweight: Secondary | ICD-10-CM | POA: Diagnosis not present

## 2016-06-22 DIAGNOSIS — M0589 Other rheumatoid arthritis with rheumatoid factor of multiple sites: Secondary | ICD-10-CM | POA: Diagnosis not present

## 2016-06-22 DIAGNOSIS — L4059 Other psoriatic arthropathy: Secondary | ICD-10-CM | POA: Diagnosis not present

## 2016-07-04 DIAGNOSIS — J069 Acute upper respiratory infection, unspecified: Secondary | ICD-10-CM | POA: Diagnosis not present

## 2016-07-04 IMAGING — CR DG CHEST 2V
2 series · 2 of 2 positions shown · non-contrast
Comparison: Chest x-ray of 06/10/2015

CLINICAL DATA: Recent pneumonia, followup, former smoking history

EXAM:
CHEST  2 VIEW

[w chest pa]
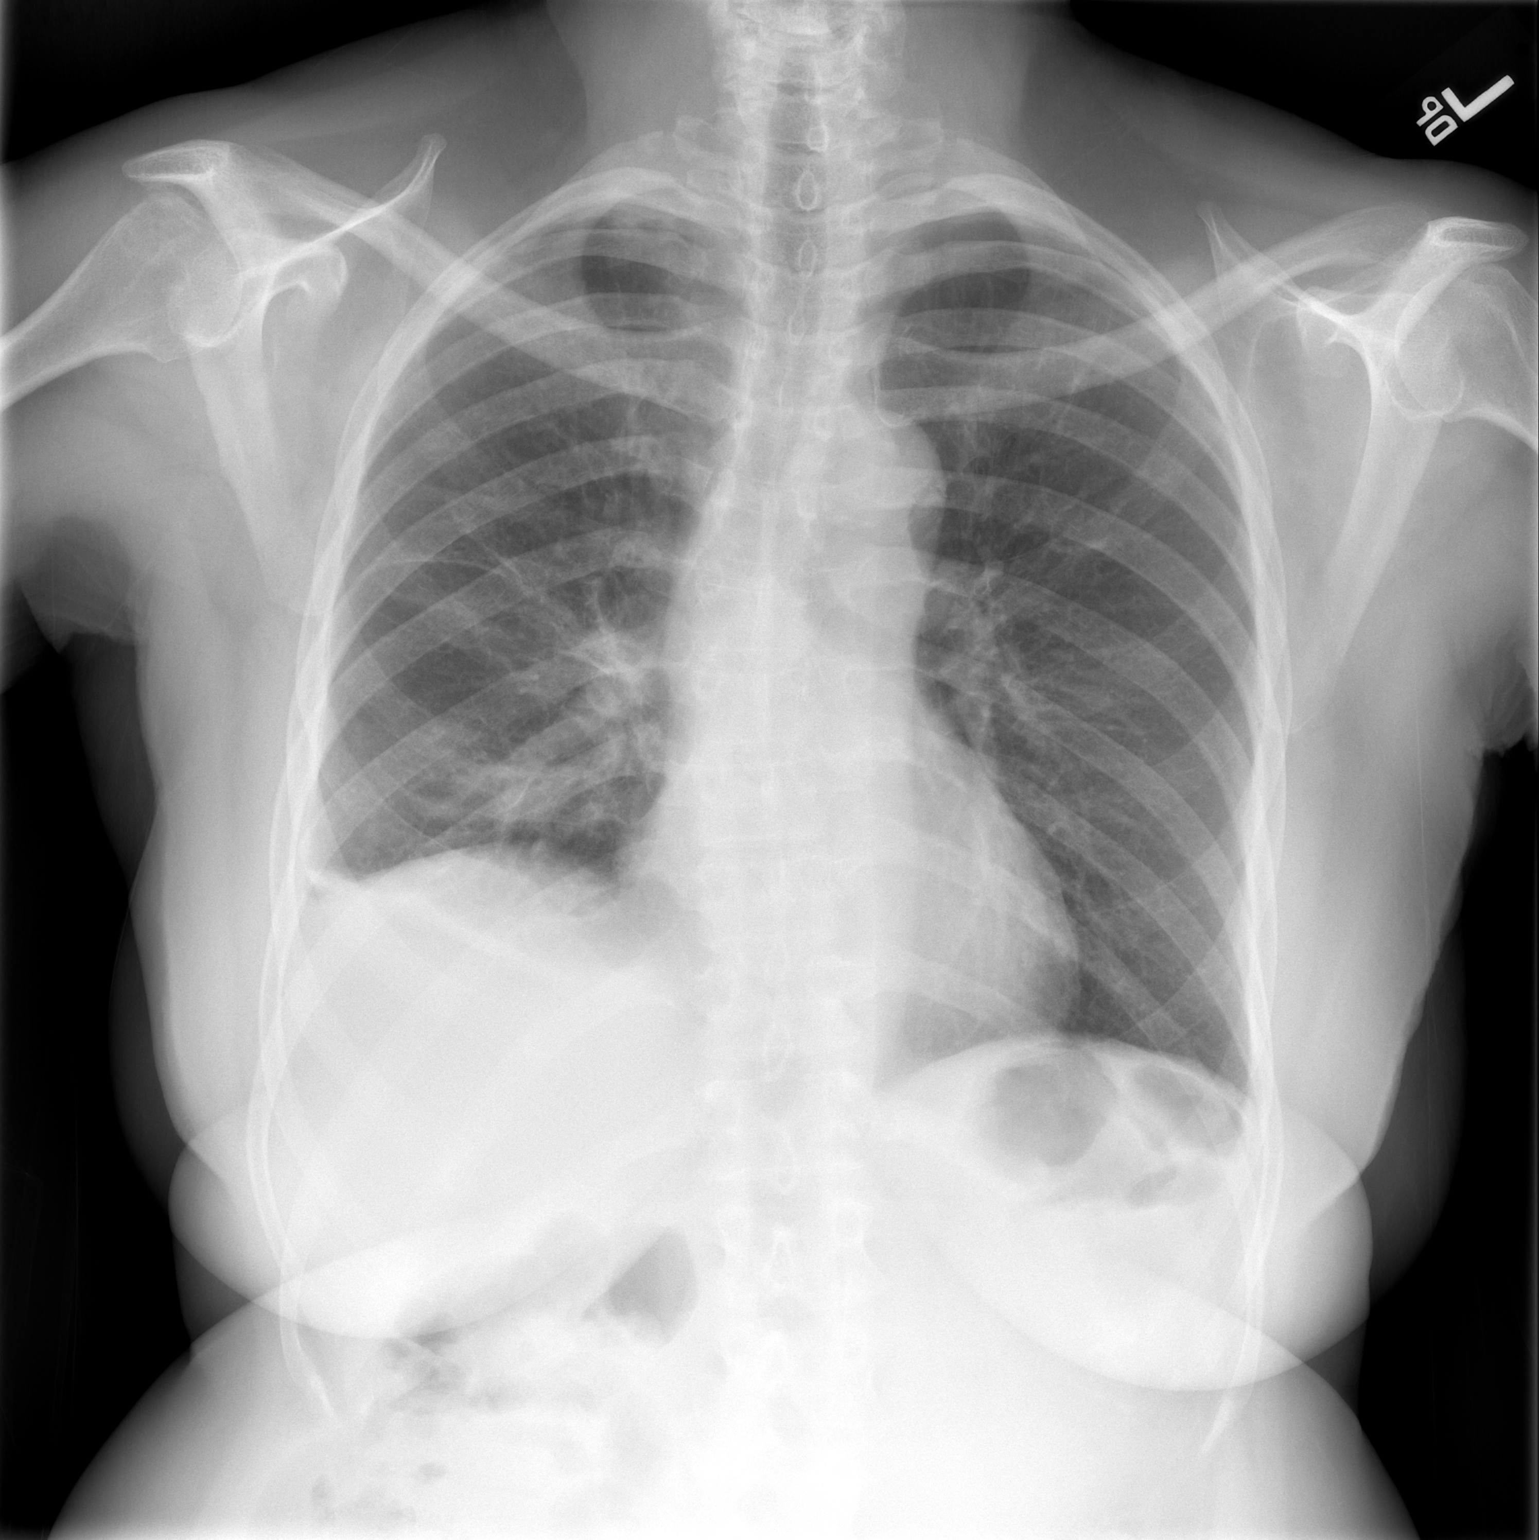

[w chest lat]
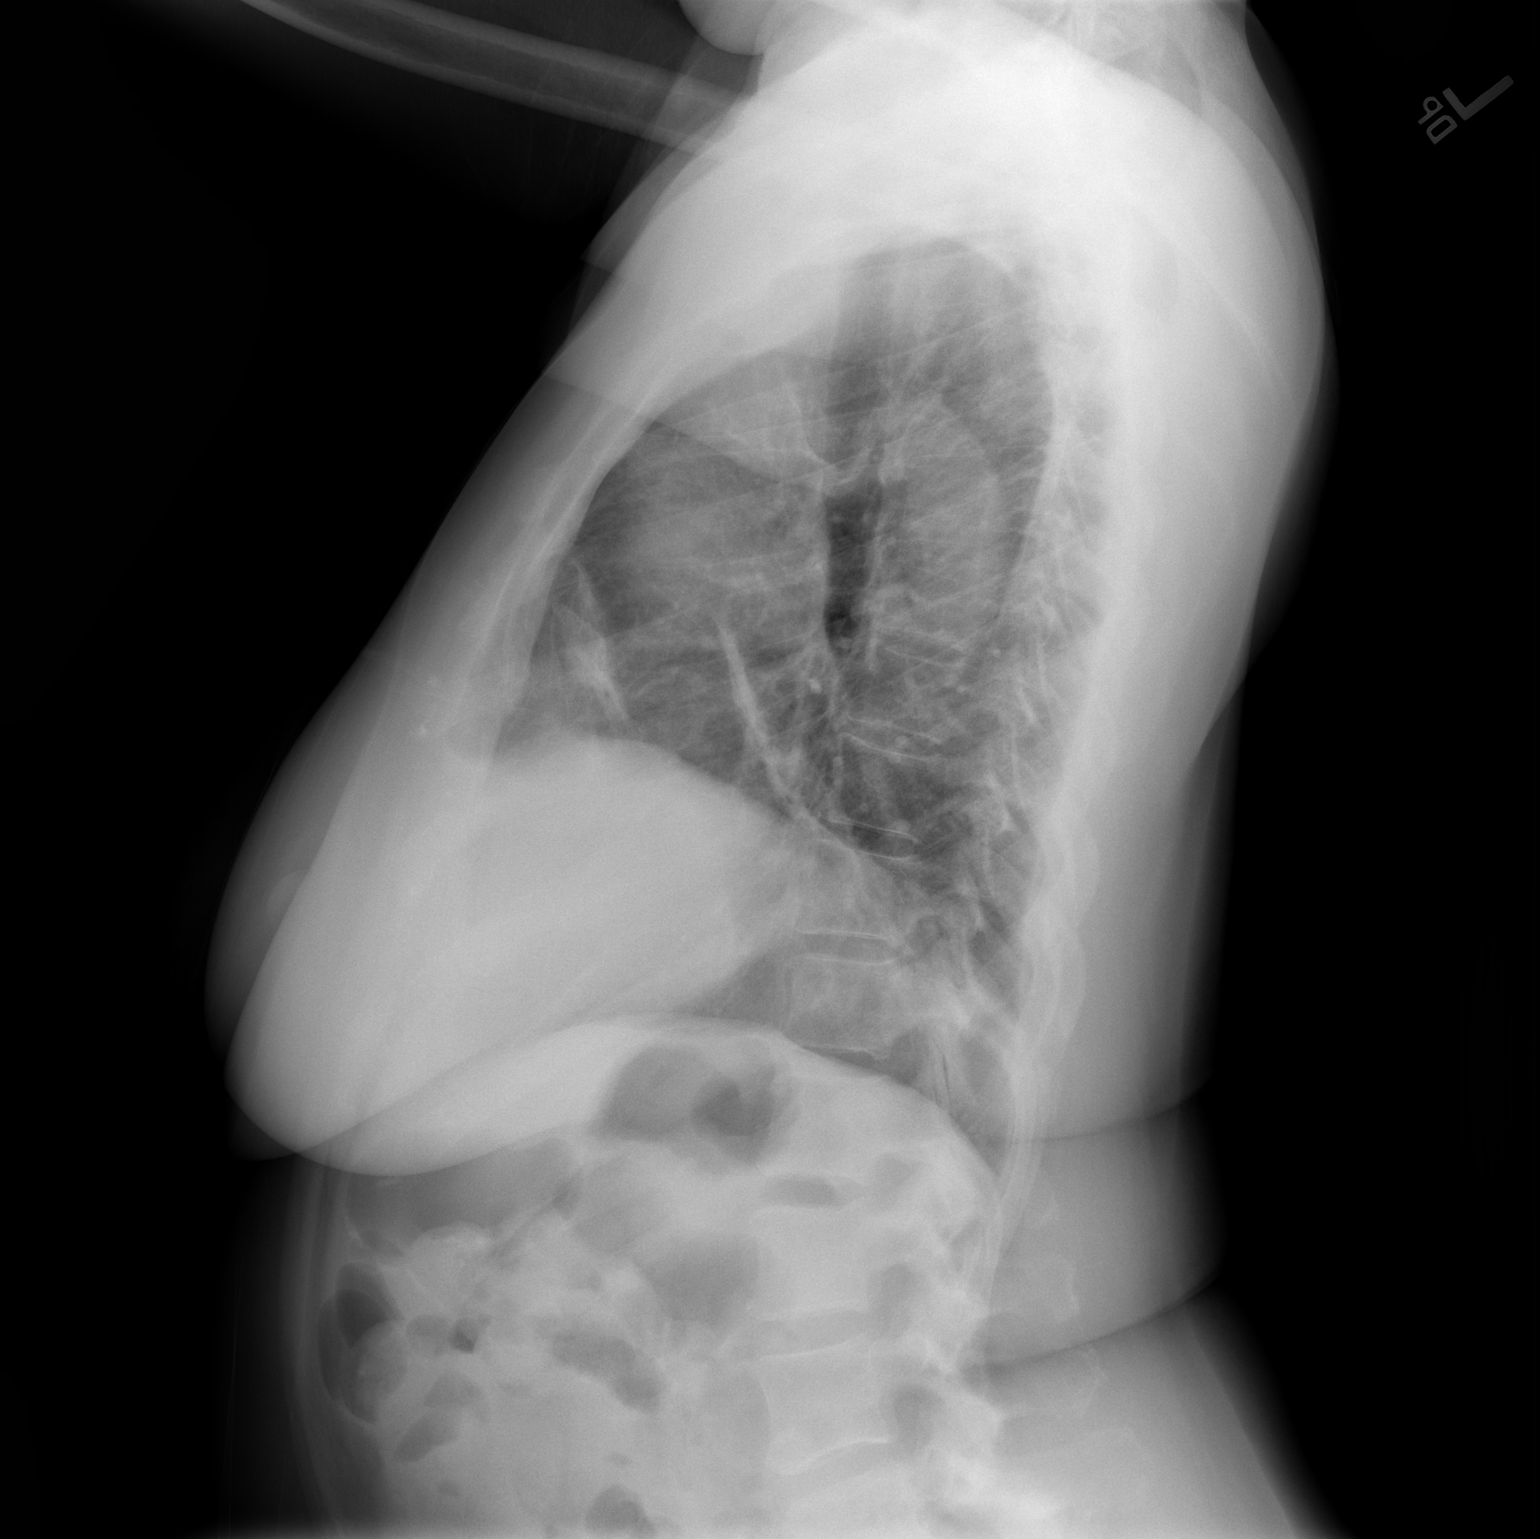

[2 of 2 positions shown; findings below may reference images not displayed]

FINDINGS: The left lung is not clear. The right upper lobe pneumonia has
cleared as well. However there is linear opacity at the right lung
base most consistent with atelectasis involving the right middle
lobe. No pleural effusion is seen. Mediastinal and hilar contours
are unremarkable. The heart is within normal limits in size.
IMPRESSION: Resolution of right upper lobe and left basilar pneumonia. Next item
increase in linear atelectasis the right lung base with elevation of
the right hemidiaphragm.

## 2016-07-11 DIAGNOSIS — L400 Psoriasis vulgaris: Secondary | ICD-10-CM | POA: Diagnosis not present

## 2016-08-02 DIAGNOSIS — I10 Essential (primary) hypertension: Secondary | ICD-10-CM | POA: Diagnosis not present

## 2016-08-02 DIAGNOSIS — M0549 Rheumatoid myopathy with rheumatoid arthritis of multiple sites: Secondary | ICD-10-CM | POA: Diagnosis not present

## 2016-08-06 ENCOUNTER — Ambulatory Visit: Payer: Medicare Other | Admitting: Cardiovascular Disease

## 2016-08-06 NOTE — Progress Notes (Deleted)
No chief complaint on file.   History of Present Illness: 71 yo female with history of HTN, HLD GERD and RA who is here today as a new consult for evaluation of   Primary Care Physician: [name], MD  Past Medical History:  Diagnosis Date  . Acid reflux   . AKI (acute kidney injury) (HCC) 06/07/2015  . Allergic sinusitis   . Anemia   . CAP (community acquired pneumonia) 06/07/2015  . Chest pain 01/01/2014  . Hyperlipidemia   . Hypertension   . Hypoxia   . Rheumatoid arthritis (HCC)    on Leflunomide   . Rheumatoid arthritis(714.0)    on Leflunomide    Past Surgical History:  Procedure Laterality Date  . ABDOMINAL HYSTERECTOMY  1985   Partial  . BUNIONECTOMY  2002,12/18/2006    Current Outpatient Prescriptions  Medication Sig Dispense Refill  . acetaminophen (TYLENOL) 500 MG tablet Take 500 mg by mouth every 6 (six) hours as needed for mild pain or headache.     Marland Kitchen acitretin (SORIATANE) 25 MG capsule Take 25 mg by mouth daily.  3  . amLODipine (NORVASC) 5 MG tablet Take 1 tablet (5 mg total) by mouth daily. 30 tablet 0  . calcipotriene (DOVONOX) 0.005 % ointment Apply 1 application topically 2 (two) times daily.     . cefpodoxime (VANTIN) 200 MG tablet Take 1 tablet (200 mg total) by mouth every 12 (twelve) hours. For 2days 5 tablet 0  . clindamycin (CLEOCIN) 300 MG capsule Take 600 mg by mouth. Two hours prior to dental work.    . clobetasol ointment (TEMOVATE) 0.05 % Apply 1 application topically 2 (two) times daily.     Marland Kitchen EPIPEN 2-PAK 0.3 MG/0.3ML SOAJ injection Inject 0.3 mg into the muscle as needed (for allergic reactions).     . fluticasone (FLONASE) 50 MCG/ACT nasal spray Place 2 sprays into both nostrils daily as needed for allergies.     . folic acid (FOLVITE) 1 MG tablet 2 TABLET BY MOUTH ONCE DAILY  3  . HYDROcodone-acetaminophen (NORCO/VICODIN) 5-325 MG per tablet Take 1 tablet by mouth every 6 (six) hours as needed for moderate pain.     Marland Kitchen levocetirizine  (XYZAL) 5 MG tablet Take 5 mg by mouth at bedtime.     . Menthol, Topical Analgesic, (BIOFREEZE EX) Apply 1 application topically daily as needed (knee pain.).    Marland Kitchen valACYclovir (VALTREX) 500 MG tablet Take 500 mg by mouth daily.  5  . VITAMIN D, ERGOCALCIFEROL, PO Take 1 capsule by mouth once a week. OVER THE COUNTER DOSAGE UNKNOWN, ON FRIDAYS     No current facility-administered medications for this visit.     Allergies  Allergen Reactions  . Peanut-Containing Drug Products Anaphylaxis  . Shellfish Allergy Anaphylaxis  . Lyrica [Pregabalin] Other (See Comments)    Causes rheumatoid flares  . Penicillins Other (See Comments)    Corners of mouth started splitting  . Sulfa Antibiotics Other (See Comments)    unknown  . Leflunomide Rash    Social History   Social History  . Marital status: Divorced    Spouse name: N/A  . Number of children: N/A  . Years of education: N/A   Occupational History  . Not on file.   Social History Main Topics  . Smoking status: Former Smoker    Types: Cigarettes    Quit date: 02/20/2003  . Smokeless tobacco: Never Used  . Alcohol use No  . Drug use: No  .  Sexual activity: Not on file   Other Topics Concern  . Not on file   Social History Narrative  . No narrative on file    Family History  Problem Relation Age of Onset  . Heart disease Father   . Hypertension Sister   . Lupus Brother   . Cancer Brother        Colon    Review of Systems:  As stated in the HPI and otherwise negative.   There were no vitals taken for this visit.  Physical Examination: General: Well developed, well nourished, NAD  HEENT: OP clear, mucus membranes moist  SKIN: warm, dry. No rashes. Neuro: No focal deficits  Musculoskeletal: Muscle strength 5/5 all ext  Psychiatric: Mood and affect normal  Neck: No JVD, no carotid bruits, no thyromegaly, no lymphadenopathy.  Lungs:Clear bilaterally, no wheezes, rhonci, crackles Cardiovascular: Regular rate and  rhythm. No murmurs, gallops or rubs. Abdomen:Soft. Bowel sounds present. Non-tender.  Extremities: No lower extremity edema. Pulses are 2 + in the bilateral DP/PT.  EKG:  EKG {ACTION; IS/IS WGN:56213086} ordered today. The ekg ordered today demonstrates ***  Recent Labs: No results found for requested labs within last 8760 hours.   Lipid Panel    Component Value Date/Time   CHOL 209 (H) 01/02/2014 0858   TRIG 47 01/02/2014 0858   HDL 78 01/02/2014 0858   CHOLHDL 2.7 01/02/2014 0858   VLDL 9 01/02/2014 0858   LDLCALC 122 (H) 01/02/2014 0858     Wt Readings from Last 3 Encounters:  06/07/15 150 lb (68 kg)  01/01/14 175 lb 14.8 oz (79.8 kg)  12/23/12 177 lb 6.4 oz (80.5 kg)     Other studies Reviewed: Additional studies/ records that were reviewed today include: ***. Review of the above records demonstrates: ***  Assessment and Plan:   Current medicines are reviewed at length with the patient today.  The patient {ACTIONS; HAS/DOES NOT HAVE:19233} concerns regarding medicines.  The following changes have been made:  {PLAN; NO CHANGE:13088:s}  Labs/ tests ordered today include: *** No orders of the defined types were placed in this encounter.    Disposition:   FU with *** in {gen number 5-78:469629} {TIME; UNITS DAY/WEEK/MONTH:19136}   Signed, Verne Carrow, MD 08/06/2016 7:41 AM    [facility] [address] [place], [place], [place]  [address] Phone: [phone]; Fax: [phone]

## 2016-08-09 ENCOUNTER — Encounter: Payer: Self-pay | Admitting: Cardiovascular Disease

## 2016-08-16 DIAGNOSIS — R002 Palpitations: Secondary | ICD-10-CM | POA: Insufficient documentation

## 2016-08-17 ENCOUNTER — Ambulatory Visit (INDEPENDENT_AMBULATORY_CARE_PROVIDER_SITE_OTHER): Payer: Medicare Other

## 2016-08-17 DIAGNOSIS — Z79899 Other long term (current) drug therapy: Secondary | ICD-10-CM | POA: Diagnosis not present

## 2016-08-17 DIAGNOSIS — M0589 Other rheumatoid arthritis with rheumatoid factor of multiple sites: Secondary | ICD-10-CM | POA: Diagnosis not present

## 2016-08-17 DIAGNOSIS — R002 Palpitations: Secondary | ICD-10-CM

## 2016-08-23 DIAGNOSIS — I10 Essential (primary) hypertension: Secondary | ICD-10-CM | POA: Diagnosis not present

## 2016-08-27 DIAGNOSIS — K259 Gastric ulcer, unspecified as acute or chronic, without hemorrhage or perforation: Secondary | ICD-10-CM | POA: Diagnosis not present

## 2016-08-27 DIAGNOSIS — K219 Gastro-esophageal reflux disease without esophagitis: Secondary | ICD-10-CM | POA: Diagnosis not present

## 2016-08-27 DIAGNOSIS — R1012 Left upper quadrant pain: Secondary | ICD-10-CM | POA: Diagnosis not present

## 2016-08-27 DIAGNOSIS — R140 Abdominal distension (gaseous): Secondary | ICD-10-CM | POA: Diagnosis not present

## 2016-09-03 DIAGNOSIS — I10 Essential (primary) hypertension: Secondary | ICD-10-CM | POA: Diagnosis not present

## 2016-09-03 DIAGNOSIS — J441 Chronic obstructive pulmonary disease with (acute) exacerbation: Secondary | ICD-10-CM | POA: Diagnosis not present

## 2016-09-03 DIAGNOSIS — I499 Cardiac arrhythmia, unspecified: Secondary | ICD-10-CM | POA: Diagnosis not present

## 2016-09-03 DIAGNOSIS — M0549 Rheumatoid myopathy with rheumatoid arthritis of multiple sites: Secondary | ICD-10-CM | POA: Diagnosis not present

## 2016-09-05 DIAGNOSIS — L4059 Other psoriatic arthropathy: Secondary | ICD-10-CM | POA: Diagnosis not present

## 2016-09-05 DIAGNOSIS — L401 Generalized pustular psoriasis: Secondary | ICD-10-CM | POA: Diagnosis not present

## 2016-09-05 DIAGNOSIS — M0589 Other rheumatoid arthritis with rheumatoid factor of multiple sites: Secondary | ICD-10-CM | POA: Diagnosis not present

## 2016-09-05 DIAGNOSIS — M150 Primary generalized (osteo)arthritis: Secondary | ICD-10-CM | POA: Diagnosis not present

## 2016-09-05 DIAGNOSIS — M5136 Other intervertebral disc degeneration, lumbar region: Secondary | ICD-10-CM | POA: Diagnosis not present

## 2016-09-05 DIAGNOSIS — E663 Overweight: Secondary | ICD-10-CM | POA: Diagnosis not present

## 2016-09-05 DIAGNOSIS — R21 Rash and other nonspecific skin eruption: Secondary | ICD-10-CM | POA: Diagnosis not present

## 2016-09-05 DIAGNOSIS — Z6829 Body mass index (BMI) 29.0-29.9, adult: Secondary | ICD-10-CM | POA: Diagnosis not present

## 2016-10-10 DIAGNOSIS — Z0000 Encounter for general adult medical examination without abnormal findings: Secondary | ICD-10-CM | POA: Diagnosis not present

## 2016-10-11 DIAGNOSIS — L400 Psoriasis vulgaris: Secondary | ICD-10-CM | POA: Diagnosis not present

## 2016-10-12 DIAGNOSIS — M0589 Other rheumatoid arthritis with rheumatoid factor of multiple sites: Secondary | ICD-10-CM | POA: Diagnosis not present

## 2016-10-12 DIAGNOSIS — Z79899 Other long term (current) drug therapy: Secondary | ICD-10-CM | POA: Diagnosis not present

## 2016-10-17 DIAGNOSIS — I10 Essential (primary) hypertension: Secondary | ICD-10-CM | POA: Diagnosis not present

## 2016-10-17 DIAGNOSIS — M0549 Rheumatoid myopathy with rheumatoid arthritis of multiple sites: Secondary | ICD-10-CM | POA: Diagnosis not present

## 2016-12-06 DIAGNOSIS — J069 Acute upper respiratory infection, unspecified: Secondary | ICD-10-CM | POA: Diagnosis not present

## 2016-12-07 DIAGNOSIS — Z79899 Other long term (current) drug therapy: Secondary | ICD-10-CM | POA: Diagnosis not present

## 2016-12-07 DIAGNOSIS — M0589 Other rheumatoid arthritis with rheumatoid factor of multiple sites: Secondary | ICD-10-CM | POA: Diagnosis not present

## 2016-12-19 DIAGNOSIS — I10 Essential (primary) hypertension: Secondary | ICD-10-CM | POA: Diagnosis not present

## 2017-01-02 DIAGNOSIS — L400 Psoriasis vulgaris: Secondary | ICD-10-CM | POA: Diagnosis not present

## 2017-01-07 DIAGNOSIS — L4059 Other psoriatic arthropathy: Secondary | ICD-10-CM | POA: Diagnosis not present

## 2017-01-07 DIAGNOSIS — E669 Obesity, unspecified: Secondary | ICD-10-CM | POA: Diagnosis not present

## 2017-01-07 DIAGNOSIS — L401 Generalized pustular psoriasis: Secondary | ICD-10-CM | POA: Diagnosis not present

## 2017-01-07 DIAGNOSIS — M0589 Other rheumatoid arthritis with rheumatoid factor of multiple sites: Secondary | ICD-10-CM | POA: Diagnosis not present

## 2017-01-07 DIAGNOSIS — M150 Primary generalized (osteo)arthritis: Secondary | ICD-10-CM | POA: Diagnosis not present

## 2017-01-07 DIAGNOSIS — Z6831 Body mass index (BMI) 31.0-31.9, adult: Secondary | ICD-10-CM | POA: Diagnosis not present

## 2017-01-07 DIAGNOSIS — R21 Rash and other nonspecific skin eruption: Secondary | ICD-10-CM | POA: Diagnosis not present

## 2017-01-07 DIAGNOSIS — M5136 Other intervertebral disc degeneration, lumbar region: Secondary | ICD-10-CM | POA: Diagnosis not present

## 2017-02-01 DIAGNOSIS — Z79899 Other long term (current) drug therapy: Secondary | ICD-10-CM | POA: Diagnosis not present

## 2017-02-01 DIAGNOSIS — M0589 Other rheumatoid arthritis with rheumatoid factor of multiple sites: Secondary | ICD-10-CM | POA: Diagnosis not present

## 2017-02-28 DIAGNOSIS — Z91010 Allergy to peanuts: Secondary | ICD-10-CM | POA: Diagnosis not present

## 2017-02-28 DIAGNOSIS — Z91013 Allergy to seafood: Secondary | ICD-10-CM | POA: Diagnosis not present

## 2017-02-28 DIAGNOSIS — R21 Rash and other nonspecific skin eruption: Secondary | ICD-10-CM | POA: Diagnosis not present

## 2017-02-28 DIAGNOSIS — J3089 Other allergic rhinitis: Secondary | ICD-10-CM | POA: Diagnosis not present

## 2017-03-25 DIAGNOSIS — R0602 Shortness of breath: Secondary | ICD-10-CM | POA: Diagnosis not present

## 2017-03-29 DIAGNOSIS — M0589 Other rheumatoid arthritis with rheumatoid factor of multiple sites: Secondary | ICD-10-CM | POA: Diagnosis not present

## 2017-04-03 DIAGNOSIS — L400 Psoriasis vulgaris: Secondary | ICD-10-CM | POA: Diagnosis not present

## 2017-04-10 DIAGNOSIS — I10 Essential (primary) hypertension: Secondary | ICD-10-CM | POA: Diagnosis not present

## 2017-04-10 DIAGNOSIS — R0602 Shortness of breath: Secondary | ICD-10-CM | POA: Diagnosis not present

## 2017-04-17 DIAGNOSIS — R0609 Other forms of dyspnea: Secondary | ICD-10-CM | POA: Diagnosis not present

## 2017-04-17 DIAGNOSIS — E785 Hyperlipidemia, unspecified: Secondary | ICD-10-CM | POA: Diagnosis not present

## 2017-04-17 DIAGNOSIS — I10 Essential (primary) hypertension: Secondary | ICD-10-CM | POA: Diagnosis not present

## 2017-04-17 DIAGNOSIS — Z87891 Personal history of nicotine dependence: Secondary | ICD-10-CM | POA: Diagnosis not present

## 2017-04-26 DIAGNOSIS — R0609 Other forms of dyspnea: Secondary | ICD-10-CM | POA: Diagnosis not present

## 2017-04-26 DIAGNOSIS — I10 Essential (primary) hypertension: Secondary | ICD-10-CM | POA: Diagnosis not present

## 2017-04-26 DIAGNOSIS — Z87891 Personal history of nicotine dependence: Secondary | ICD-10-CM | POA: Diagnosis not present

## 2017-05-07 DIAGNOSIS — M150 Primary generalized (osteo)arthritis: Secondary | ICD-10-CM | POA: Diagnosis not present

## 2017-05-07 DIAGNOSIS — L4059 Other psoriatic arthropathy: Secondary | ICD-10-CM | POA: Diagnosis not present

## 2017-05-07 DIAGNOSIS — M5136 Other intervertebral disc degeneration, lumbar region: Secondary | ICD-10-CM | POA: Diagnosis not present

## 2017-05-07 DIAGNOSIS — R21 Rash and other nonspecific skin eruption: Secondary | ICD-10-CM | POA: Diagnosis not present

## 2017-05-07 DIAGNOSIS — E669 Obesity, unspecified: Secondary | ICD-10-CM | POA: Diagnosis not present

## 2017-05-07 DIAGNOSIS — Z6832 Body mass index (BMI) 32.0-32.9, adult: Secondary | ICD-10-CM | POA: Diagnosis not present

## 2017-05-07 DIAGNOSIS — R0602 Shortness of breath: Secondary | ICD-10-CM | POA: Diagnosis not present

## 2017-05-07 DIAGNOSIS — L401 Generalized pustular psoriasis: Secondary | ICD-10-CM | POA: Diagnosis not present

## 2017-05-07 DIAGNOSIS — M0589 Other rheumatoid arthritis with rheumatoid factor of multiple sites: Secondary | ICD-10-CM | POA: Diagnosis not present

## 2017-05-08 DIAGNOSIS — R0609 Other forms of dyspnea: Secondary | ICD-10-CM | POA: Diagnosis not present

## 2017-05-15 DIAGNOSIS — E785 Hyperlipidemia, unspecified: Secondary | ICD-10-CM | POA: Diagnosis not present

## 2017-05-15 DIAGNOSIS — I10 Essential (primary) hypertension: Secondary | ICD-10-CM | POA: Diagnosis not present

## 2017-05-15 DIAGNOSIS — R0609 Other forms of dyspnea: Secondary | ICD-10-CM | POA: Diagnosis not present

## 2017-05-15 DIAGNOSIS — Z87891 Personal history of nicotine dependence: Secondary | ICD-10-CM | POA: Diagnosis not present

## 2017-05-16 DIAGNOSIS — R0602 Shortness of breath: Secondary | ICD-10-CM | POA: Diagnosis not present

## 2017-05-16 DIAGNOSIS — I10 Essential (primary) hypertension: Secondary | ICD-10-CM | POA: Diagnosis not present

## 2017-05-24 DIAGNOSIS — M0589 Other rheumatoid arthritis with rheumatoid factor of multiple sites: Secondary | ICD-10-CM | POA: Diagnosis not present

## 2017-05-27 ENCOUNTER — Encounter: Payer: Self-pay | Admitting: Pulmonary Disease

## 2017-05-27 ENCOUNTER — Ambulatory Visit (INDEPENDENT_AMBULATORY_CARE_PROVIDER_SITE_OTHER): Payer: Medicare Other | Admitting: Pulmonary Disease

## 2017-05-27 VITALS — BP 130/60 | HR 67 | Ht 66.0 in | Wt 195.6 lb

## 2017-05-27 DIAGNOSIS — J849 Interstitial pulmonary disease, unspecified: Secondary | ICD-10-CM

## 2017-05-27 DIAGNOSIS — R0602 Shortness of breath: Secondary | ICD-10-CM | POA: Diagnosis not present

## 2017-05-27 NOTE — Patient Instructions (Signed)
We will check pulmonary function test and a high-resolution CT to fully evaluate the lung and find out the reason for shortness of breath I will see her back in clinic in 2-4 weeks after these tests for review and plan for further steps

## 2017-05-27 NOTE — Progress Notes (Signed)
Cheyenne Gould    [id number]    [birth year]  Primary Care Physician:Bland, Adrian Saran, MD  Referring Physician: Yates Decamp, MD [address] [place], [place] [address]  Chief complaint: Consult for dyspnea  HPI: 72 year old with history of rheumatoid, psoriatic arthritis, pustular psoriasis, GERD, hypertension.  Here for evaluation of dyspnea This is been progressive over the past year.  Has only dyspnea on exertion, no symptoms at rest.  Denies any cough, sputum production, fevers, chills.  She follows with Dr. Dierdre Forth for arthritis.  She had been previously treated with [place] which had to be stopped due to side effects and Remicade which had to be stopped after she developed sepsis and pneumonia.  She also had been tried on [place] with no efficacy.  She is currently on Simponi and methotrexate.  She was evaluated by Dr. Jacinto Halim had an echo and stress test which are normal as per the patient   She has history of GERD and is on Prilosec.  Notices that dyspnea is better when she stops the Prilosec however has recurrence of heartburn when off Prilosec.  Has had a 30 pound weight gain over the past 1 year but feels that her dyspnea is not related to the weight gain.  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  Quit in 2005 Travel History: Lived in [place] for 5 years otherwise was a resident of [place].  No significant travel.  Outpatient Encounter Medications as of 05/27/2017  Medication Sig  . acetaminophen (TYLENOL) 500 MG tablet Take 500 mg by mouth every 6 (six) hours as needed for mild pain or headache.   Marland Kitchen amLODipine (NORVASC) 5 MG tablet Take 1 tablet (5 mg total) by mouth daily.  . Cholecalciferol (VITAMIN D3) 2000 units TABS Take by mouth.  . clindamycin (CLEOCIN) 300 MG capsule Take 600 mg by  mouth. Two hours prior to dental work.  . clobetasol (OLUX) 0.05 % topical foam Apply topically 2 (two) times daily.  Marland Kitchen EPIPEN 2-PAK 0.3 MG/0.3ML SOAJ injection Inject 0.3 mg into the muscle as needed (for allergic reactions).   . folic acid (FOLVITE) 1 MG tablet 2 TABLET BY MOUTH ONCE DAILY  . golimumab 2 mg/kg in sodium chloride 0.9 % Inject 2 mg/kg into the vein once.  Marland Kitchen levocetirizine (XYZAL) 5 MG tablet Take 5 mg by mouth at bedtime.   . methotrexate (RHEUMATREX) 2.5 MG tablet 6 TABLETS ONCE A WEEK ORALLY 90 DAYS  . metoprolol tartrate (LOPRESSOR) 25 MG tablet TAKE ONE TABLET BY MOUTH DAILY FOR HEART  . omeprazole (PRILOSEC) 40 MG capsule Take by mouth daily.  . traMADol (ULTRAM) 50 MG tablet Take by mouth every 6 (six) hours as needed.  Marland Kitchen VITAMIN D, ERGOCALCIFEROL, PO Take 1 capsule by mouth once a week. OVER THE COUNTER DOSAGE UNKNOWN, ON FRIDAYS  . [DISCONTINUED] HYDROcodone-acetaminophen (NORCO/VICODIN) 5-325 MG per tablet Take 1 tablet by mouth every 6 (six) hours as needed for moderate pain.   . [DISCONTINUED] acitretin (SORIATANE) 25 MG capsule Take 25 mg by mouth daily.  . [DISCONTINUED] calcipotriene (DOVONOX) 0.005 % ointment Apply 1 application topically 2 (two) times daily.   . [DISCONTINUED] cefpodoxime (VANTIN) 200 MG tablet Take 1 tablet (200 mg total) by mouth every 12 (twelve) hours. For 2days  . [DISCONTINUED] clobetasol  ointment (TEMOVATE) 0.05 % Apply 1 application topically 2 (two) times daily.   . [DISCONTINUED] fluticasone (FLONASE) 50 MCG/ACT nasal spray Place 2 sprays into both nostrils daily as needed for allergies.   . [DISCONTINUED] Menthol, Topical Analgesic, (BIOFREEZE EX) Apply 1 application topically daily as needed (knee pain.).  . [DISCONTINUED] valACYclovir (VALTREX) 500 MG tablet Take 500 mg by mouth daily.   No facility-administered encounter medications on file as of 05/27/2017.     Allergies as of 05/27/2017 - Review Complete 05/27/2017  Allergen  Reaction Noted  . Peanut-containing drug products Anaphylaxis 01/18/2011  . Shellfish allergy Anaphylaxis 01/15/2011  . Lyrica [pregabalin] Other (See Comments) 12/23/2012  . Penicillins Other (See Comments) 01/15/2011  . Sulfa antibiotics Other (See Comments) 01/15/2011  . Leflunomide Rash 09/04/2014    Past Medical History:  Diagnosis Date  . Acid reflux   . AKI (acute kidney injury) (HCC) 06/07/2015  . Allergic sinusitis   . Anemia   . CAP (community acquired pneumonia) 06/07/2015  . Chest pain 01/01/2014  . Hyperlipidemia   . Hypertension   . Hypoxia   . Rheumatoid arthritis (HCC)    on Leflunomide   . Rheumatoid arthritis(714.0)    on Leflunomide    Past Surgical History:  Procedure Laterality Date  . ABDOMINAL HYSTERECTOMY  1985   Partial  . BUNIONECTOMY  2002,12/18/2006    Family History  Problem Relation Age of Onset  . Heart disease Father   . Hypertension Sister   . Lupus Brother   . Cancer Brother        Colon    Social History   Socioeconomic History  . Marital status: Divorced    Spouse name: Not on file  . Number of children: Not on file  . Years of education: Not on file  . Highest education level: Not on file  Occupational History  . Not on file  Social Needs  . Financial resource strain: Not on file  . Food insecurity:    Worry: Not on file    Inability: Not on file  . Transportation needs:    Medical: Not on file    Non-medical: Not on file  Tobacco Use  . Smoking status: Former Smoker    Packs/day: 1.00    Years: 40.00    Pack years: 40.00    Types: Cigarettes    Last attempt to quit: 02/20/2003    Years since quitting: 14.2  . Smokeless tobacco: Never Used  Substance and Sexual Activity  . Alcohol use: No  . Drug use: No  . Sexual activity: Not on file  Lifestyle  . Physical activity:    Days per week: Not on file    Minutes per session: Not on file  . Stress: Not on file  Relationships  . Social connections:    Talks on  phone: Not on file    Gets together: Not on file    Attends religious service: Not on file    Active member of club or organization: Not on file    Attends meetings of clubs or organizations: Not on file    Relationship status: Not on file  . Intimate partner violence:    Fear of current or ex partner: Not on file    Emotionally abused: Not on file    Physically abused: Not on file    Forced sexual activity: Not on file  Other Topics Concern  . Not on file  Social History Narrative  . Not on  file    Review of systems: Review of Systems  Constitutional: Negative for fever and chills.  HENT: Negative.   Eyes: Negative for blurred vision.  Respiratory: as per HPI  Cardiovascular: Negative for chest pain and palpitations.  Gastrointestinal: Negative for vomiting, diarrhea, blood per rectum. Genitourinary: Negative for dysuria, urgency, frequency and hematuria.  Musculoskeletal: Negative for myalgias, back pain and joint pain.  Skin: Negative for itching and rash.  Neurological: Negative for dizziness, tremors, focal weakness, seizures and loss of consciousness.  Endo/Heme/Allergies: Negative for environmental allergies.  Psychiatric/Behavioral: Negative for depression, suicidal ideas and hallucinations.  All other systems reviewed and are negative.  Physical Exam: Blood pressure 130/60, pulse 67, height 5\' 6"  (1.676 m), weight 195 lb 9.6 oz (88.7 kg), SpO2 93 %. Gen:      No acute distress HEENT:  EOMI, sclera anicteric Neck:     No masses; no thyromegaly Lungs:    Clear to auscultation bilaterally; normal respiratory effort CV:         Regular rate and rhythm; no murmurs Abd:      + bowel sounds; soft, non-tender; no palpable masses, no distension Ext:    No edema; adequate peripheral perfusion Skin:      Warm and dry; no rash Neuro: alert and oriented x 3 Psych: normal mood and affect  Data Reviewed: Chest x-ray 06/10/15-bilateral lung infiltrates Chest x-ray  06/25/15-resolution of right upper lobe infiltrate and left basilar pneumonia.  There is linear atelectasis at the right base with right diaphragm elevation. I have reviewed the images personally.  Assessment:  Evaluation for dyspnea Will need evaluation for COPD given her smoking history and also for interstitial lung disease as she has history of rheumatoid arthritis and is on methotrexate, immuno modulators Schedule high-resolution CT and pulmonary function tests  Plan/Recommendations: - High Resolution CT, PFTs  Chilton Greathouse MD [facility] Pager [phone] 05/27/2017, 2:11 PM  CC: Yates Decamp, MD

## 2017-05-29 DIAGNOSIS — R0602 Shortness of breath: Secondary | ICD-10-CM | POA: Diagnosis not present

## 2017-05-29 DIAGNOSIS — M0570 Rheumatoid arthritis with rheumatoid factor of unspecified site without organ or systems involvement: Secondary | ICD-10-CM | POA: Diagnosis not present

## 2017-05-29 DIAGNOSIS — K259 Gastric ulcer, unspecified as acute or chronic, without hemorrhage or perforation: Secondary | ICD-10-CM | POA: Diagnosis not present

## 2017-06-05 ENCOUNTER — Ambulatory Visit (INDEPENDENT_AMBULATORY_CARE_PROVIDER_SITE_OTHER)
Admission: RE | Admit: 2017-06-05 | Discharge: 2017-06-05 | Disposition: A | Discharge status: Home / Self Care | Payer: Medicare Other | Source: Ambulatory Visit | Attending: Pulmonary Disease | Admitting: Pulmonary Disease

## 2017-06-05 DIAGNOSIS — J849 Interstitial pulmonary disease, unspecified: Secondary | ICD-10-CM

## 2017-06-05 DIAGNOSIS — R918 Other nonspecific abnormal finding of lung field: Secondary | ICD-10-CM | POA: Diagnosis not present

## 2017-06-18 ENCOUNTER — Telehealth: Payer: Self-pay | Admitting: Pulmonary Disease

## 2017-06-18 DIAGNOSIS — R918 Other nonspecific abnormal finding of lung field: Secondary | ICD-10-CM

## 2017-06-18 NOTE — Telephone Encounter (Signed)
Order has been placed and patient is aware. Nothing further needed.  

## 2017-06-18 NOTE — Telephone Encounter (Signed)
   Notes recorded by Chilton Greathouse, MD on 06/17/2017 at 10:47 AM EDT Please let patient know that there is no evidence of interstitial lung disease. There is mild scarring and small lung nodules.  Order follow up CT chest without contrast in 3-4 months.  There is also evidence of coronary plaque. Please ask her to follow with there primary care about this.  Advised pt of results. Pt understood. Dr. Isaiah Serge do you want High resolution?

## 2017-06-18 NOTE — Telephone Encounter (Signed)
Not a high resolution. She needs a regular CT without contrast for nodule followup

## 2017-06-18 NOTE — Addendum Note (Signed)
Addended by: Etheleen Mayhew C on: 06/18/2017 01:43 PM   Modules accepted: Orders

## 2017-06-26 DIAGNOSIS — K219 Gastro-esophageal reflux disease without esophagitis: Secondary | ICD-10-CM | POA: Diagnosis not present

## 2017-06-27 DIAGNOSIS — M0549 Rheumatoid myopathy with rheumatoid arthritis of multiple sites: Secondary | ICD-10-CM | POA: Diagnosis not present

## 2017-06-27 DIAGNOSIS — I10 Essential (primary) hypertension: Secondary | ICD-10-CM | POA: Diagnosis not present

## 2017-06-27 DIAGNOSIS — J441 Chronic obstructive pulmonary disease with (acute) exacerbation: Secondary | ICD-10-CM | POA: Diagnosis not present

## 2017-06-28 ENCOUNTER — Encounter: Payer: Self-pay | Admitting: Pulmonary Disease

## 2017-06-28 ENCOUNTER — Ambulatory Visit (INDEPENDENT_AMBULATORY_CARE_PROVIDER_SITE_OTHER): Payer: Medicare Other | Admitting: Pulmonary Disease

## 2017-06-28 VITALS — BP 128/78 | HR 65 | Ht 66.0 in | Wt 196.0 lb

## 2017-06-28 DIAGNOSIS — R0602 Shortness of breath: Secondary | ICD-10-CM | POA: Diagnosis not present

## 2017-06-28 DIAGNOSIS — J849 Interstitial pulmonary disease, unspecified: Secondary | ICD-10-CM | POA: Diagnosis not present

## 2017-06-28 LAB — PULMONARY FUNCTION TEST
DL/VA % pred: 83 %
DL/VA: 4.2 ml/min/mmHg/L
DLCO unc % pred: 54 %
DLCO unc: 14.8 ml/min/mmHg
FEF 25-75 Post: 1.98 L/sec
FEF 25-75 Pre: 2.22 L/sec
FEF2575-%Change-Post: -10 %
FEF2575-%Pred-Post: 108 %
FEF2575-%Pred-Pre: 121 %
FEV1-%Change-Post: 0 %
FEV1-%Pred-Post: 78 %
FEV1-%Pred-Pre: 78 %
FEV1-Post: 1.58 L
FEV1-Pre: 1.57 L
FEV1FVC-%Change-Post: 3 %
FEV1FVC-%Pred-Pre: 113 %
FEV6-%Change-Post: -3 %
FEV6-%Pred-Post: 70 %
FEV6-%Pred-Pre: 72 %
FEV6-Post: 1.75 L
FEV6-Pre: 1.8 L
FEV6FVC-%Pred-Post: 104 %
FEV6FVC-%Pred-Pre: 104 %
FVC-%Change-Post: -2 %
FVC-%Pred-Post: 67 %
FVC-%Pred-Pre: 69 %
FVC-Post: 1.76 L
FVC-Pre: 1.8 L
Post FEV1/FVC ratio: 90 %
Post FEV6/FVC ratio: 100 %
Pre FEV1/FVC ratio: 87 %
Pre FEV6/FVC Ratio: 100 %
RV % pred: 75 %
RV: 1.74 L
TLC % pred: 69 %
TLC: 3.71 L

## 2017-06-28 LAB — NITRIC OXIDE: Nitric Oxide: 9

## 2017-06-28 MED ORDER — ALBUTEROL SULFATE HFA 108 (90 BASE) MCG/ACT IN AERS: 2.0000 | INHALATION_SPRAY | Freq: Four times a day (QID) | RESPIRATORY_TRACT | 3 refills | Status: DC | PRN

## 2017-06-28 NOTE — Patient Instructions (Signed)
Your lung function tests do not show any evidence of COPD There is no evidence of interstitial lung disease on your CT scan.  There are small lung nodules that we will follow-up with repeat CT in 4 months time We will start you on albuterol inhaler to be used as needed Follow-up in 6 months.

## 2017-06-28 NOTE — Progress Notes (Signed)
Cheyenne Gould    [id number]    1945/08/14  Primary Care Physician:Gould, Cheyenne Saran, MD  Referring Physician: [name], MD [address] [place], [place] [address]  Chief complaint: Follow up for dyspnea  HPI: 72 year old with history of rheumatoid, psoriatic arthritis, pustular psoriasis, GERD, hypertension.  Here for evaluation of dyspnea This is been progressive over the past year.  Has only dyspnea on exertion, no symptoms at rest.  Denies any cough, sputum production, fevers, chills.  She follows with Dr. Dierdre Gould for arthritis.  She had been previously treated with [place] which had to be stopped due to side effects and Remicade which had to be stopped after she developed sepsis and pneumonia.  She also had been tried on [place] with no efficacy.  She is currently on Simponi and methotrexate.  She was evaluated by Dr. Jacinto Gould had an echo and stress test which are normal as per the patient   She has history of GERD and is on Prilosec.  Notices that dyspnea is better when she stops the Prilosec however has recurrence of heartburn when off Prilosec.  Has had a 30 pound weight gain over the past 1 year but feels that her dyspnea is not related to the weight gain.  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  Quit in 2005 Travel History: Lived in [place] for 5 years otherwise was a resident of [place].  No significant travel.  Interim history: States that dyspnea is improved.  She has been taken off Prilosec by Dr. Elnoria Gould with improvement in her breathing Denies cough, sputum production, fevers, chills.  Outpatient Encounter Medications as of 06/28/2017  Medication Sig  . acetaminophen (TYLENOL) 500 MG tablet Take 500 mg by mouth every 6 (six) hours as needed for mild pain or headache.   Marland Kitchen amLODipine  (NORVASC) 5 MG tablet Take 1 tablet (5 mg total) by mouth daily.  . Cholecalciferol (VITAMIN D3) 2000 units TABS Take by mouth.  . clindamycin (CLEOCIN) 300 MG capsule Take 600 mg by mouth. Two hours prior to dental work.  . EPIPEN 2-PAK 0.3 MG/0.3ML SOAJ injection Inject 0.3 mg into the muscle as needed (for allergic reactions).   . folic acid (FOLVITE) 1 MG tablet 2 TABLET BY MOUTH ONCE DAILY  . golimumab 2 mg/kg in sodium chloride 0.9 % Inject 2 mg/kg into the vein every 8 (eight) weeks.   Marland Kitchen levocetirizine (XYZAL) 5 MG tablet Take 5 mg by mouth at bedtime.   . methotrexate (RHEUMATREX) 2.5 MG tablet 6 TABLETS ONCE A WEEK ORALLY 90 DAYS  . metoprolol tartrate (LOPRESSOR) 25 MG tablet TAKE ONE TABLET BY MOUTH DAILY FOR HEART  . omeprazole (PRILOSEC) 40 MG capsule Take by mouth daily.  Marland Kitchen VITAMIN D, ERGOCALCIFEROL, PO Take 1 capsule by mouth once a week. OVER THE COUNTER DOSAGE UNKNOWN, ON FRIDAYS  . [DISCONTINUED] clobetasol (OLUX) 0.05 % topical foam Apply topically 2 (two) times daily.  . [DISCONTINUED] traMADol (ULTRAM) 50 MG tablet Take by mouth every 6 (six) hours as needed.   No facility-administered encounter medications on file as of 06/28/2017.     Allergies as of 06/28/2017 - Review Complete 06/28/2017  Allergen Reaction Noted  . Peanut-containing drug products Anaphylaxis 01/18/2011  . Shellfish allergy Anaphylaxis 01/15/2011  . Lyrica [pregabalin] Other (  See Comments) 12/23/2012  . Penicillins Other (See Comments) 01/15/2011  . Sulfa antibiotics Other (See Comments) 01/15/2011  . Leflunomide Rash 09/04/2014    Past Medical History:  Diagnosis Date  . Acid reflux   . AKI (acute kidney injury) (HCC) 06/07/2015  . Allergic sinusitis   . Anemia   . CAP (community acquired pneumonia) 06/07/2015  . Chest pain 01/01/2014  . Hyperlipidemia   . Hypertension   . Hypoxia   . Rheumatoid arthritis (HCC)    on Leflunomide   . Rheumatoid arthritis(714.0)    on Leflunomide     Past Surgical History:  Procedure Laterality Date  . ABDOMINAL HYSTERECTOMY  1985   Partial  . BUNIONECTOMY  2002,12/18/2006    Family History  Problem Relation Age of Onset  . Heart disease Father   . Hypertension Sister   . Lupus Brother   . Cancer Brother        Colon    Social History   Socioeconomic History  . Marital status: Divorced    Spouse name: Not on file  . Number of children: Not on file  . Years of education: Not on file  . Highest education level: Not on file  Occupational History  . Not on file  Social Needs  . Financial resource strain: Not on file  . Food insecurity:    Worry: Not on file    Inability: Not on file  . Transportation needs:    Medical: Not on file    Non-medical: Not on file  Tobacco Use  . Smoking status: Former Smoker    Packs/day: 1.00    Years: 40.00    Pack years: 40.00    Types: Cigarettes    Last attempt to quit: 02/20/2003    Years since quitting: 14.3  . Smokeless tobacco: Never Used  Substance and Sexual Activity  . Alcohol use: No  . Drug use: No  . Sexual activity: Not on file  Lifestyle  . Physical activity:    Days per week: Not on file    Minutes per session: Not on file  . Stress: Not on file  Relationships  . Social connections:    Talks on phone: Not on file    Gets together: Not on file    Attends religious service: Not on file    Active member of club or organization: Not on file    Attends meetings of clubs or organizations: Not on file    Relationship status: Not on file  . Intimate partner violence:    Fear of current or ex partner: Not on file    Emotionally abused: Not on file    Physically abused: Not on file    Forced sexual activity: Not on file  Other Topics Concern  . Not on file  Social History Narrative  . Not on file   Review of systems: Review of Systems  Constitutional: Negative for fever and chills.  HENT: Negative.   Eyes: Negative for blurred vision.  Respiratory: as  per HPI  Cardiovascular: Negative for chest pain and palpitations.  Gastrointestinal: Negative for vomiting, diarrhea, blood per rectum. Genitourinary: Negative for dysuria, urgency, frequency and hematuria.  Musculoskeletal: Negative for myalgias, back pain and joint pain.  Skin: Negative for itching and rash.  Neurological: Negative for dizziness, tremors, focal weakness, seizures and loss of consciousness.  Endo/Heme/Allergies: Negative for environmental allergies.  Psychiatric/Behavioral: Negative for depression, suicidal ideas and hallucinations.  All other systems reviewed and are negative.  Physical Exam: Blood pressure 128/78, pulse 65, height 5\' 6"  (1.676 m), weight 196 lb (88.9 kg), SpO2 94 %. Gen:      No acute distress HEENT:  EOMI, sclera anicteric Neck:     No masses; no thyromegaly Lungs:    Clear to auscultation bilaterally; normal respiratory effort CV:         Regular rate and rhythm; no murmurs Abd:      + bowel sounds; soft, non-tender; no palpable masses, no distension Ext:    No edema; adequate peripheral perfusion Skin:      Warm and dry; no rash Neuro: alert and oriented x 3 Psych: normal mood and affect  Data Reviewed: Chest x-ray 06/10/15-bilateral lung infiltrates Chest x-ray 06/25/15-resolution of right upper lobe infiltrate and left basilar pneumonia.  There is linear atelectasis at the right base with right diaphragm elevation. High-resolution CT 06/05/2017- mild atrophy, no evidence of interstitial lung disease.  Subcentimeter pulmonary nodule, coronary atherosclerosis.  Mild scarring in the right middle, lower lobes I have reviewed the images personally.   PFTs 06/28/2017 FVC 1.76 [7%), FEV1 1.58 [78%], F/F 90, TLC 69%, DLCO 54%, DLCO/VA 83% Mild to moderate diffusion defect that corrects for alveolar volume.  FENO 06/28/2017-9  Assessment:  Follow-up for dyspnea PFTs reviewed with no evidence of obstruction however there is mild airtrapping suggestive  of small airway disease There is no evidence of COPD or asthma.  We will give her an albuterol inhaler to be used as needed.  I do not believe she will require controller medication She has history of rheumatoid arthritis with no interstitial lung disease on CT.   Subcentimeter pulmonary nodule Order follow-up CT without contrast to monitor the nodules.  Coronary atherosclerosis on CT scan Already follows up with Dr. 08/28/2017, cardiology  Plan/Recommendations: - Albuterol inhaler as needed - Follow up CT for lung nodules  Cheyenne Halim MD [facility] 06/28/2017, 2:06 PM  CC: 08/28/2017, MD

## 2017-06-28 NOTE — Progress Notes (Signed)
PFT completed 06/28/17

## 2017-07-01 DIAGNOSIS — L4059 Other psoriatic arthropathy: Secondary | ICD-10-CM | POA: Diagnosis not present

## 2017-07-01 DIAGNOSIS — L401 Generalized pustular psoriasis: Secondary | ICD-10-CM | POA: Diagnosis not present

## 2017-07-01 DIAGNOSIS — M5136 Other intervertebral disc degeneration, lumbar region: Secondary | ICD-10-CM | POA: Diagnosis not present

## 2017-07-01 DIAGNOSIS — Z6831 Body mass index (BMI) 31.0-31.9, adult: Secondary | ICD-10-CM | POA: Diagnosis not present

## 2017-07-01 DIAGNOSIS — R0602 Shortness of breath: Secondary | ICD-10-CM | POA: Diagnosis not present

## 2017-07-01 DIAGNOSIS — R21 Rash and other nonspecific skin eruption: Secondary | ICD-10-CM | POA: Diagnosis not present

## 2017-07-01 DIAGNOSIS — E669 Obesity, unspecified: Secondary | ICD-10-CM | POA: Diagnosis not present

## 2017-07-01 DIAGNOSIS — M0589 Other rheumatoid arthritis with rheumatoid factor of multiple sites: Secondary | ICD-10-CM | POA: Diagnosis not present

## 2017-07-01 DIAGNOSIS — M150 Primary generalized (osteo)arthritis: Secondary | ICD-10-CM | POA: Diagnosis not present

## 2017-07-19 DIAGNOSIS — L4059 Other psoriatic arthropathy: Secondary | ICD-10-CM | POA: Diagnosis not present

## 2017-07-19 DIAGNOSIS — M0589 Other rheumatoid arthritis with rheumatoid factor of multiple sites: Secondary | ICD-10-CM | POA: Diagnosis not present

## 2017-07-31 ENCOUNTER — Telehealth: Payer: Self-pay | Admitting: Pulmonary Disease

## 2017-07-31 NOTE — Telephone Encounter (Signed)
Called patient unable to reach left message to give us a call back.

## 2017-08-01 NOTE — Telephone Encounter (Signed)
Spoke with pt. States that someone from our office called her but they did not leave a message. Advised pt that I do not see where anyone from our office called her. Asked if she needed assistance with anything from our office and she declined. Nothing further was needed.

## 2017-09-04 DIAGNOSIS — K259 Gastric ulcer, unspecified as acute or chronic, without hemorrhage or perforation: Secondary | ICD-10-CM | POA: Diagnosis not present

## 2017-09-04 DIAGNOSIS — R0602 Shortness of breath: Secondary | ICD-10-CM | POA: Diagnosis not present

## 2017-09-04 DIAGNOSIS — K219 Gastro-esophageal reflux disease without esophagitis: Secondary | ICD-10-CM | POA: Diagnosis not present

## 2017-09-09 DIAGNOSIS — L401 Generalized pustular psoriasis: Secondary | ICD-10-CM | POA: Diagnosis not present

## 2017-09-09 DIAGNOSIS — M0589 Other rheumatoid arthritis with rheumatoid factor of multiple sites: Secondary | ICD-10-CM | POA: Diagnosis not present

## 2017-09-09 DIAGNOSIS — M5136 Other intervertebral disc degeneration, lumbar region: Secondary | ICD-10-CM | POA: Diagnosis not present

## 2017-09-09 DIAGNOSIS — Z6831 Body mass index (BMI) 31.0-31.9, adult: Secondary | ICD-10-CM | POA: Diagnosis not present

## 2017-09-09 DIAGNOSIS — R0602 Shortness of breath: Secondary | ICD-10-CM | POA: Diagnosis not present

## 2017-09-09 DIAGNOSIS — L4059 Other psoriatic arthropathy: Secondary | ICD-10-CM | POA: Diagnosis not present

## 2017-09-09 DIAGNOSIS — E669 Obesity, unspecified: Secondary | ICD-10-CM | POA: Diagnosis not present

## 2017-09-09 DIAGNOSIS — M150 Primary generalized (osteo)arthritis: Secondary | ICD-10-CM | POA: Diagnosis not present

## 2017-09-09 DIAGNOSIS — R21 Rash and other nonspecific skin eruption: Secondary | ICD-10-CM | POA: Diagnosis not present

## 2017-09-13 DIAGNOSIS — M0589 Other rheumatoid arthritis with rheumatoid factor of multiple sites: Secondary | ICD-10-CM | POA: Diagnosis not present

## 2017-09-13 DIAGNOSIS — Z79899 Other long term (current) drug therapy: Secondary | ICD-10-CM | POA: Diagnosis not present

## 2017-09-25 ENCOUNTER — Ambulatory Visit
Admission: RE | Admit: 2017-09-25 | Discharge: 2017-09-25 | Disposition: A | Discharge status: Home / Self Care | Payer: Federal, State, Local not specified - PPO | Source: Ambulatory Visit | Attending: Pulmonary Disease | Admitting: Pulmonary Disease

## 2017-09-25 DIAGNOSIS — M0540 Rheumatoid myopathy with rheumatoid arthritis of unspecified site: Secondary | ICD-10-CM | POA: Diagnosis not present

## 2017-09-25 DIAGNOSIS — I10 Essential (primary) hypertension: Secondary | ICD-10-CM | POA: Diagnosis not present

## 2017-09-25 DIAGNOSIS — R918 Other nonspecific abnormal finding of lung field: Secondary | ICD-10-CM | POA: Diagnosis not present

## 2017-09-25 DIAGNOSIS — Z6831 Body mass index (BMI) 31.0-31.9, adult: Secondary | ICD-10-CM | POA: Diagnosis not present

## 2017-10-01 ENCOUNTER — Telehealth: Payer: Self-pay | Admitting: Pulmonary Disease

## 2017-10-01 DIAGNOSIS — L400 Psoriasis vulgaris: Secondary | ICD-10-CM | POA: Diagnosis not present

## 2017-10-01 DIAGNOSIS — R918 Other nonspecific abnormal finding of lung field: Secondary | ICD-10-CM

## 2017-10-01 NOTE — Telephone Encounter (Signed)
Notes recorded by Chilton Greathouse, MD on 09/30/2017 at 10:49 AM EDT Please let patient know CT shows stable lung nodule and coronary plaque.  Order follow up CT without contrast in 20 months.  ------------------------------------- Spoke with pt. She is aware of results. Nothing further was needed.

## 2017-10-02 DIAGNOSIS — R1013 Epigastric pain: Secondary | ICD-10-CM | POA: Diagnosis not present

## 2017-10-02 DIAGNOSIS — K259 Gastric ulcer, unspecified as acute or chronic, without hemorrhage or perforation: Secondary | ICD-10-CM | POA: Diagnosis not present

## 2017-10-04 DIAGNOSIS — M79602 Pain in left arm: Secondary | ICD-10-CM | POA: Diagnosis not present

## 2017-10-04 DIAGNOSIS — I10 Essential (primary) hypertension: Secondary | ICD-10-CM | POA: Diagnosis not present

## 2017-10-04 DIAGNOSIS — R0609 Other forms of dyspnea: Secondary | ICD-10-CM | POA: Diagnosis not present

## 2017-10-04 DIAGNOSIS — I2510 Atherosclerotic heart disease of native coronary artery without angina pectoris: Secondary | ICD-10-CM | POA: Diagnosis not present

## 2017-10-23 DIAGNOSIS — R0781 Pleurodynia: Secondary | ICD-10-CM | POA: Diagnosis not present

## 2017-10-23 DIAGNOSIS — R1012 Left upper quadrant pain: Secondary | ICD-10-CM | POA: Diagnosis not present

## 2017-11-07 ENCOUNTER — Encounter: Payer: Self-pay | Admitting: Pulmonary Disease

## 2017-11-07 ENCOUNTER — Ambulatory Visit (INDEPENDENT_AMBULATORY_CARE_PROVIDER_SITE_OTHER): Payer: Medicare Other | Admitting: Pulmonary Disease

## 2017-11-07 VITALS — BP 128/68 | HR 85 | Ht 66.0 in | Wt 190.4 lb

## 2017-11-07 DIAGNOSIS — R0600 Dyspnea, unspecified: Secondary | ICD-10-CM | POA: Diagnosis not present

## 2017-11-07 DIAGNOSIS — R911 Solitary pulmonary nodule: Secondary | ICD-10-CM

## 2017-11-07 DIAGNOSIS — Z79899 Other long term (current) drug therapy: Secondary | ICD-10-CM | POA: Diagnosis not present

## 2017-11-07 DIAGNOSIS — M0589 Other rheumatoid arthritis with rheumatoid factor of multiple sites: Secondary | ICD-10-CM | POA: Diagnosis not present

## 2017-11-07 NOTE — Patient Instructions (Signed)
We have ordered a follow-up CT scan for you in the year 2021 for monitoring the lung nodules I am glad that your breathing has improved Follow-up in clinic after CT scan

## 2017-11-07 NOTE — Progress Notes (Signed)
Cheyenne Gould    [id number]    1945-12-16  Primary Care Physician:Bland, Adrian Saran, MD  Referring Physician: [name], MD [address] [place], [place] [address]  Chief complaint: Follow up for dyspnea  HPI: 72 year old with history of rheumatoid, psoriatic arthritis, pustular psoriasis, GERD, hypertension.  Here for evaluation of dyspnea This is been progressive over the past year.  Has only dyspnea on exertion, no symptoms at rest.  Denies any cough, sputum production, fevers, chills.  She follows with Dr. Dierdre Forth for arthritis.  She had been previously treated with [place] which had to be stopped due to side effects and Remicade which had to be stopped after she developed sepsis and pneumonia.  She also had been tried on [place] with no efficacy.  She is currently on Simponi and methotrexate.  She was evaluated by Dr. Jacinto Halim had an echo and stress test which are normal as per the patient   She has history of GERD and is on Prilosec.  Notices that dyspnea is better when she stops the Prilosec however has recurrence of heartburn when off Prilosec.  Has had a 30 pound weight gain over the past 1 year but feels that her dyspnea is not related to the weight gain.  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  Quit in 2005 Travel History: Lived in [place] for 5 years otherwise was a resident of [place].  No significant travel.  Interim history: Breathing is doing well.  She feels that Prilosec caused the dyspnea and is doing much better after she has taken off it No new complaints today.  She is hardly using her albuterol rescue inhaler.  Outpatient Encounter Medications as of 11/07/2017  Medication Sig  . acetaminophen (TYLENOL) 500 MG tablet Take 500 mg by mouth every 6 (six) hours as needed for mild pain  or headache.   . albuterol (PROAIR HFA) 108 (90 Base) MCG/ACT inhaler Inhale 2 puffs into the lungs every 6 (six) hours as needed for wheezing or shortness of breath.  Marland Kitchen amLODipine (NORVASC) 5 MG tablet Take 1 tablet (5 mg total) by mouth daily.  . Cholecalciferol (VITAMIN D3) 2000 units TABS Take by mouth.  . clindamycin (CLEOCIN) 300 MG capsule Take 600 mg by mouth. Two hours prior to dental work.  . EPIPEN 2-PAK 0.3 MG/0.3ML SOAJ injection Inject 0.3 mg into the muscle as needed (for allergic reactions).   . folic acid (FOLVITE) 1 MG tablet 2 TABLET BY MOUTH ONCE DAILY  . golimumab 2 mg/kg in sodium chloride 0.9 % Inject 2 mg/kg into the vein every 8 (eight) weeks.   Marland Kitchen levocetirizine (XYZAL) 5 MG tablet Take 5 mg by mouth at bedtime.   . methotrexate (RHEUMATREX) 2.5 MG tablet 6 TABLETS ONCE A WEEK ORALLY 90 DAYS  . metoprolol tartrate (LOPRESSOR) 25 MG tablet TAKE ONE TABLET BY MOUTH DAILY FOR HEART  . omeprazole (PRILOSEC) 40 MG capsule Take by mouth daily.  Marland Kitchen VITAMIN D, ERGOCALCIFEROL, PO Take 1 capsule by mouth once a week. OVER THE COUNTER DOSAGE UNKNOWN, ON FRIDAYS   No facility-administered encounter medications on file as of 11/07/2017.    Physical Exam: Blood pressure 128/68, pulse 85, height 5\' 6"  (1.676 m), weight 190 lb 6.4 oz (86.4 kg), SpO2 98 %. Gen:  No acute distress HEENT:  EOMI, sclera anicteric Neck:     No masses; no thyromegaly Lungs:    Clear to auscultation bilaterally; normal respiratory effort CV:         Regular rate and rhythm; no murmurs Abd:      + bowel sounds; soft, non-tender; no palpable masses, no distension Ext:    No edema; adequate peripheral perfusion Skin:      Warm and dry; no rash Neuro: alert and oriented x 3 Psych: normal mood and affect  Data Reviewed: Imaging Chest x-ray 06/10/15-bilateral lung infiltrates Chest x-ray 06/25/15-resolution of right upper lobe infiltrate and left basilar pneumonia.  There is linear atelectasis at the right  base with right diaphragm elevation. High-resolution CT 06/05/2017- mild atrophy, no evidence of interstitial lung disease.  Subcentimeter pulmonary nodule, coronary atherosclerosis.  Mild scarring in the right middle, lower lobes CT chest 09/25/2017- able subcentimeter pulmonary nodules, coronary atherosclerosis. I have reviewed the images personally.  PFTs 06/28/2017 FVC 1.76 [7%), FEV1 1.58 [78%], F/F 90, TLC 69%, DLCO 54%, DLCO/VA 83% Mild to moderate diffusion defect that corrects for alveolar volume.  FENO 06/28/2017-9  Assessment:  Follow-up for dyspnea PFTs reviewed with no evidence of obstruction however there is mild airtrapping suggestive of small airway disease There is no evidence of COPD or asthma.   Albuterol as needed. I do not believe she will require controller medication She has history of rheumatoid arthritis with no interstitial lung disease on CT.   Subcentimeter pulmonary nodule CT scan from August 2019 reviewed with stable lung nodules are likely benign Follow-up CT ordered for the year 2021 for 2 year follow up from first CT scan  Coronary atherosclerosis on CT scan Already follows up with Dr. Jacinto Halim, cardiology Started on statin  Plan/Recommendations: - Albuterol inhaler as needed - Follow up CT for lung nodules  Chilton Greathouse MD [facility] 11/07/2017, 10:40 AM  CC: [name], MD

## 2017-11-13 DIAGNOSIS — L400 Psoriasis vulgaris: Secondary | ICD-10-CM | POA: Diagnosis not present

## 2017-11-20 DIAGNOSIS — R0781 Pleurodynia: Secondary | ICD-10-CM | POA: Diagnosis not present

## 2017-11-20 DIAGNOSIS — M79602 Pain in left arm: Secondary | ICD-10-CM | POA: Diagnosis not present

## 2017-11-20 DIAGNOSIS — I2510 Atherosclerotic heart disease of native coronary artery without angina pectoris: Secondary | ICD-10-CM | POA: Diagnosis not present

## 2017-11-20 DIAGNOSIS — R0609 Other forms of dyspnea: Secondary | ICD-10-CM | POA: Diagnosis not present

## 2017-11-20 DIAGNOSIS — R1012 Left upper quadrant pain: Secondary | ICD-10-CM | POA: Diagnosis not present

## 2017-11-20 DIAGNOSIS — I10 Essential (primary) hypertension: Secondary | ICD-10-CM | POA: Diagnosis not present

## 2017-11-21 DIAGNOSIS — E785 Hyperlipidemia, unspecified: Secondary | ICD-10-CM | POA: Diagnosis not present

## 2017-11-21 DIAGNOSIS — I2510 Atherosclerotic heart disease of native coronary artery without angina pectoris: Secondary | ICD-10-CM | POA: Diagnosis not present

## 2017-11-21 DIAGNOSIS — R0602 Shortness of breath: Secondary | ICD-10-CM | POA: Diagnosis not present

## 2017-12-02 ENCOUNTER — Ambulatory Visit (INDEPENDENT_AMBULATORY_CARE_PROVIDER_SITE_OTHER): Payer: Medicare Other

## 2017-12-02 DIAGNOSIS — Z23 Encounter for immunization: Secondary | ICD-10-CM

## 2017-12-10 DIAGNOSIS — E669 Obesity, unspecified: Secondary | ICD-10-CM | POA: Diagnosis not present

## 2017-12-10 DIAGNOSIS — M5136 Other intervertebral disc degeneration, lumbar region: Secondary | ICD-10-CM | POA: Diagnosis not present

## 2017-12-10 DIAGNOSIS — M150 Primary generalized (osteo)arthritis: Secondary | ICD-10-CM | POA: Diagnosis not present

## 2017-12-10 DIAGNOSIS — Z6831 Body mass index (BMI) 31.0-31.9, adult: Secondary | ICD-10-CM | POA: Diagnosis not present

## 2017-12-10 DIAGNOSIS — M0589 Other rheumatoid arthritis with rheumatoid factor of multiple sites: Secondary | ICD-10-CM | POA: Diagnosis not present

## 2017-12-10 DIAGNOSIS — L4059 Other psoriatic arthropathy: Secondary | ICD-10-CM | POA: Diagnosis not present

## 2017-12-10 DIAGNOSIS — R0602 Shortness of breath: Secondary | ICD-10-CM | POA: Diagnosis not present

## 2017-12-10 DIAGNOSIS — L401 Generalized pustular psoriasis: Secondary | ICD-10-CM | POA: Diagnosis not present

## 2017-12-12 DIAGNOSIS — Z6830 Body mass index (BMI) 30.0-30.9, adult: Secondary | ICD-10-CM | POA: Diagnosis not present

## 2017-12-12 DIAGNOSIS — J069 Acute upper respiratory infection, unspecified: Secondary | ICD-10-CM | POA: Diagnosis not present

## 2017-12-12 DIAGNOSIS — F064 Anxiety disorder due to known physiological condition: Secondary | ICD-10-CM | POA: Diagnosis not present

## 2017-12-18 DIAGNOSIS — Z0000 Encounter for general adult medical examination without abnormal findings: Secondary | ICD-10-CM | POA: Diagnosis not present

## 2018-01-02 DIAGNOSIS — M0589 Other rheumatoid arthritis with rheumatoid factor of multiple sites: Secondary | ICD-10-CM | POA: Diagnosis not present

## 2018-01-02 DIAGNOSIS — Z79899 Other long term (current) drug therapy: Secondary | ICD-10-CM | POA: Diagnosis not present

## 2018-01-08 DIAGNOSIS — Z6833 Body mass index (BMI) 33.0-33.9, adult: Secondary | ICD-10-CM | POA: Diagnosis not present

## 2018-01-08 DIAGNOSIS — J321 Chronic frontal sinusitis: Secondary | ICD-10-CM | POA: Diagnosis not present

## 2018-01-29 DIAGNOSIS — Z6833 Body mass index (BMI) 33.0-33.9, adult: Secondary | ICD-10-CM | POA: Diagnosis not present

## 2018-01-29 DIAGNOSIS — I10 Essential (primary) hypertension: Secondary | ICD-10-CM | POA: Diagnosis not present

## 2018-01-29 DIAGNOSIS — E785 Hyperlipidemia, unspecified: Secondary | ICD-10-CM | POA: Diagnosis not present

## 2018-02-18 DIAGNOSIS — L400 Psoriasis vulgaris: Secondary | ICD-10-CM | POA: Diagnosis not present

## 2018-02-27 DIAGNOSIS — M0589 Other rheumatoid arthritis with rheumatoid factor of multiple sites: Secondary | ICD-10-CM | POA: Diagnosis not present

## 2018-02-27 DIAGNOSIS — Z79899 Other long term (current) drug therapy: Secondary | ICD-10-CM | POA: Diagnosis not present

## 2018-03-10 DIAGNOSIS — J3089 Other allergic rhinitis: Secondary | ICD-10-CM | POA: Diagnosis not present

## 2018-03-10 DIAGNOSIS — Z91010 Allergy to peanuts: Secondary | ICD-10-CM | POA: Diagnosis not present

## 2018-03-10 DIAGNOSIS — R21 Rash and other nonspecific skin eruption: Secondary | ICD-10-CM | POA: Diagnosis not present

## 2018-03-10 DIAGNOSIS — Z91013 Allergy to seafood: Secondary | ICD-10-CM | POA: Diagnosis not present

## 2018-03-13 DIAGNOSIS — M150 Primary generalized (osteo)arthritis: Secondary | ICD-10-CM | POA: Diagnosis not present

## 2018-03-13 DIAGNOSIS — M0589 Other rheumatoid arthritis with rheumatoid factor of multiple sites: Secondary | ICD-10-CM | POA: Diagnosis not present

## 2018-03-13 DIAGNOSIS — R0602 Shortness of breath: Secondary | ICD-10-CM | POA: Diagnosis not present

## 2018-03-13 DIAGNOSIS — L401 Generalized pustular psoriasis: Secondary | ICD-10-CM | POA: Diagnosis not present

## 2018-03-13 DIAGNOSIS — E669 Obesity, unspecified: Secondary | ICD-10-CM | POA: Diagnosis not present

## 2018-03-13 DIAGNOSIS — Z6830 Body mass index (BMI) 30.0-30.9, adult: Secondary | ICD-10-CM | POA: Diagnosis not present

## 2018-03-13 DIAGNOSIS — L4059 Other psoriatic arthropathy: Secondary | ICD-10-CM | POA: Diagnosis not present

## 2018-03-13 DIAGNOSIS — M5136 Other intervertebral disc degeneration, lumbar region: Secondary | ICD-10-CM | POA: Diagnosis not present

## 2018-03-13 DIAGNOSIS — M25512 Pain in left shoulder: Secondary | ICD-10-CM | POA: Diagnosis not present

## 2018-03-19 DIAGNOSIS — K219 Gastro-esophageal reflux disease without esophagitis: Secondary | ICD-10-CM | POA: Diagnosis not present

## 2018-03-26 DIAGNOSIS — H43813 Vitreous degeneration, bilateral: Secondary | ICD-10-CM | POA: Diagnosis not present

## 2018-03-26 DIAGNOSIS — H25813 Combined forms of age-related cataract, bilateral: Secondary | ICD-10-CM | POA: Diagnosis not present

## 2018-03-26 DIAGNOSIS — H52223 Regular astigmatism, bilateral: Secondary | ICD-10-CM | POA: Diagnosis not present

## 2018-03-26 DIAGNOSIS — H524 Presbyopia: Secondary | ICD-10-CM | POA: Diagnosis not present

## 2018-04-04 DIAGNOSIS — R21 Rash and other nonspecific skin eruption: Secondary | ICD-10-CM | POA: Diagnosis not present

## 2018-04-04 DIAGNOSIS — B86 Scabies: Secondary | ICD-10-CM | POA: Diagnosis not present

## 2018-04-16 DIAGNOSIS — K219 Gastro-esophageal reflux disease without esophagitis: Secondary | ICD-10-CM | POA: Diagnosis not present

## 2018-04-24 DIAGNOSIS — M0589 Other rheumatoid arthritis with rheumatoid factor of multiple sites: Secondary | ICD-10-CM | POA: Diagnosis not present

## 2018-04-24 DIAGNOSIS — Z79899 Other long term (current) drug therapy: Secondary | ICD-10-CM | POA: Diagnosis not present

## 2018-04-26 ENCOUNTER — Ambulatory Visit (HOSPITAL_COMMUNITY)
Admission: EM | Admit: 2018-04-26 | Discharge: 2018-04-26 | Disposition: A | Discharge status: Home / Self Care | Payer: Medicare Other | Attending: Family Medicine | Admitting: Family Medicine

## 2018-04-26 ENCOUNTER — Encounter (HOSPITAL_COMMUNITY): Payer: Self-pay | Admitting: Family Medicine

## 2018-04-26 DIAGNOSIS — R21 Rash and other nonspecific skin eruption: Secondary | ICD-10-CM | POA: Diagnosis not present

## 2018-04-26 MED ORDER — IVERMECTIN 3 MG PO TABS: 12.0000 mg | ORAL_TABLET | Freq: Once | ORAL | 0 refills | Status: AC

## 2018-04-26 MED ORDER — PREDNISONE 20 MG PO TABS: ORAL_TABLET | ORAL | 0 refills | Status: DC

## 2018-04-26 NOTE — ED Provider Notes (Signed)
[facility]    CSN: [id number] Arrival date & time: 04/26/18  1534     History   Chief Complaint Chief Complaint  Patient presents with  . Rash    all over body     HPI Cheyenne Gould is a 73 y.o. female.   Pt present a rash that has spread on her legs, arms and hands. Pt states the rash is itching with some burning.  The rash appeared 2 days ago.    Patient reports having cleaned out an elderly man's home.  She says her sister was helping her and her sister was diagnosed with scabies.     Past Medical History:  Diagnosis Date  . Acid reflux   . AKI (acute kidney injury) (HCC) 06/07/2015  . Allergic sinusitis   . Anemia   . CAP (community acquired pneumonia) 06/07/2015  . Chest pain 01/01/2014  . Hyperlipidemia   . Hypertension   . Hypoxia   . Rheumatoid arthritis (HCC)    on Leflunomide   . Rheumatoid arthritis(714.0)    on Leflunomide    Patient Active Problem List   Diagnosis Date Noted  . Palpitations 08/16/2016  . Hypoxia   . Sepsis due to pneumonia (HCC) 06/08/2015  . Hypotension 06/08/2015  . CAP (community acquired pneumonia) 06/07/2015  . AKI (acute kidney injury) (HCC) 06/07/2015  . Chest pain 01/01/2014  . Hypertension   . Rheumatoid arthritis (HCC)   . Hyperlipidemia   . Anemia     Past Surgical History:  Procedure Laterality Date  . ABDOMINAL HYSTERECTOMY  1985   Partial  . BUNIONECTOMY  2002,12/18/2006    OB History   No obstetric history on file.      Home Medications    Prior to Admission medications   Medication Sig Start Date End Date Taking? Authorizing Provider  acetaminophen (TYLENOL) 500 MG tablet Take 500 mg by mouth every 6 (six) hours as needed for mild pain or headache.     [provider]  albuterol (PROAIR HFA) 108 (90 Base) MCG/ACT inhaler Inhale 2 puffs into the lungs every 6 (six) hours as needed for wheezing or shortness of breath. 06/28/17   Mannam, Colbert CoyerPraveen, MD  amLODipine (NORVASC) 5 MG  tablet Take 1 tablet (5 mg total) by mouth daily. 06/14/15   Zannie CoveJoseph, Preetha, MD  Cholecalciferol (VITAMIN D3) 2000 units TABS Take by mouth.    [provider]  clindamycin (CLEOCIN) 300 MG capsule Take 600 mg by mouth. Two hours prior to dental work.    [provider]  EPIPEN 2-PAK 0.3 MG/0.3ML SOAJ injection Inject 0.3 mg into the muscle as needed (for allergic reactions).  06/14/14   [provider]  folic acid (FOLVITE) 1 MG tablet 2 TABLET BY MOUTH ONCE DAILY 08/13/14   [provider]  golimumab 2 mg/kg in sodium chloride 0.9 % Inject 2 mg/kg into the vein every 8 (eight) weeks.     [provider]  ivermectin (STROMECTOL) 3 MG TABS tablet Take 4 tablets (12 mg total) by mouth once for 1 dose. 04/26/18 04/26/18  Elvina SidleLauenstein, Sheilyn Boehlke, MD  levocetirizine (XYZAL) 5 MG tablet Take 5 mg by mouth at bedtime.  05/21/12   [provider]  methotrexate (RHEUMATREX) 2.5 MG tablet 6 TABLETS ONCE A WEEK ORALLY 90 DAYS 04/19/17   [provider]  metoprolol tartrate (LOPRESSOR) 25 MG tablet TAKE ONE TABLET BY MOUTH DAILY FOR HEART 03/29/17   [provider]  omeprazole (PRILOSEC) 40 MG  capsule Take by mouth daily. 03/29/17   [provider]  predniSONE (DELTASONE) 20 MG tablet Two daily with food 04/26/18   Elvina Sidle, MD  VITAMIN D, ERGOCALCIFEROL, PO Take 1 capsule by mouth once a week. OVER THE COUNTER DOSAGE UNKNOWN, ON FRIDAYS    [provider]    Family History Family History  Problem Relation Age of Onset  . Heart disease Father   . Hypertension Sister   . Lupus Brother   . Cancer Brother        Colon    Social History Social History   Tobacco Use  . Smoking status: Former Smoker    Packs/day: 1.00    Years: 40.00    Pack years: 40.00    Types: Cigarettes    Last attempt to quit: 02/20/2003    Years since quitting: 15.1  . Smokeless tobacco: Never Used  Substance Use Topics  . Alcohol use: No  . Drug  use: No     Allergies   Peanut-containing drug products; Shellfish allergy; Allegra [fexofenadine]; Lyrica [pregabalin]; Penicillins; Sulfa antibiotics; and Leflunomide   Review of Systems Review of Systems   Physical Exam Triage Vital Signs ED Triage Vitals  Enc Vitals Group     BP 04/26/18 1606 112/87     Pulse Rate 04/26/18 1606 88     Resp 04/26/18 1606 16     Temp 04/26/18 1606 97.6 F (36.4 C)     Temp Source 04/26/18 1606 Oral     SpO2 04/26/18 1606 100 %     Weight --      Height --      Head Circumference --      Peak Flow --      Pain Score 04/26/18 1607 0     Pain Loc --      Pain Edu? --      Excl. in GC? --    No data found.  Updated Vital Signs BP 112/87 (BP Location: Right Arm)   Pulse 88   Temp 97.6 F (36.4 C) (Oral)   Resp 16   SpO2 100%    Physical Exam Vitals signs and nursing note reviewed.  Constitutional:      Appearance: Normal appearance.  HENT:     Head: Normocephalic and atraumatic.     Mouth/Throat:     Mouth: Mucous membranes are moist.  Eyes:     Conjunctiva/sclera: Conjunctivae normal.  Pulmonary:     Effort: Pulmonary effort is normal.  Skin:    Findings: Rash present.     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  Patient has a diffuse erythematous rash on her legs and arms.  She also has peeling excoriated areas between her fingers.  Neurological:     General: No focal deficit present.     Mental Status: She is alert and oriented to person, place, and time.  Psychiatric:        Mood and Affect: Mood normal.        Thought Content: Thought content normal.      UC Treatments / Results  Labs (all labs ordered are listed, but only abnormal results are displayed) Labs Reviewed - No data to display  EKG None  Radiology No results found.  Procedures Procedures (including critical care time)  Medications Ordered in UC Medications - No data to display  Initial Impression / Assessment and Plan  / UC Course  I have reviewed the triage vital signs and the  nursing notes.  Pertinent labs & imaging results that were available during my care of the patient were reviewed by me and considered in my medical decision making (see chart for details).    Final Clinical Impressions(s) / UC Diagnoses   Final diagnoses:  Rash   Discharge Instructions   None    ED Prescriptions    Medication Sig Dispense Auth. Provider   ivermectin (STROMECTOL) 3 MG TABS tablet Take 4 tablets (12 mg total) by mouth once for 1 dose. 4 tablet Elvina SidleLauenstein, Mackenzye Mackel, MD   predniSONE (DELTASONE) 20 MG tablet Two daily with food 10 tablet Elvina SidleLauenstein, Marysue Fait, MD     Controlled Substance Prescriptions [place] Controlled Substance Registry consulted? Not Applicable   Elvina SidleLauenstein, Onna Nodal, MD 04/26/18 [phone]

## 2018-04-26 NOTE — ED Triage Notes (Signed)
Pt present a rash that has spread on her legs, arms and hands. Pt states the rash is itching with some burning.  The rash appeared 2 days ago.

## 2018-05-04 ENCOUNTER — Ambulatory Visit (HOSPITAL_COMMUNITY)
Admission: EM | Admit: 2018-05-04 | Discharge: 2018-05-04 | Disposition: A | Discharge status: Home / Self Care | Payer: Medicare Other | Attending: Family Medicine | Admitting: Family Medicine

## 2018-05-04 ENCOUNTER — Other Ambulatory Visit: Payer: Self-pay

## 2018-05-04 ENCOUNTER — Encounter (HOSPITAL_COMMUNITY): Payer: Self-pay | Admitting: Emergency Medicine

## 2018-05-04 DIAGNOSIS — N390 Urinary tract infection, site not specified: Secondary | ICD-10-CM | POA: Insufficient documentation

## 2018-05-04 DIAGNOSIS — I10 Essential (primary) hypertension: Secondary | ICD-10-CM

## 2018-05-04 LAB — POCT URINALYSIS DIP (DEVICE)
Bilirubin Urine: NEGATIVE
Glucose, UA: NEGATIVE mg/dL
Ketones, ur: NEGATIVE mg/dL
Nitrite: NEGATIVE
Protein, ur: 100 mg/dL — AB
Specific Gravity, Urine: 1.025 (ref 1.005–1.030)
Urobilinogen, UA: 0.2 mg/dL (ref 0.0–1.0)
pH: 5.5 (ref 5.0–8.0)

## 2018-05-04 MED ORDER — TRIAMCINOLONE ACETONIDE 0.1 % EX CREA: 1.0000 "application " | TOPICAL_CREAM | Freq: Two times a day (BID) | CUTANEOUS | 0 refills | Status: DC

## 2018-05-04 MED ORDER — CEPHALEXIN 500 MG PO CAPS
500.0000 mg | ORAL_CAPSULE | Freq: Two times a day (BID) | ORAL | 0 refills | Status: AC
Start: 2018-05-04 — End: 2018-05-11

## 2018-05-04 MED ORDER — PERMETHRIN 5 % EX CREA: TOPICAL_CREAM | CUTANEOUS | 1 refills | Status: DC

## 2018-05-04 MED ORDER — CEPHALEXIN 500 MG PO CAPS: 500.0000 mg | ORAL_CAPSULE | Freq: Two times a day (BID) | ORAL | 0 refills | Status: DC

## 2018-05-04 NOTE — ED Provider Notes (Signed)
[facility]    CSN: [id number] Arrival date & time: 05/04/18  1740     History   Chief Complaint Chief Complaint  Patient presents with  . Urinary Urgency    HPI Cheyenne Gould is a 73 y.o. female.   Patient is a 73 year old female with past medical history of acid reflux, AKI, allergic sinusitis, COPD, chest pain, hyperlipidemia, hypertension, rheumatoid arthritis.  She presents with approximately 3 days of urinary incontinence, urinary pressure and dysuria.  Symptoms have been constant and remained the same.  She has not done anything to treat the symptoms.  Denies any associated flank pain,abdiminal pain,  back pain, fevers, chills, night sweats. No hx of kidney stones. No N,V,D.   ROS per HPI      Past Medical History:  Diagnosis Date  . Acid reflux   . AKI (acute kidney injury) (HCC) 06/07/2015  . Allergic sinusitis   . Anemia   . CAP (community acquired pneumonia) 06/07/2015  . Chest pain 01/01/2014  . Hyperlipidemia   . Hypertension   . Hypoxia   . Rheumatoid arthritis (HCC)    on Leflunomide   . Rheumatoid arthritis(714.0)    on Leflunomide    Patient Active Problem List   Diagnosis Date Noted  . Palpitations 08/16/2016  . Hypoxia   . Sepsis due to pneumonia (HCC) 06/08/2015  . Hypotension 06/08/2015  . CAP (community acquired pneumonia) 06/07/2015  . AKI (acute kidney injury) (HCC) 06/07/2015  . Chest pain 01/01/2014  . Hypertension   . Rheumatoid arthritis (HCC)   . Hyperlipidemia   . Anemia     Past Surgical History:  Procedure Laterality Date  . ABDOMINAL HYSTERECTOMY  1985   Partial  . BUNIONECTOMY  2002,12/18/2006    OB History   No obstetric history on file.      Home Medications    Prior to Admission medications   Medication Sig Start Date End Date Taking? Authorizing Provider  amLODIPine-Valsartan-HCTZ 5-160-12.5 MG TABS Take by mouth.   Yes [provider]  Cholecalciferol (VITAMIN D3) 2000 units TABS Take  by mouth.   Yes [provider]  clotrimazole-betamethasone (LOTRISONE) cream Apply 1 application topically 2 (two) times daily.   Yes [provider]  folic acid (FOLVITE) 1 MG tablet 2 TABLET BY MOUTH ONCE DAILY 08/13/14  Yes [provider]  levocetirizine (XYZAL) 5 MG tablet Take 5 mg by mouth at bedtime.  05/21/12  Yes [provider]  methotrexate (RHEUMATREX) 2.5 MG tablet 6 TABLETS ONCE A WEEK ORALLY 90 DAYS 04/19/17  Yes [provider]  metoprolol tartrate (LOPRESSOR) 25 MG tablet TAKE ONE TABLET BY MOUTH DAILY FOR HEART 03/29/17  Yes [provider]  ranitidine (ZANTAC) 150 MG tablet Take 150 mg by mouth 2 (two) times daily.   Yes [provider]  valACYclovir (VALTREX) 500 MG tablet Take 500 mg by mouth 2 (two) times daily.   Yes [provider]  acetaminophen (TYLENOL) 500 MG tablet Take 500 mg by mouth every 6 (six) hours as needed for mild pain or headache.     [provider]  albuterol (PROAIR HFA) 108 (90 Base) MCG/ACT inhaler Inhale 2 puffs into the lungs every 6 (six) hours as needed for wheezing or shortness of breath. 06/28/17   Mannam, Colbert CoyerPraveen, MD  cephALEXin (KEFLEX) 500 MG capsule Take 1 capsule (500 mg total) by mouth 2 (two) times daily for 7 days. 05/04/18 05/11/18  Janace ArisBast, Samayra Hebel A, NP  clindamycin (CLEOCIN)  300 MG capsule Take 600 mg by mouth. Two hours prior to dental work.    [provider]  EPIPEN 2-PAK 0.3 MG/0.3ML SOAJ injection Inject 0.3 mg into the muscle as needed (for allergic reactions).  06/14/14   [provider]  golimumab 2 mg/kg in sodium chloride 0.9 % Inject 2 mg/kg into the vein every 8 (eight) weeks.     [provider]  permethrin (ELIMITE) 5 % cream Apply to affected area once 05/04/18   Dahlia ByesBast, Antion Andres A, NP  triamcinolone cream (KENALOG) 0.1 % Apply 1 application topically 2 (two) times daily. 05/04/18   Janace ArisBast, Mahek Schlesinger A, NP    Family History Family History   Problem Relation Age of Onset  . Heart disease Father   . Hypertension Sister   . Lupus Brother   . Cancer Brother        Colon    Social History Social History   Tobacco Use  . Smoking status: Former Smoker    Packs/day: 1.00    Years: 40.00    Pack years: 40.00    Types: Cigarettes    Last attempt to quit: 02/20/2003    Years since quitting: 15.2  . Smokeless tobacco: Never Used  Substance Use Topics  . Alcohol use: No  . Drug use: No     Allergies   Peanut-containing drug products; Shellfish allergy; Allegra [fexofenadine]; Lyrica [pregabalin]; Penicillins; Sulfa antibiotics; and Leflunomide   Review of Systems Review of Systems   Physical Exam Triage Vital Signs ED Triage Vitals  Enc Vitals Group     BP 05/04/18 1749 (!) 151/79     Pulse Rate 05/04/18 1749 74     Resp 05/04/18 1749 16     Temp 05/04/18 1749 98.4 F (36.9 C)     Temp Source 05/04/18 1749 Oral     SpO2 05/04/18 1749 97 %     Weight --      Height --      Head Circumference --      Peak Flow --      Pain Score 05/04/18 1750 0     Pain Loc --      Pain Edu? --      Excl. in GC? --    No data found.  Updated Vital Signs BP (!) 151/79 (BP Location: Right Arm)   Pulse 74   Temp 98.4 F (36.9 C) (Oral)   Resp 16   SpO2 97%   Visual Acuity Right Eye Distance:   Left Eye Distance:   Bilateral Distance:    Right Eye Near:   Left Eye Near:    Bilateral Near:     Physical Exam Vitals signs and nursing note reviewed.  Constitutional:      General: She is not in acute distress.    Appearance: Normal appearance. She is not ill-appearing, toxic-appearing or diaphoretic.  HENT:     Head: Normocephalic.     Nose: Nose normal.     Mouth/Throat:     Pharynx: Oropharynx is clear.  Eyes:     Conjunctiva/sclera: Conjunctivae normal.  Neck:     Musculoskeletal: Normal range of motion.  Pulmonary:     Effort: Pulmonary effort is normal.  Abdominal:     Palpations: Abdomen is soft.      Tenderness: There is no abdominal tenderness.  Musculoskeletal: Normal range of motion.  Skin:    General: Skin is warm and dry.     Findings: Rash present.  Neurological:  Mental Status: She is alert.  Psychiatric:        Mood and Affect: Mood normal.      UC Treatments / Results  Labs (all labs ordered are listed, but only abnormal results are displayed) Labs Reviewed  POCT URINALYSIS DIP (DEVICE) - Abnormal; Notable for the following components:      Result Value   Hgb urine dipstick LARGE (*)    Protein, ur 100 (*)    Leukocytes,Ua LARGE (*)    All other components within normal limits  URINE CULTURE    EKG None  Radiology No results found.  Procedures Procedures (including critical care time)  Medications Ordered in UC Medications - No data to display  Initial Impression / Assessment and Plan / UC Course  I have reviewed the triage vital signs and the nursing notes.  Pertinent labs & imaging results that were available during my care of the patient were reviewed by me and considered in my medical decision making (see chart for details).     Pt with UTI symptoms Urine with large lueks and blood Consistent with UTI Sending for culture.  Will treat with keflex Refilling her scabies meds as requested and steroid cream for rash she was previously treated for.  Follow up as needed for continued or worsening symptoms    Final Clinical Impressions(s) / UC Diagnoses   Final diagnoses:  Acute lower urinary tract infection     Discharge Instructions     Your urine had a lot of bacteria and blood which is consistent with a lower urinary tract infection We will treat your symptoms with Keflex twice a day for 7 days Make sure you are drinking plenty of water and staying hydrated We will send your urine for culture and call you with any changes that need to be made Follow up as needed for continued or worsening symptoms     ED Prescriptions     Medication Sig Dispense Auth. Provider   cephALEXin (KEFLEX) 500 MG capsule  (Status: Discontinued) Take 1 capsule (500 mg total) by mouth 2 (two) times daily for 7 days. 14 capsule Niema Carrara A, NP   permethrin (ELIMITE) 5 % cream  (Status: Discontinued) Apply to affected area once 60 g Ronith Berti A, NP   triamcinolone cream (KENALOG) 0.1 %  (Status: Discontinued) Apply 1 application topically 2 (two) times daily. 30 g Baani Bober A, NP   cephALEXin (KEFLEX) 500 MG capsule Take 1 capsule (500 mg total) by mouth 2 (two) times daily for 7 days. 14 capsule Daivion Pape A, NP   permethrin (ELIMITE) 5 % cream Apply to affected area once 60 g Jaliya Siegmann A, NP   triamcinolone cream (KENALOG) 0.1 % Apply 1 application topically 2 (two) times daily. 30 g Janace Aris, NP     Controlled Substance Prescriptions [place] Controlled Substance Registry consulted? no   Janace Aris, NP 05/05/18 1546

## 2018-05-04 NOTE — Discharge Instructions (Addendum)
Your urine had a lot of bacteria and blood which is consistent with a lower urinary tract infection We will treat your symptoms with Keflex twice a day for 7 days Make sure you are drinking plenty of water and staying hydrated We will send your urine for culture and call you with any changes that need to be made Follow up as needed for continued or worsening symptoms

## 2018-05-04 NOTE — ED Notes (Signed)
Patient unable to void at this time

## 2018-05-04 NOTE — ED Triage Notes (Signed)
Pt presents to [facility] for assessment of loss of urinary control with urinary pressure x 3 today.  C/o 1 episode of burning with urination.  Denies any discharge.

## 2018-05-06 ENCOUNTER — Telehealth (HOSPITAL_COMMUNITY): Payer: Self-pay | Admitting: Emergency Medicine

## 2018-05-06 LAB — URINE CULTURE: Culture: 80000 — AB

## 2018-05-06 NOTE — Telephone Encounter (Signed)
Urine culture was positive for ESCHERICHIA COLI and was given keflex  at urgent care visit. Pt contacted and made aware, educated on completing antibiotic and to follow up if symptoms are persistent. Verbalized understanding.    

## 2018-05-16 ENCOUNTER — Telehealth (HOSPITAL_COMMUNITY): Payer: Self-pay | Admitting: Emergency Medicine

## 2018-05-16 MED ORDER — IVERMECTIN 3 MG PO TABS: 12.0000 mg | ORAL_TABLET | Freq: Once | ORAL | 0 refills | Status: AC

## 2018-05-16 NOTE — Telephone Encounter (Signed)
Patient called asking about refill of Ivermectin. Okay to refill per Dahlia Byes NP.

## 2018-05-28 DIAGNOSIS — B86 Scabies: Secondary | ICD-10-CM | POA: Diagnosis not present

## 2018-06-06 DIAGNOSIS — I10 Essential (primary) hypertension: Secondary | ICD-10-CM | POA: Diagnosis not present

## 2018-06-06 DIAGNOSIS — J441 Chronic obstructive pulmonary disease with (acute) exacerbation: Secondary | ICD-10-CM | POA: Diagnosis not present

## 2018-06-06 DIAGNOSIS — B86 Scabies: Secondary | ICD-10-CM | POA: Diagnosis not present

## 2018-06-19 DIAGNOSIS — Z79899 Other long term (current) drug therapy: Secondary | ICD-10-CM | POA: Diagnosis not present

## 2018-06-19 DIAGNOSIS — M0589 Other rheumatoid arthritis with rheumatoid factor of multiple sites: Secondary | ICD-10-CM | POA: Diagnosis not present

## 2018-06-20 DIAGNOSIS — J441 Chronic obstructive pulmonary disease with (acute) exacerbation: Secondary | ICD-10-CM | POA: Diagnosis not present

## 2018-06-20 DIAGNOSIS — I10 Essential (primary) hypertension: Secondary | ICD-10-CM | POA: Diagnosis not present

## 2018-06-20 DIAGNOSIS — B86 Scabies: Secondary | ICD-10-CM | POA: Diagnosis not present

## 2018-06-24 DIAGNOSIS — L4059 Other psoriatic arthropathy: Secondary | ICD-10-CM | POA: Diagnosis not present

## 2018-06-24 DIAGNOSIS — M0589 Other rheumatoid arthritis with rheumatoid factor of multiple sites: Secondary | ICD-10-CM | POA: Diagnosis not present

## 2018-06-24 DIAGNOSIS — M150 Primary generalized (osteo)arthritis: Secondary | ICD-10-CM | POA: Diagnosis not present

## 2018-06-24 DIAGNOSIS — Z6830 Body mass index (BMI) 30.0-30.9, adult: Secondary | ICD-10-CM | POA: Diagnosis not present

## 2018-06-24 DIAGNOSIS — M25512 Pain in left shoulder: Secondary | ICD-10-CM | POA: Diagnosis not present

## 2018-06-24 DIAGNOSIS — M5136 Other intervertebral disc degeneration, lumbar region: Secondary | ICD-10-CM | POA: Diagnosis not present

## 2018-06-24 DIAGNOSIS — E669 Obesity, unspecified: Secondary | ICD-10-CM | POA: Diagnosis not present

## 2018-06-24 DIAGNOSIS — R0602 Shortness of breath: Secondary | ICD-10-CM | POA: Diagnosis not present

## 2018-06-24 DIAGNOSIS — L401 Generalized pustular psoriasis: Secondary | ICD-10-CM | POA: Diagnosis not present

## 2018-08-01 DIAGNOSIS — I10 Essential (primary) hypertension: Secondary | ICD-10-CM | POA: Diagnosis not present

## 2018-08-12 ENCOUNTER — Other Ambulatory Visit: Payer: Self-pay | Admitting: Internal Medicine

## 2018-08-12 DIAGNOSIS — Z20822 Contact with and (suspected) exposure to covid-19: Secondary | ICD-10-CM

## 2018-08-14 DIAGNOSIS — M0589 Other rheumatoid arthritis with rheumatoid factor of multiple sites: Secondary | ICD-10-CM | POA: Diagnosis not present

## 2018-08-16 LAB — NOVEL CORONAVIRUS, NAA: SARS-CoV-2, NAA: NOT DETECTED

## 2018-08-19 DIAGNOSIS — L403 Pustulosis palmaris et plantaris: Secondary | ICD-10-CM | POA: Diagnosis not present

## 2018-09-18 ENCOUNTER — Other Ambulatory Visit: Payer: Self-pay

## 2018-09-19 DIAGNOSIS — L400 Psoriasis vulgaris: Secondary | ICD-10-CM | POA: Diagnosis not present

## 2018-09-19 DIAGNOSIS — L401 Generalized pustular psoriasis: Secondary | ICD-10-CM | POA: Diagnosis not present

## 2018-09-19 DIAGNOSIS — Z79899 Other long term (current) drug therapy: Secondary | ICD-10-CM | POA: Diagnosis not present

## 2018-10-01 DIAGNOSIS — M25512 Pain in left shoulder: Secondary | ICD-10-CM | POA: Diagnosis not present

## 2018-10-01 DIAGNOSIS — M5136 Other intervertebral disc degeneration, lumbar region: Secondary | ICD-10-CM | POA: Diagnosis not present

## 2018-10-01 DIAGNOSIS — R0602 Shortness of breath: Secondary | ICD-10-CM | POA: Diagnosis not present

## 2018-10-01 DIAGNOSIS — L401 Generalized pustular psoriasis: Secondary | ICD-10-CM | POA: Diagnosis not present

## 2018-10-01 DIAGNOSIS — M0589 Other rheumatoid arthritis with rheumatoid factor of multiple sites: Secondary | ICD-10-CM | POA: Diagnosis not present

## 2018-10-01 DIAGNOSIS — Z6831 Body mass index (BMI) 31.0-31.9, adult: Secondary | ICD-10-CM | POA: Diagnosis not present

## 2018-10-01 DIAGNOSIS — L4059 Other psoriatic arthropathy: Secondary | ICD-10-CM | POA: Diagnosis not present

## 2018-10-01 DIAGNOSIS — M150 Primary generalized (osteo)arthritis: Secondary | ICD-10-CM | POA: Diagnosis not present

## 2018-10-01 DIAGNOSIS — E669 Obesity, unspecified: Secondary | ICD-10-CM | POA: Diagnosis not present

## 2018-10-09 DIAGNOSIS — M0589 Other rheumatoid arthritis with rheumatoid factor of multiple sites: Secondary | ICD-10-CM | POA: Diagnosis not present

## 2018-10-10 DIAGNOSIS — M109 Gout, unspecified: Secondary | ICD-10-CM | POA: Diagnosis not present

## 2018-10-10 DIAGNOSIS — F064 Anxiety disorder due to known physiological condition: Secondary | ICD-10-CM | POA: Diagnosis not present

## 2018-10-10 DIAGNOSIS — Z6833 Body mass index (BMI) 33.0-33.9, adult: Secondary | ICD-10-CM | POA: Diagnosis not present

## 2018-10-10 DIAGNOSIS — I10 Essential (primary) hypertension: Secondary | ICD-10-CM | POA: Diagnosis not present

## 2018-10-10 DIAGNOSIS — E785 Hyperlipidemia, unspecified: Secondary | ICD-10-CM | POA: Diagnosis not present

## 2018-10-10 DIAGNOSIS — J441 Chronic obstructive pulmonary disease with (acute) exacerbation: Secondary | ICD-10-CM | POA: Diagnosis not present

## 2018-10-10 DIAGNOSIS — K219 Gastro-esophageal reflux disease without esophagitis: Secondary | ICD-10-CM | POA: Diagnosis not present

## 2018-10-13 DIAGNOSIS — L400 Psoriasis vulgaris: Secondary | ICD-10-CM | POA: Diagnosis not present

## 2018-10-13 DIAGNOSIS — Z79899 Other long term (current) drug therapy: Secondary | ICD-10-CM | POA: Diagnosis not present

## 2018-10-13 DIAGNOSIS — L403 Pustulosis palmaris et plantaris: Secondary | ICD-10-CM | POA: Diagnosis not present

## 2018-10-31 DIAGNOSIS — Z1231 Encounter for screening mammogram for malignant neoplasm of breast: Secondary | ICD-10-CM | POA: Diagnosis not present

## 2018-11-06 DIAGNOSIS — H01001 Unspecified blepharitis right upper eyelid: Secondary | ICD-10-CM | POA: Diagnosis not present

## 2018-11-06 DIAGNOSIS — H01002 Unspecified blepharitis right lower eyelid: Secondary | ICD-10-CM | POA: Diagnosis not present

## 2018-11-06 DIAGNOSIS — H43813 Vitreous degeneration, bilateral: Secondary | ICD-10-CM | POA: Diagnosis not present

## 2018-11-10 DIAGNOSIS — M0570 Rheumatoid arthritis with rheumatoid factor of unspecified site without organ or systems involvement: Secondary | ICD-10-CM | POA: Diagnosis not present

## 2018-11-10 DIAGNOSIS — K5900 Constipation, unspecified: Secondary | ICD-10-CM | POA: Diagnosis not present

## 2018-11-20 ENCOUNTER — Ambulatory Visit (INDEPENDENT_AMBULATORY_CARE_PROVIDER_SITE_OTHER): Payer: Medicare Other | Admitting: Cardiology

## 2018-11-20 ENCOUNTER — Ambulatory Visit: Payer: Federal, State, Local not specified - PPO

## 2018-11-20 ENCOUNTER — Encounter: Payer: Self-pay | Admitting: Cardiology

## 2018-11-20 ENCOUNTER — Other Ambulatory Visit: Payer: Self-pay

## 2018-11-20 VITALS — BP 113/67 | HR 66 | Ht 66.0 in | Wt 188.6 lb

## 2018-11-20 DIAGNOSIS — R9431 Abnormal electrocardiogram [ECG] [EKG]: Secondary | ICD-10-CM | POA: Diagnosis not present

## 2018-11-20 DIAGNOSIS — I2510 Atherosclerotic heart disease of native coronary artery without angina pectoris: Secondary | ICD-10-CM | POA: Diagnosis not present

## 2018-11-20 DIAGNOSIS — E785 Hyperlipidemia, unspecified: Secondary | ICD-10-CM | POA: Diagnosis not present

## 2018-11-20 DIAGNOSIS — R002 Palpitations: Secondary | ICD-10-CM | POA: Diagnosis not present

## 2018-11-20 DIAGNOSIS — I10 Essential (primary) hypertension: Secondary | ICD-10-CM | POA: Diagnosis not present

## 2018-11-20 NOTE — Progress Notes (Signed)
Primary Physician:  Lucianne Lei, MD   Patient ID: Cheyenne Gould, female    DOB: 05-25-1945, 73 y.o.   MRN: [id number]  Subjective:    Chief Complaint  Patient presents with  . Palpitations  . Hypertension  . Follow-up    HPI: Cheyenne Gould  is a 73 y.o. 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  Nuclear stress test on 04/26/17 had revealed markedly reduced exercise tolerance, [name], [name]  Patient needs an appointment to see me today for palpitations for the last few weeks.  She has been having daily episodes of increased awareness of her heartbeat that last for approximately 5 to 10 minutes.  Episodes can occur both at rest or with exertion.  Has some shortness of breath and fatigue with them.  She has been walking up and down her driveway with her grandchildren and tolerates this well.  No exertional chest pain.  Has chronic shortness of breath, that has been stable. Follows Dr. 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  She does report intolerance to simvastatin that she was previously placed on by [place].  States that her cholesterol has recently been elevated, but has had recent changes to her medications by her PCP.  States blood pressures been well controlled.   Past Medical History:  Diagnosis Date  . Acid reflux   . AKI (acute kidney injury) ([place]) 06/07/2015  . Allergic sinusitis   . Anemia   . CAP (community acquired pneumonia) 06/07/2015  . Chest pain 01/01/2014  . Hyperlipidemia   . Hypertension   . Hypoxia   . Rheumatoid arthritis ([place])    on Leflunomide   . Rheumatoid arthritis(714.0)    on Leflunomide    Past Surgical History:  Procedure Laterality  Date  . ABDOMINAL HYSTERECTOMY  1985   Partial  . BUNIONECTOMY  2002,12/18/2006    Social History   Socioeconomic History  . Marital status: Divorced    Spouse name: Not on file  . Number of children: 3  . Years of education: Not on file  . Highest education level: Not on file  Occupational History  . Not on file  Social Needs  . Financial resource strain: Not on file  . Food insecurity    Worry: Not on file    Inability: Not on file  . Transportation needs    Medical: Not on file    Non-medical: Not on file  Tobacco Use  . Smoking status: Former Smoker    Packs/day: 1.00    Years: 40.00    Pack years: 40.00    Types: Cigarettes    Quit date: 02/20/2003    Years since quitting: 15.7  . Smokeless tobacco: Never Used  Substance and Sexual Activity  . Alcohol use: No  . Drug use: No  . Sexual activity: Not on file  Lifestyle  . Physical activity    Days per week: Not on file    Minutes per session: Not on file  . Stress: Not on file  Relationships  . Social Herbalist on phone: Not on file    Gets together: Not on file    Attends religious service: Not on file    Active  member of club or organization: Not on file    Attends meetings of clubs or organizations: Not on file    Relationship status: Not on file  . Intimate partner violence    Fear of current or ex partner: Not on file    Emotionally abused: Not on file    Physically abused: Not on file    Forced sexual activity: Not on file  Other Topics Concern  . Not on file  Social History Narrative  . Not on file    Review of Systems  Constitution: Negative for decreased appetite, malaise/fatigue, weight gain and weight loss.  Eyes: Negative for visual disturbance.  Cardiovascular: Positive for dyspnea on exertion and palpitations. Negative for chest pain, claudication, leg swelling, orthopnea and syncope.  Respiratory: Negative for hemoptysis and wheezing.   Endocrine: Negative for cold  intolerance and heat intolerance.  Hematologic/Lymphatic: Does not bruise/bleed easily.  Skin: Negative for nail changes.  Musculoskeletal: Positive for back pain and joint pain. Negative for muscle weakness and myalgias.  Gastrointestinal: Negative for abdominal pain, change in bowel habit, nausea and vomiting.  Neurological: Negative for difficulty with concentration, dizziness, focal weakness and headaches.  Psychiatric/Behavioral: Negative for altered mental status and suicidal ideas.  All other systems reviewed and are negative.     Objective:  Blood pressure 113/67, pulse 66, height 5' 6"  (1.676 m), weight 188 lb 9.6 oz (85.5 kg), SpO2 98 %. Body mass index is 30.44 kg/m.    Physical Exam  Constitutional: She is oriented to person, place, and time. Vital signs are normal. She appears well-developed and well-nourished.  HENT:  Head: Normocephalic and atraumatic.  Neck: Normal range of motion.  Cardiovascular: Normal rate, regular rhythm, normal heart sounds and intact distal pulses.  Pulmonary/Chest: Effort normal and breath sounds normal. No accessory muscle usage. No respiratory distress.  Abdominal: Soft. Bowel sounds are normal.  Musculoskeletal: Normal range of motion.  Neurological: She is alert and oriented to person, place, and time.  Skin: Skin is warm and dry.  Vitals reviewed.  Radiology:   09/25/2017: CT of the chest, coronary atherosclerosis of LAD and left circumflex. Mild atherosclerotic [place] conditions of the aortic arch. Stable 6 mm nodule in right lower lobe likely benign.  Laboratory examination:  Labs 11/21/2017: Total cholesterol 194, triglycerides 49, HDL 78, LDL 106. Serum glucose 91 mg, BUN 19, creatinine 1.18, eGFR 54 mL, potassium 4.4.   10/04/2017: Troponin less than 0.01.   09/13/2017: Creatinine 1.3, EGFR 41/47, potassium 3.9, CMP otherwise normal. RBC 3.69, hemoglobin 10.8, CBC otherwise normal.  CMP Latest Ref Rng & Units 06/14/2015 06/13/2015 06/12/2015   Glucose 65 - 99 mg/dL 107(H) 97 108(H)  BUN 6 - 20 mg/dL 23(H) 26(H) 29(H)  Creatinine 0.44 - 1.00 mg/dL 1.10(H) 1.29(H) 1.45(H)  Sodium 135 - 145 mmol/L 143 140 137  Potassium 3.5 - 5.1 mmol/L 3.8 3.9 4.7  Chloride 101 - 111 mmol/L 111 109 108  CO2 22 - 32 mmol/L 24 22 20(L)  Calcium 8.9 - 10.3 mg/dL 8.8(L) 8.5(L) 8.4(L)  Total Protein 6.5 - 8.1 g/dL - - -  Total Bilirubin 0.3 - 1.2 mg/dL - - -  Alkaline Phos 38 - 126 U/L - - -  AST 15 - 41 U/L - - -  ALT 14 - 54 U/L - - -   CBC Latest Ref Rng & Units 06/14/2015 06/13/2015 06/12/2015  WBC 4.0 - 10.5 K/uL 16.3(H) 14.8(H) 16.1(H)  Hemoglobin 12.0 - 15.0 g/dL 8.6(L) 7.7(L) 8.0(L)  Hematocrit 36.0 - 46.0 % 25.1(L) 22.2(L) 22.9(L)  Platelets 150 - 400 K/uL 487(H) 433(H) 416(H)   Lipid Panel     Component Value Date/Time   CHOL 209 (H) 01/02/2014 0858   TRIG 47 01/02/2014 0858   HDL 78 01/02/2014 0858   CHOLHDL 2.7 01/02/2014 0858   VLDL 9 01/02/2014 0858   LDLCALC 122 (H) 01/02/2014 0858   HEMOGLOBIN A1C Lab Results  Component Value Date   HGBA1C 5.8 (H) 06/08/2015   MPG 120 06/08/2015   TSH No results for input(s): TSH in the last 8760 hours.  PRN Meds:. Medications Discontinued During This Encounter  Medication Reason  . ranitidine (ZANTAC) 150 MG tablet Error  . permethrin (ELIMITE) 5 % cream Error  . triamcinolone cream (KENALOG) 0.1 % Error   Current Meds  Medication Sig  . acetaminophen (TYLENOL) 500 MG tablet Take 500 mg by mouth every 6 (six) hours as needed for mild pain or headache.   Marland Kitchen amLODIPine-Valsartan-HCTZ 5-160-12.5 MG TABS Take by mouth.  . Cholecalciferol (VITAMIN D3) 2000 units TABS Take by mouth.  . clindamycin (CLEOCIN) 300 MG capsule Take 600 mg by mouth. Two hours prior to dental work.  . clotrimazole-betamethasone (LOTRISONE) cream Apply 1 application topically 2 (two) times daily.  Marland Kitchen EPIPEN 2-PAK 0.3 MG/0.3ML SOAJ injection Inject 0.3 mg into the muscle as needed (for allergic reactions).   .  folic acid (FOLVITE) 1 MG tablet 2 TABLET BY MOUTH ONCE DAILY  . golimumab 2 mg/kg in sodium chloride 0.9 % Inject 2 mg/kg into the vein every 8 (eight) weeks.   . lansoprazole (PREVACID) 15 MG capsule Take 15 mg by mouth daily at 12 noon.  Marland Kitchen levocetirizine (XYZAL) 5 MG tablet Take 5 mg by mouth at bedtime.   . methotrexate (RHEUMATREX) 2.5 MG tablet 6 TABLETS ONCE A WEEK ORALLY 90 DAYS  . metoprolol tartrate (LOPRESSOR) 25 MG tablet TAKE ONE TABLET BY MOUTH DAILY FOR HEART  . valACYclovir (VALTREX) 500 MG tablet Take 500 mg by mouth 2 (two) times daily.    Cardiac Studies:   Exercise myoview stress 04/26/2017: 1. The patient performed treadmill exercise using a Bruce protocol, completing 4:50 minutes. The patient completed an estimated workload of 4.64 METS, reaching 97% of the maximum predicted heart rate. Peak blood pressure 160/60 mmHg. Stress symptoms included dyspnea. Low exercise capacity. No ischemic changes on stress electrocardiogram. 2. The overall quality of the study is excellent. There is no evidence of abnormal lung activity. Stress and rest SPECT images demonstrate homogeneous tracer distribution throughout the myocardium. Gated SPECT imaging reveals normal myocardial thickening and wall motion. The left ventricular ejection fraction was normal (72%). 3. Low risk study.  Echocardiogram 05/08/2017: Left ventricle cavity is normal in size. Mild concentric hypertrophy of the left ventricle. Normal global wall motion. Doppler evidence of grade I (impaired) diastolic dysfunction, normal LAP. Calculated EF 67%. Moderate (Grade II) mitral regurgitation. Mild tricuspid regurgitation. Estimated pulmonary artery systolic pressure 28 mmHg. Mild pulmonic regurgitation.  Assessment:   Palpitations - Plan: EKG 12-Lead, Holter monitor - 48 hour  Coronary artery calcification seen on CT scan  Primary hypertension  Mild hyperlipidemia  EKG 11/20/2018: Normal sinus rhythm at 62 bpm,  normal axis, T wave inversion in anterior leads, cannot exclude ischemia.  Recommendations:   Patient made an appointment to see me today for palpitations.  Her symptoms of palpitations are fairly suggestive of PVCs, will further evaluate with 48-hour monitor.  She has had negative exercise nuclear stress  testing in 2019 as well as echocardiogram except for diastolic dysfunction and moderate MR.  No changes are noted to physical exam.  She has not had any symptoms of angina or clinical evidence of decompensated heart failure.  Her blood pressure is very well controlled.  Lipids are elevated, per patient, but being managed by her PCP.  She did have slight EKG changes noted, but in view of lack of symptoms, will hold off on further testing at this point.  Will await Holter monitor results and further discuss.  I will see her back after her monitor for follow-up.  No changes were made to medications today.  Miquel Dunn, MSN, APRN, FNP-C [facility]. [place] Office: [phone] Fax: [phone]

## 2018-11-21 ENCOUNTER — Ambulatory Visit: Payer: Self-pay | Admitting: Cardiology

## 2018-11-25 DIAGNOSIS — R002 Palpitations: Secondary | ICD-10-CM

## 2018-12-04 DIAGNOSIS — M0589 Other rheumatoid arthritis with rheumatoid factor of multiple sites: Secondary | ICD-10-CM | POA: Diagnosis not present

## 2018-12-04 DIAGNOSIS — Z79899 Other long term (current) drug therapy: Secondary | ICD-10-CM | POA: Diagnosis not present

## 2018-12-10 ENCOUNTER — Ambulatory Visit (INDEPENDENT_AMBULATORY_CARE_PROVIDER_SITE_OTHER): Payer: Medicare Other | Admitting: Cardiology

## 2018-12-10 ENCOUNTER — Other Ambulatory Visit: Payer: Self-pay

## 2018-12-10 ENCOUNTER — Encounter: Payer: Self-pay | Admitting: Cardiology

## 2018-12-10 VITALS — BP 110/66 | HR 79 | Temp 97.0°F | Ht 66.0 in | Wt 187.8 lb

## 2018-12-10 DIAGNOSIS — I10 Essential (primary) hypertension: Secondary | ICD-10-CM

## 2018-12-10 DIAGNOSIS — R9431 Abnormal electrocardiogram [ECG] [EKG]: Secondary | ICD-10-CM

## 2018-12-10 DIAGNOSIS — E785 Hyperlipidemia, unspecified: Secondary | ICD-10-CM | POA: Diagnosis not present

## 2018-12-10 DIAGNOSIS — R0609 Other forms of dyspnea: Secondary | ICD-10-CM

## 2018-12-10 DIAGNOSIS — R002 Palpitations: Secondary | ICD-10-CM | POA: Diagnosis not present

## 2018-12-10 DIAGNOSIS — R0600 Dyspnea, unspecified: Secondary | ICD-10-CM

## 2018-12-10 MED ORDER — METOPROLOL SUCCINATE ER 50 MG PO TB24: 50.0000 mg | ORAL_TABLET | Freq: Every day | ORAL | 2 refills | Status: DC

## 2018-12-10 NOTE — Progress Notes (Signed)
Primary Physician:  Lucianne Lei, MD   Patient ID: Cheyenne Gould, female    DOB: 1945-06-17, 73 y.o.   MRN: [id number]  Subjective:    Chief Complaint  Patient presents with  . Palpitations    pt has side effects from Statin  . Follow-up    HPI: Cheyenne Gould  is a 73 y.o. 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  Nuclear stress test on 04/26/17 had revealed markedly reduced exercise tolerance, [name], [name]  Patient was recently seen for acute visit for palpitations.  She wore a 48-hour Holter monitor revealing rare PACs.  She now presents for follow-up.  Patient continues to report dyspnea on exertion that has slightly worsened.  Dyspnea on exertion is chronic for her, follows Dr. 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  She has been unable to get an appointment with him due to Covid.  Denies any exertional chest pain.  She does report intolerance to simvastatin that she was previously placed on by [place].  States that her cholesterol has recently been elevated, but has had recent changes to her medications by her PCP.  States blood pressures been well controlled.   Past Medical History:  Diagnosis Date  . Acid reflux   . AKI (acute kidney injury) ([place]) 06/07/2015  . Allergic sinusitis   . Anemia   . CAP (community acquired pneumonia) 06/07/2015  . Chest pain 01/01/2014  . Hyperlipidemia   . Hypertension   . Hypoxia   . Rheumatoid arthritis ([place])    on Leflunomide   . Rheumatoid arthritis(714.0)    on Leflunomide    Past Surgical History:  Procedure Laterality Date  . ABDOMINAL HYSTERECTOMY  1985   Partial  . BUNIONECTOMY  2002,12/18/2006    Social History    Socioeconomic History  . Marital status: Divorced    Spouse name: Not on file  . Number of children: 3  . Years of education: Not on file  . Highest education level: Not on file  Occupational History  . Not on file  Social Needs  . Financial resource strain: Not on file  . Food insecurity    Worry: Not on file    Inability: Not on file  . Transportation needs    Medical: Not on file    Non-medical: Not on file  Tobacco Use  . Smoking status: Former Smoker    Packs/day: 1.00    Years: 40.00    Pack years: 40.00    Types: Cigarettes    Quit date: 02/20/2003    Years since quitting: 15.8  . Smokeless tobacco: Never Used  Substance and Sexual Activity  . Alcohol use: No  . Drug use: No  . Sexual activity: Not on file  Lifestyle  . Physical activity    Days per week: Not on file    Minutes per session: Not on file  . Stress: Not on file  Relationships  . Social Herbalist on phone: Not on file    Gets together: Not on file    Attends religious service: Not on file    Active member of club or organization: Not on file    Attends meetings of clubs or organizations:  Not on file    Relationship status: Not on file  . Intimate partner violence    Fear of current or ex partner: Not on file    Emotionally abused: Not on file    Physically abused: Not on file    Forced sexual activity: Not on file  Other Topics Concern  . Not on file  Social History Narrative  . Not on file    Review of Systems  Constitution: Negative for decreased appetite, malaise/fatigue, weight gain and weight loss.  Eyes: Negative for visual disturbance.  Cardiovascular: Positive for dyspnea on exertion and palpitations. Negative for chest pain, claudication, leg swelling, orthopnea and syncope.  Respiratory: Negative for hemoptysis and wheezing.   Endocrine: Negative for cold intolerance and heat intolerance.  Hematologic/Lymphatic: Does not bruise/bleed easily.  Skin: Negative for  nail changes.  Musculoskeletal: Positive for back pain and joint pain. Negative for muscle weakness and myalgias.  Gastrointestinal: Negative for abdominal pain, change in bowel habit, nausea and vomiting.  Neurological: Negative for difficulty with concentration, dizziness, focal weakness and headaches.  Psychiatric/Behavioral: Negative for altered mental status and suicidal ideas.  All other systems reviewed and are negative.     Objective:  Blood pressure 110/66, pulse 79, temperature (!) 97 F (36.1 C), height 5' 6"  (1.676 m), weight 187 lb 12.8 oz (85.2 kg), SpO2 97 %. Body mass index is 30.31 kg/m.    Physical Exam  Constitutional: She is oriented to person, place, and time. Vital signs are normal. She appears well-developed and well-nourished.  HENT:  Head: Normocephalic and atraumatic.  Neck: Normal range of motion.  Cardiovascular: Normal rate, regular rhythm, normal heart sounds and intact distal pulses.  Pulmonary/Chest: Effort normal and breath sounds normal. No accessory muscle usage. No respiratory distress.  Abdominal: Soft. Bowel sounds are normal.  Musculoskeletal: Normal range of motion.  Neurological: She is alert and oriented to person, place, and time.  Skin: Skin is warm and dry.  Vitals reviewed.  Radiology:   09/25/2017: CT of the chest, coronary atherosclerosis of LAD and left circumflex. Mild atherosclerotic [place] conditions of the aortic arch. Stable 6 mm nodule in right lower lobe likely benign.  Laboratory examination:  Labs 11/21/2017: Total cholesterol 194, triglycerides 49, HDL 78, LDL 106. Serum glucose 91 mg, BUN 19, creatinine 1.18, eGFR 54 mL, potassium 4.4.   10/04/2017: Troponin less than 0.01.   09/13/2017: Creatinine 1.3, EGFR 41/47, potassium 3.9, CMP otherwise normal. RBC 3.69, hemoglobin 10.8, CBC otherwise normal.  CMP Latest Ref Rng & Units 06/14/2015 06/13/2015 06/12/2015  Glucose 65 - 99 mg/dL 107(H) 97 108(H)  BUN 6 - 20 mg/dL 23(H) 26(H)  29(H)  Creatinine 0.44 - 1.00 mg/dL 1.10(H) 1.29(H) 1.45(H)  Sodium 135 - 145 mmol/L 143 140 137  Potassium 3.5 - 5.1 mmol/L 3.8 3.9 4.7  Chloride 101 - 111 mmol/L 111 109 108  CO2 22 - 32 mmol/L 24 22 20(L)  Calcium 8.9 - 10.3 mg/dL 8.8(L) 8.5(L) 8.4(L)  Total Protein 6.5 - 8.1 g/dL - - -  Total Bilirubin 0.3 - 1.2 mg/dL - - -  Alkaline Phos 38 - 126 U/L - - -  AST 15 - 41 U/L - - -  ALT 14 - 54 U/L - - -   CBC Latest Ref Rng & Units 06/14/2015 06/13/2015 06/12/2015  WBC 4.0 - 10.5 K/uL 16.3(H) 14.8(H) 16.1(H)  Hemoglobin 12.0 - 15.0 g/dL 8.6(L) 7.7(L) 8.0(L)  Hematocrit 36.0 - 46.0 % 25.1(L) 22.2(L) 22.9(L)  Platelets 150 -  400 K/uL 487(H) 433(H) 416(H)   Lipid Panel     Component Value Date/Time   CHOL 209 (H) 01/02/2014 0858   TRIG 47 01/02/2014 0858   HDL 78 01/02/2014 0858   CHOLHDL 2.7 01/02/2014 0858   VLDL 9 01/02/2014 0858   LDLCALC 122 (H) 01/02/2014 0858   HEMOGLOBIN A1C Lab Results  Component Value Date   HGBA1C 5.8 (H) 06/08/2015   MPG 120 06/08/2015   TSH No results for input(s): TSH in the last 8760 hours.  PRN Meds:. Medications Discontinued During This Encounter  Medication Reason  . metoprolol tartrate (LOPRESSOR) 25 MG tablet Dose change   Current Meds  Medication Sig  . acetaminophen (TYLENOL) 500 MG tablet Take 500 mg by mouth every 6 (six) hours as needed for mild pain or headache.   . Acetaminophen-Codeine 300-30 MG tablet Take 1-2 tablets by mouth. for pain  . albuterol (PROAIR HFA) 108 (90 Base) MCG/ACT inhaler Inhale 2 puffs into the lungs every 6 (six) hours as needed for wheezing or shortness of breath.  Marland Kitchen amLODIPine-Valsartan-HCTZ 5-160-12.5 MG TABS Take by mouth.  . Cholecalciferol (VITAMIN D3) 2000 units TABS Take by mouth.  . clindamycin (CLEOCIN) 300 MG capsule Take 600 mg by mouth. Two hours prior to dental work.  . clotrimazole-betamethasone (LOTRISONE) cream Apply 1 application topically 2 (two) times daily.  Marland Kitchen EPIPEN 2-PAK 0.3  MG/0.3ML SOAJ injection Inject 0.3 mg into the muscle as needed (for allergic reactions).   . folic acid (FOLVITE) 1 MG tablet 2 TABLET BY MOUTH ONCE DAILY  . golimumab 2 mg/kg in sodium chloride 0.9 % Inject 2 mg/kg into the vein every 8 (eight) weeks.   . lansoprazole (PREVACID) 15 MG capsule Take 15 mg by mouth daily at 12 noon.  Marland Kitchen levocetirizine (XYZAL) 5 MG tablet Take 5 mg by mouth at bedtime.   . methotrexate (RHEUMATREX) 2.5 MG tablet 7 tablets once a week  . valACYclovir (VALTREX) 500 MG tablet Take 500 mg by mouth 2 (two) times daily.  . [DISCONTINUED] metoprolol tartrate (LOPRESSOR) 25 MG tablet TAKE ONE TABLET BY MOUTH DAILY FOR HEART    Cardiac Studies:   48-hour Holter monitor 11/20/2018: Normal sinus rhythm.  Rare PAC.  No reported symptoms.  Exercise myoview stress 04/26/2017: 1. The patient performed treadmill exercise using a Bruce protocol, completing 4:50 minutes. The patient completed an estimated workload of 4.64 METS, reaching 97% of the maximum predicted heart rate. Peak blood pressure 160/60 mmHg. Stress symptoms included dyspnea. Low exercise capacity. No ischemic changes on stress electrocardiogram. 2. The overall quality of the study is excellent. There is no evidence of abnormal lung activity. Stress and rest SPECT images demonstrate homogeneous tracer distribution throughout the myocardium. Gated SPECT imaging reveals normal myocardial thickening and wall motion. The left ventricular ejection fraction was normal (72%). 3. Low risk study.  Echocardiogram 05/08/2017: Left ventricle cavity is normal in size. Mild concentric hypertrophy of the left ventricle. Normal global wall motion. Doppler evidence of grade I (impaired) diastolic dysfunction, normal LAP. Calculated EF 67%. Moderate (Grade II) mitral regurgitation. Mild tricuspid regurgitation. Estimated pulmonary artery systolic pressure 28 mmHg. Mild pulmonic regurgitation.  Assessment:   Palpitations   Dyspnea on exertion  Primary hypertension  Mild hyperlipidemia  Abnormal EKG  EKG 11/20/2018: Normal sinus rhythm at 62 bpm, normal axis, T wave inversion in anterior leads, cannot exclude ischemia.  Recommendations:   I discussed 48-hour Holter monitor results with the patient, had rare PAC, but is symptomatic with  this.  She is also complaining of worsening dyspnea on exertion.  Question if her dyspnea is related to cardiac or pulmonary etiology.  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  She has had negative nuclear stress testing a year ago.  I will increase her metoprolol succinate up to 50 mg both for her palpitations and also potentially cardiac etiology for her dyspnea.  She does have significant risk factors for CAD.  I have recommended she follow-up with Dr. Vaughan Browner and will have our office reach out to his office without scheduling an appointment.  If etiology for her dyspnea is not felt to be pulmonary related, will further evaluate with coronary angiogram.  Blood pressure is well controlled.  She is now on Crestor given by her PCP, lipids at her last check were slightly elevated.  She will follow-up with her PCP regarding this.  I will see her back in 6 weeks for follow-up.  Miquel Dunn, MSN, APRN, FNP-C [facility]. [place] Office: [phone] Fax: [phone]

## 2018-12-11 DIAGNOSIS — L403 Pustulosis palmaris et plantaris: Secondary | ICD-10-CM | POA: Diagnosis not present

## 2018-12-11 DIAGNOSIS — Z79899 Other long term (current) drug therapy: Secondary | ICD-10-CM | POA: Diagnosis not present

## 2018-12-11 DIAGNOSIS — L245 Irritant contact dermatitis due to other chemical products: Secondary | ICD-10-CM | POA: Diagnosis not present

## 2018-12-12 ENCOUNTER — Ambulatory Visit (INDEPENDENT_AMBULATORY_CARE_PROVIDER_SITE_OTHER): Payer: Medicare Other | Admitting: Pulmonary Disease

## 2018-12-12 ENCOUNTER — Other Ambulatory Visit: Payer: Self-pay

## 2018-12-12 ENCOUNTER — Encounter: Payer: Self-pay | Admitting: Pulmonary Disease

## 2018-12-12 VITALS — BP 114/68 | HR 60 | Temp 97.2°F | Ht 66.0 in | Wt 187.6 lb

## 2018-12-12 DIAGNOSIS — R0600 Dyspnea, unspecified: Secondary | ICD-10-CM

## 2018-12-12 DIAGNOSIS — I2510 Atherosclerotic heart disease of native coronary artery without angina pectoris: Secondary | ICD-10-CM

## 2018-12-12 DIAGNOSIS — R0602 Shortness of breath: Secondary | ICD-10-CM | POA: Diagnosis not present

## 2018-12-12 NOTE — Progress Notes (Signed)
Cheyenne Gould    [id number]    23-May-1945  Primary Care Physician:Bland, Myra Rude, MD  Referring Physician: Lucianne Lei, MD [address] [place],  [place] [address]  Chief complaint: Follow up for dyspnea  HPI: 73 year old with history of rheumatoid, psoriatic arthritis, pustular psoriasis, GERD, hypertension.  Here for evaluation of dyspnea This is been progressive over the past year.  Has only dyspnea on exertion, no symptoms at rest.  Denies any cough, sputum production, fevers, chills.  She follows with Dr. Amil Amen for arthritis.  She had been previously treated with [place] which had to be stopped due to side effects and Remicade which had to be stopped after she developed sepsis and pneumonia.  She also had been tried on [place] with no efficacy.  She is currently on Simponi and methotrexate.  She was evaluated by Dr. Einar Gip had an echo and stress test which are normal as per the patient   She has history of GERD and is on Prilosec.  Notices that dyspnea is better when she stops the Prilosec however has recurrence of heartburn when off Prilosec.  Has had a 30 pound weight gain over the past 1 year but feels that her dyspnea is not related to the weight gain.  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  Quit in 2005 Travel History: Lived in [place] for 5 years otherwise was a resident of [place].  No significant travel.  Interim history: Has some worsening of dyspnea over the past year.  Has dyspnea at rest and also palpitation for which she has been evaluated at [facility].  She is using the albuterol intermittently.   Outpatient Encounter Medications as of 12/12/2018  Medication Sig  . acetaminophen (TYLENOL) 500 MG tablet Take 500 mg by mouth every 6 (six) hours as needed for mild  pain or headache.   . Acetaminophen-Codeine 300-30 MG tablet Take 1-2 tablets by mouth. for pain  . albuterol (PROAIR HFA) 108 (90 Base) MCG/ACT inhaler Inhale 2 puffs into the lungs every 6 (six) hours as needed for wheezing or shortness of breath.  Marland Kitchen amLODIPine-Valsartan-HCTZ 5-160-12.5 MG TABS Take by mouth.  . Cholecalciferol (VITAMIN D3) 2000 units TABS Take by mouth.  . clindamycin (CLEOCIN) 300 MG capsule Take 600 mg by mouth. Two hours prior to dental work.  . clotrimazole-betamethasone (LOTRISONE) cream Apply 1 application topically 2 (two) times daily.  Marland Kitchen EPIPEN 2-PAK 0.3 MG/0.3ML SOAJ injection Inject 0.3 mg into the muscle as needed (for allergic reactions).   . folic acid (FOLVITE) 1 MG tablet 2 TABLET BY MOUTH ONCE DAILY  . golimumab 2 mg/kg in sodium chloride 0.9 % Inject 2 mg/kg into the vein every 8 (eight) weeks.   . lansoprazole (PREVACID) 15 MG capsule Take 15 mg by mouth daily at 12 noon.  Marland Kitchen levocetirizine (XYZAL) 5 MG tablet Take 5 mg by mouth at bedtime.   . methotrexate (RHEUMATREX) 2.5 MG tablet 7 tablets once a week  . metoprolol succinate (TOPROL-XL) 50 MG 24 hr tablet Take 1 tablet (50 mg total) by mouth daily. Take with or immediately following a meal.  . rosuvastatin (CRESTOR) 20 MG tablet Take 20 mg by mouth daily.  . valACYclovir (VALTREX) 500 MG tablet Take 500 mg by mouth 2 (two) times daily.  No facility-administered encounter medications on file as of 12/12/2018.    Physical Exam: Blood pressure 128/68, pulse 85, height 5\' 6"  (1.676 m), weight 190 lb 6.4 oz (86.4 kg), SpO2 98 %. Gen:      No acute distress HEENT:  EOMI, sclera anicteric Neck:     No masses; no thyromegaly Lungs:    Clear to auscultation bilaterally; normal respiratory effort CV:         Regular rate and rhythm; no murmurs Abd:      + bowel sounds; soft, non-tender; no palpable masses, no distension Ext:    No edema; adequate peripheral perfusion Skin:      Warm and dry; no rash Neuro:  alert and oriented x 3 Psych: normal mood and affect  Data Reviewed: Imaging Chest x-ray 06/10/15-bilateral lung infiltrates Chest x-ray 06/25/15-resolution of right upper lobe infiltrate and left basilar pneumonia.  There is linear atelectasis at the right base with right diaphragm elevation. High-resolution CT 06/05/2017- mild atrophy, no evidence of interstitial lung disease.  Subcentimeter pulmonary nodule, coronary atherosclerosis.  Mild scarring in the right middle, lower lobes CT chest 09/25/2017- stable subcentimeter pulmonary nodules, coronary atherosclerosis. I have reviewed the images personally.  PFTs 06/28/2017 FVC 1.76 [7%), FEV1 1.58 [78%], F/F 90, TLC 69%, DLCO 54%, DLCO/VA 83% Mild to moderate diffusion defect that corrects for alveolar volume.  FENO 06/28/2017-9  Assessment:  Follow-up for dyspnea PFTs reviewed with no evidence of obstruction however there is mild airtrapping suggestive of small airway disease There is no evidence of COPD or asthma.   She has history of rheumatoid arthritis with no interstitial lung disease on CT on prior scans  However given recent worsening of her dyspnea we will reevaluate with high-resolution CT and spirometry diffusion capacity Discussed empiric trial of inhaler but she like to avoid it if possible  Subcentimeter pulmonary nodule CT scan from August 2019 reviewed with stable lung nodules are likely benign Follow-up on CT scan  Coronary atherosclerosis on CT scan Follows with Dr. September 2019, cardiology  Plan/Recommendations: - Albuterol inhaler as needed - High-res CT, PFTs   Jacinto Halim MD [facility] 12/12/2018, 9:12 AM  CC: 12/14/2018, MD

## 2018-12-12 NOTE — Addendum Note (Signed)
Addended by: Hildred Alamin I on: 12/12/2018 09:22 AM   Modules accepted: Orders

## 2018-12-12 NOTE — Patient Instructions (Signed)
Sorry your breathing is not doing as well as last time We will leave for high-resolution CT and repeat spirometry, diffusion capacity for evaluation of your lung Return to clinic in 1 to 2 months after this test for review and plan for next steps.

## 2018-12-18 ENCOUNTER — Other Ambulatory Visit: Payer: Medicare Other

## 2018-12-19 ENCOUNTER — Ambulatory Visit
Admission: RE | Admit: 2018-12-19 | Discharge: 2018-12-19 | Disposition: A | Discharge status: Home / Self Care | Payer: Federal, State, Local not specified - PPO | Source: Ambulatory Visit | Attending: Pulmonary Disease | Admitting: Pulmonary Disease

## 2018-12-19 DIAGNOSIS — R918 Other nonspecific abnormal finding of lung field: Secondary | ICD-10-CM | POA: Diagnosis not present

## 2018-12-19 DIAGNOSIS — R0602 Shortness of breath: Secondary | ICD-10-CM

## 2019-01-02 DIAGNOSIS — M5136 Other intervertebral disc degeneration, lumbar region: Secondary | ICD-10-CM | POA: Diagnosis not present

## 2019-01-02 DIAGNOSIS — M25512 Pain in left shoulder: Secondary | ICD-10-CM | POA: Diagnosis not present

## 2019-01-02 DIAGNOSIS — M150 Primary generalized (osteo)arthritis: Secondary | ICD-10-CM | POA: Diagnosis not present

## 2019-01-02 DIAGNOSIS — E669 Obesity, unspecified: Secondary | ICD-10-CM | POA: Diagnosis not present

## 2019-01-02 DIAGNOSIS — L401 Generalized pustular psoriasis: Secondary | ICD-10-CM | POA: Diagnosis not present

## 2019-01-02 DIAGNOSIS — R0602 Shortness of breath: Secondary | ICD-10-CM | POA: Diagnosis not present

## 2019-01-02 DIAGNOSIS — L4059 Other psoriatic arthropathy: Secondary | ICD-10-CM | POA: Diagnosis not present

## 2019-01-02 DIAGNOSIS — M0589 Other rheumatoid arthritis with rheumatoid factor of multiple sites: Secondary | ICD-10-CM | POA: Diagnosis not present

## 2019-01-02 DIAGNOSIS — Z6830 Body mass index (BMI) 30.0-30.9, adult: Secondary | ICD-10-CM | POA: Diagnosis not present

## 2019-01-21 ENCOUNTER — Ambulatory Visit (INDEPENDENT_AMBULATORY_CARE_PROVIDER_SITE_OTHER): Payer: Medicare Other | Admitting: Cardiology

## 2019-01-21 ENCOUNTER — Encounter: Payer: Self-pay | Admitting: Cardiology

## 2019-01-21 ENCOUNTER — Other Ambulatory Visit: Payer: Self-pay

## 2019-01-21 ENCOUNTER — Encounter: Payer: Self-pay | Admitting: Pulmonary Disease

## 2019-01-21 ENCOUNTER — Ambulatory Visit (INDEPENDENT_AMBULATORY_CARE_PROVIDER_SITE_OTHER): Payer: Medicare Other | Admitting: Pulmonary Disease

## 2019-01-21 VITALS — BP 118/60 | HR 85 | Temp 97.4°F | Ht 66.0 in | Wt 189.8 lb

## 2019-01-21 VITALS — BP 118/72 | HR 74 | Ht 66.0 in | Wt 189.6 lb

## 2019-01-21 DIAGNOSIS — R0609 Other forms of dyspnea: Secondary | ICD-10-CM

## 2019-01-21 DIAGNOSIS — R0600 Dyspnea, unspecified: Secondary | ICD-10-CM

## 2019-01-21 DIAGNOSIS — I2510 Atherosclerotic heart disease of native coronary artery without angina pectoris: Secondary | ICD-10-CM

## 2019-01-21 DIAGNOSIS — J849 Interstitial pulmonary disease, unspecified: Secondary | ICD-10-CM | POA: Diagnosis not present

## 2019-01-21 DIAGNOSIS — E785 Hyperlipidemia, unspecified: Secondary | ICD-10-CM | POA: Diagnosis not present

## 2019-01-21 DIAGNOSIS — R002 Palpitations: Secondary | ICD-10-CM | POA: Diagnosis not present

## 2019-01-21 DIAGNOSIS — M069 Rheumatoid arthritis, unspecified: Secondary | ICD-10-CM

## 2019-01-21 LAB — CBC WITH DIFFERENTIAL/PLATELET
Basophils Absolute: 0.1 10*3/uL (ref 0.0–0.1)
Basophils Relative: 1 % (ref 0.0–3.0)
Eosinophils Absolute: 0.3 10*3/uL (ref 0.0–0.7)
Eosinophils Relative: 4.7 % (ref 0.0–5.0)
HCT: 33.8 % — ABNORMAL LOW (ref 36.0–46.0)
Hemoglobin: 11.2 g/dL — ABNORMAL LOW (ref 12.0–15.0)
Lymphocytes Relative: 24 % (ref 12.0–46.0)
Lymphs Abs: 1.7 10*3/uL (ref 0.7–4.0)
MCHC: 33 g/dL (ref 30.0–36.0)
MCV: 92 fl (ref 78.0–100.0)
Monocytes Absolute: 0.5 10*3/uL (ref 0.1–1.0)
Monocytes Relative: 6.9 % (ref 3.0–12.0)
Neutro Abs: 4.4 10*3/uL (ref 1.4–7.7)
Neutrophils Relative %: 63.4 % (ref 43.0–77.0)
Platelets: 342 10*3/uL (ref 150.0–400.0)
RBC: 3.67 Mil/uL — ABNORMAL LOW (ref 3.87–5.11)
RDW: 15.9 % — ABNORMAL HIGH (ref 11.5–15.5)
WBC: 7 10*3/uL (ref 4.0–10.5)

## 2019-01-21 LAB — COMPREHENSIVE METABOLIC PANEL
ALT: 9 U/L (ref 0–35)
AST: 16 U/L (ref 0–37)
Albumin: 3.8 g/dL (ref 3.5–5.2)
Alkaline Phosphatase: 111 U/L (ref 39–117)
BUN: 15 mg/dL (ref 6–23)
CO2: 25 mEq/L (ref 19–32)
Calcium: 9.4 mg/dL (ref 8.4–10.5)
Chloride: 103 mEq/L (ref 96–112)
Creatinine, Ser: 1.13 mg/dL (ref 0.40–1.20)
GFR: 57.3 mL/min — ABNORMAL LOW (ref >60.00)
Glucose, Bld: 95 mg/dL (ref 70–99)
Potassium: 3.3 mEq/L — ABNORMAL LOW (ref 3.5–5.1)
Sodium: 137 mEq/L (ref 135–145)
Total Bilirubin: 0.4 mg/dL (ref 0.2–1.2)
Total Protein: 6.9 g/dL (ref 6.0–8.3)

## 2019-01-21 MED ORDER — SIMVASTATIN 10 MG PO TABS: 10.0000 mg | ORAL_TABLET | Freq: Every day | ORAL | 2 refills | Status: DC

## 2019-01-21 MED ORDER — BUDESONIDE-FORMOTEROL FUMARATE 160-4.5 MCG/ACT IN AERO: 2.0000 | INHALATION_SPRAY | Freq: Two times a day (BID) | RESPIRATORY_TRACT | 2 refills | Status: DC

## 2019-01-21 NOTE — Progress Notes (Signed)
Cheyenne Gould    [id number]    Oct 29, 1945  Primary Care Physician:Bland, Adrian Saran, MD  Referring Physician: [name], MD [address] [place],  [place] [address]  Chief complaint: Follow up for dyspnea  HPI: 73 year old with history of rheumatoid, psoriatic arthritis, pustular psoriasis, GERD, hypertension.  Here for evaluation of dyspnea This is been progressive over the past year.  Has only dyspnea on exertion, no symptoms at rest.  Denies any cough, sputum production, fevers, chills.  She follows with Dr. Dierdre Forth for arthritis.  She had been previously treated with [place] which had to be stopped due to side effects and Remicade which had to be stopped after she developed sepsis and pneumonia.  She also had been tried on [place] with no efficacy.  She is currently on Simponi and methotrexate.  She was evaluated by Dr. Jacinto Halim had an echo and stress test which are normal as per the patient   She has history of GERD and is on Prilosec.  Notices that dyspnea is better when she stops the Prilosec however has recurrence of heartburn when off Prilosec.  Has had a 30 pound weight gain over the past 1 year but feels that her dyspnea is not related to the weight gain.  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  Quit in 2005 Travel History: Lived in [place] for 5 years otherwise was a resident of [place].  No significant travel.  Interim history: Evaluated in October for worsening dyspnea.  She had a high-res CT which showed mild centrilobular nodularity suggestive of hypersensitivity pneumonitis.  However ILD questionnare does not reveal any significant exposures.  Here for review.  States that her breathing is same.  She has  ongoing dyspnea on exertion.  No cough, sputum production, wheezing.  Outpatient Encounter Medications as of 01/21/2019  Medication Sig  . acetaminophen (TYLENOL) 500 MG tablet Take 500 mg by mouth every 6 (six) hours as needed for mild pain or headache.   . Acetaminophen-Codeine 300-30 MG tablet Take 1-2 tablets by mouth. for pain  . albuterol (PROAIR HFA) 108 (90 Base) MCG/ACT inhaler Inhale 2 puffs into the lungs every 6 (six) hours as needed for wheezing or shortness of breath.  Marland Kitchen amLODIPine-Valsartan-HCTZ 5-160-12.5 MG TABS Take by mouth.  . Cholecalciferol (VITAMIN D3) 2000 units TABS Take by mouth.  . CLARAVIS 40 MG capsule Take 40 mg by mouth 2 (two) times daily.  . clindamycin (CLEOCIN) 300 MG capsule Take 600 mg by mouth. Two hours prior to dental work.  . clotrimazole-betamethasone (LOTRISONE) cream Apply 1 application topically 2 (two) times daily.  Marland Kitchen EPIPEN 2-PAK 0.3 MG/0.3ML SOAJ injection Inject 0.3 mg into the muscle as needed (for allergic reactions).   . folic acid (FOLVITE) 1 MG tablet 2 TABLET BY MOUTH ONCE DAILY  . golimumab 2 mg/kg in sodium chloride 0.9 % Inject 2 mg/kg into the vein every 8 (eight) weeks.   . lansoprazole (PREVACID) 15 MG capsule Take 15 mg by mouth daily at 12 noon.  Marland Kitchen levocetirizine (XYZAL) 5 MG tablet Take 5 mg by mouth at bedtime.   . methotrexate (RHEUMATREX) 2.5 MG tablet 7 tablets once a week  . metoprolol  succinate (TOPROL-XL) 50 MG 24 hr tablet Take 1 tablet (50 mg total) by mouth daily. Take with or immediately following a meal.  . valACYclovir (VALTREX) 500 MG tablet Take 500 mg by mouth 2 (two) times daily.  . [DISCONTINUED] rosuvastatin (CRESTOR) 20 MG tablet Take 20 mg by mouth daily.   No facility-administered encounter medications on file as of 01/21/2019.    Physical Exam: Blood pressure 118/60, pulse 85, temperature (!) 97.4 F (36.3 C), temperature source Temporal, height 5\' 6"  (1.676 m), weight 189 lb 12.8 oz (86.1 kg), SpO2 99  %. Gen:      No acute distress HEENT:  EOMI, sclera anicteric Neck:     No masses; no thyromegaly Lungs:    Clear to auscultation bilaterally; normal respiratory effort CV:         Regular rate and rhythm; no murmurs Abd:      + bowel sounds; soft, non-tender; no palpable masses, no distension Ext:    No edema; adequate peripheral perfusion Skin:      Warm and dry; no rash Neuro: alert and oriented x 3 Psych: normal mood and affect,e  Data Reviewed: Imaging Chest x-ray 06/10/15-bilateral lung infiltrates Chest x-ray 06/25/15-resolution of right upper lobe infiltrate and left basilar pneumonia.  There is linear atelectasis at the right base with right diaphragm elevation. High-resolution CT 06/05/2017- mild atrophy, no evidence of interstitial lung disease.  Subcentimeter pulmonary nodule, coronary atherosclerosis.  Mild scarring in the right middle, lower lobes CT chest 09/25/2017- stable subcentimeter pulmonary nodules, coronary atherosclerosis. High-resolution CT 12/19/2018-mild centrilobular nodularity, coronary artery atherosclerosis, aortic atherosclerosis I have reviewed the images personally.  PFTs 06/28/2017 FVC 1.76 [7%), FEV1 1.58 [78%], F/F 90, TLC 69%, DLCO 54%, DLCO/VA 83% Mild to moderate diffusion defect that corrects for alveolar volume.  FENO 06/28/2017-9  Assessment:  Follow-up for dyspnea CT reviewed with mild centrilobular nodularity of unclear etiology.  Imaging is suggestive of hypersensitivity pneumonitis but she does not have any exposure history. She has history of rheumatoid arthritis so RA ILD is on the differential but imaging is not suggestive  We will check some basic labs including ANA, rheumatoid factor, CCP, hypersensitivity panel, CBC and CMP Discussed that to make a diagnosis she will need bronchoscope with biopsy but given mild CT changes and mild symptoms she does not want to go through with the procedure due to risk of complication  We will try an  empiric trial of Symbicort 160 and reassess in 1 to 2 months.  Subcentimeter pulmonary nodule CT scan from August 2019 reviewed with stable lung nodules are likely benign  Coronary atherosclerosis on CT scan Follows with Dr. Einar Gip, cardiology  Plan/Recommendations: - Symbicort, albuterol inhaler as needed - CTD labs, CBC, metabolic panel.   Marshell Garfinkel MD [facility] 01/21/2019, 9:31 AM  CC: Lucianne Lei, MD

## 2019-01-21 NOTE — Patient Instructions (Addendum)
I have reviewed the CT scan which shows mild pulmonary inflammation This could be from exposures or your rheumatoid arthritis  We will check some labs today including ANA, rheumatoid factor, CCP, CBC with differential and comprehensive metabolic panel Start you on Symbicort inhaler 160 Schedule spirometry, diffusion capacity Follow-up in 1 to 2 months.

## 2019-01-21 NOTE — Progress Notes (Signed)
Primary Physician:  Lucianne Lei, MD   Patient ID: Cheyenne Gould, female    DOB: 13-Aug-1945, 73 y.o.   MRN: [id number]  Subjective:    Chief Complaint  Patient presents with  . Coronary Artery Disease  . Follow-up    HPI: Cheyenne Gould  is a 73 y.o. 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  Nuclear stress test on 04/26/17 had revealed markedly reduced exercise tolerance, [name], [name]  Patient was recently seen for acute visit for palpitations.  She wore a 48-hour Holter monitor revealing rare PACs.  She now presents for follow-up.  She has had progressively worsening dyspnea on exertion over the last year. Denies any exertional chest pain. I had recommended that she follow up with Dr. Vaughan Browner for follow up to rule pulmonary etiology as she had previously seen him for air trapping. 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 She preferred to not undergo bronchoscopy for now. She is going to start Symbicort to see if she has any improvement.   She was previously on simvastatin, tolerated 10 mg but not 20 mg. She had recently been tried on Crestor that she did not tolerate due to myalgia.  States that her cholesterol has recently been elevated, results not available. She is currently not on statin. [name], [name]   Past Medical History:  Diagnosis Date  . Acid reflux   . AKI (acute kidney injury) ([place]) 06/07/2015  . Allergic sinusitis   . Anemia   . CAP (community acquired pneumonia) 06/07/2015  .  Chest pain 01/01/2014  . Hyperlipidemia   . Hypertension   . Hypoxia   . Rheumatoid arthritis ([place])    on Leflunomide   . Rheumatoid arthritis(714.0)    on Leflunomide    Past Surgical History:  Procedure Laterality Date  . ABDOMINAL HYSTERECTOMY  1985   Partial  . BUNIONECTOMY  2002,12/18/2006    Social History   Socioeconomic History  . Marital status: Divorced    Spouse name: Not on file  . Number of children: 3  . Years of education: Not on file  . Highest education level: Not on file  Occupational History  . Not on file  Social Needs  . Financial resource strain: Not on file  . Food insecurity    Worry: Not on file    Inability: Not on file  . Transportation needs    Medical: Not on file    Non-medical: Not on file  Tobacco Use  . Smoking status: Former Smoker    Packs/day: 1.00    Years: 40.00    Pack years: 40.00    Types: Cigarettes    Quit date: 02/20/2003    Years since quitting: 15.9  . Smokeless tobacco: Never Used  Substance and Sexual Activity  . Alcohol use: No  . Drug use: No  . Sexual activity: Not on file  Lifestyle  . Physical activity    Days per week: Not on file    Minutes per session: Not on file  . Stress: Not on file  Relationships  .  Social Herbalist on phone: Not on file    Gets together: Not on file    Attends religious service: Not on file    Active member of club or organization: Not on file    Attends meetings of clubs or organizations: Not on file    Relationship status: Not on file  . Intimate partner violence    Fear of current or ex partner: Not on file    Emotionally abused: Not on file    Physically abused: Not on file    Forced sexual activity: Not on file  Other Topics Concern  . Not on file  Social History Narrative  . Not on file    Review of Systems  Constitution: Negative for decreased appetite, malaise/fatigue, weight gain and weight loss.  Eyes: Negative for visual disturbance.   Cardiovascular: Positive for dyspnea on exertion and palpitations. Negative for chest pain, claudication, leg swelling, orthopnea and syncope.  Respiratory: Negative for hemoptysis and wheezing.   Endocrine: Negative for cold intolerance and heat intolerance.  Hematologic/Lymphatic: Does not bruise/bleed easily.  Skin: Negative for nail changes.  Musculoskeletal: Positive for back pain and joint pain. Negative for muscle weakness and myalgias.  Gastrointestinal: Negative for abdominal pain, change in bowel habit, nausea and vomiting.  Neurological: Negative for difficulty with concentration, dizziness, focal weakness and headaches.  Psychiatric/Behavioral: Negative for altered mental status and suicidal ideas.  All other systems reviewed and are negative.     Objective:  Blood pressure 118/72, pulse 74, height 5' 6"  (1.676 m), weight 189 lb 9.6 oz (86 kg), SpO2 99 %. Body mass index is 30.6 kg/m.    Physical Exam  Constitutional: She is oriented to person, place, and time. Vital signs are normal. She appears well-developed and well-nourished.  HENT:  Head: Normocephalic and atraumatic.  Neck: Normal range of motion.  Cardiovascular: Normal rate, regular rhythm, normal heart sounds and intact distal pulses.  Pulmonary/Chest: Effort normal and breath sounds normal. No accessory muscle usage. No respiratory distress.  Abdominal: Soft. Bowel sounds are normal.  Musculoskeletal: Normal range of motion.  Neurological: She is alert and oriented to person, place, and time.  Skin: Skin is warm and dry.  Vitals reviewed.  Radiology:   09/25/2017: CT of the chest, coronary atherosclerosis of LAD and left circumflex. Mild atherosclerotic [place] conditions of the aortic arch. Stable 6 mm nodule in right lower lobe likely benign.  Laboratory examination:  Labs 11/21/2017: Total cholesterol 194, triglycerides 49, HDL 78, LDL 106. Serum glucose 91 mg, BUN 19, creatinine 1.18, eGFR 54 mL, potassium 4.4.    10/04/2017: Troponin less than 0.01.   09/13/2017: Creatinine 1.3, EGFR 41/47, potassium 3.9, CMP otherwise normal. RBC 3.69, hemoglobin 10.8, CBC otherwise normal.  CMP Latest Ref Rng & Units 06/14/2015 06/13/2015 06/12/2015  Glucose 65 - 99 mg/dL 107(H) 97 108(H)  BUN 6 - 20 mg/dL 23(H) 26(H) 29(H)  Creatinine 0.44 - 1.00 mg/dL 1.10(H) 1.29(H) 1.45(H)  Sodium 135 - 145 mmol/L 143 140 137  Potassium 3.5 - 5.1 mmol/L 3.8 3.9 4.7  Chloride 101 - 111 mmol/L 111 109 108  CO2 22 - 32 mmol/L 24 22 20(L)  Calcium 8.9 - 10.3 mg/dL 8.8(L) 8.5(L) 8.4(L)  Total Protein 6.5 - 8.1 g/dL - - -  Total Bilirubin 0.3 - 1.2 mg/dL - - -  Alkaline Phos 38 - 126 U/L - - -  AST 15 - 41 U/L - - -  ALT 14 - 54 U/L - - -  CBC Latest Ref Rng & Units 06/14/2015 06/13/2015 06/12/2015  WBC 4.0 - 10.5 K/uL 16.3(H) 14.8(H) 16.1(H)  Hemoglobin 12.0 - 15.0 g/dL 8.6(L) 7.7(L) 8.0(L)  Hematocrit 36.0 - 46.0 % 25.1(L) 22.2(L) 22.9(L)  Platelets 150 - 400 K/uL 487(H) 433(H) 416(H)   Lipid Panel     Component Value Date/Time   CHOL 209 (H) 01/02/2014 0858   TRIG 47 01/02/2014 0858   HDL 78 01/02/2014 0858   CHOLHDL 2.7 01/02/2014 0858   VLDL 9 01/02/2014 0858   LDLCALC 122 (H) 01/02/2014 0858   HEMOGLOBIN A1C Lab Results  Component Value Date   HGBA1C 5.8 (H) 06/08/2015   MPG 120 06/08/2015   TSH No results for input(s): TSH in the last 8760 hours.  PRN Meds:. Medications Discontinued During This Encounter  Medication Reason  . Acetaminophen-Codeine 300-30 MG tablet Error   Current Meds  Medication Sig  . acetaminophen (TYLENOL) 500 MG tablet Take 500 mg by mouth every 6 (six) hours as needed for mild pain or headache.   . albuterol (PROAIR HFA) 108 (90 Base) MCG/ACT inhaler Inhale 2 puffs into the lungs every 6 (six) hours as needed for wheezing or shortness of breath.  Marland Kitchen amLODIPine-Valsartan-HCTZ 5-160-12.5 MG TABS Take by mouth daily.   . budesonide-formoterol (SYMBICORT) 160-4.5 MCG/ACT inhaler Inhale  2 puffs into the lungs 2 (two) times daily.  . Cholecalciferol (VITAMIN D3) 2000 units TABS Take by mouth.  . CLARAVIS 40 MG capsule Take 40 mg by mouth. Every other day  . clindamycin (CLEOCIN) 300 MG capsule Take 600 mg by mouth. Two hours prior to dental work.  . clotrimazole-betamethasone (LOTRISONE) cream Apply 1 application topically 2 (two) times daily.  Marland Kitchen EPIPEN 2-PAK 0.3 MG/0.3ML SOAJ injection Inject 0.3 mg into the muscle as needed (for allergic reactions).   . folic acid (FOLVITE) 1 MG tablet 2 TABLET BY MOUTH ONCE DAILY  . golimumab 2 mg/kg in sodium chloride 0.9 % Inject 2 mg/kg into the vein every 8 (eight) weeks.   . lansoprazole (PREVACID) 15 MG capsule Take 15 mg by mouth daily at 12 noon.  Marland Kitchen levocetirizine (XYZAL) 5 MG tablet Take 5 mg by mouth at bedtime.   . methotrexate (RHEUMATREX) 2.5 MG tablet 7 tablets once a week  . metoprolol succinate (TOPROL-XL) 50 MG 24 hr tablet Take 1 tablet (50 mg total) by mouth daily. Take with or immediately following a meal.  . valACYclovir (VALTREX) 500 MG tablet Take 500 mg by mouth daily.     Cardiac Studies:   48-hour Holter monitor 11/20/2018: Normal sinus rhythm.  Rare PAC.  No reported symptoms.  Exercise myoview stress 04/26/2017: 1. The patient performed treadmill exercise using a Bruce protocol, completing 4:50 minutes. The patient completed an estimated workload of 4.64 METS, reaching 97% of the maximum predicted heart rate. Peak blood pressure 160/60 mmHg. Stress symptoms included dyspnea. Low exercise capacity. No ischemic changes on stress electrocardiogram. 2. The overall quality of the study is excellent. There is no evidence of abnormal lung activity. Stress and rest SPECT images demonstrate homogeneous tracer distribution throughout the myocardium. Gated SPECT imaging reveals normal myocardial thickening and wall motion. The left ventricular ejection fraction was normal (72%). 3. Low risk study.  Echocardiogram  05/08/2017: Left ventricle cavity is normal in size. Mild concentric hypertrophy of the left ventricle. Normal global wall motion. Doppler evidence of grade I (impaired) diastolic dysfunction, normal LAP. Calculated EF 67%. Moderate (Grade II) mitral regurgitation. Mild tricuspid regurgitation. Estimated pulmonary artery systolic  pressure 28 mmHg. Mild pulmonic regurgitation.  Assessment:   Dyspnea on exertion  Coronary artery calcification seen on CT scan  Palpitations  Mild hyperlipidemia  EKG 11/20/2018: Normal sinus rhythm at 62 bpm, normal axis, T wave inversion in anterior leads, cannot exclude ischemia.  Recommendations:   Patient continues to have dyspnea on exertion that has progressively worsened over the last year.  She has been recently evaluated by Dr. Vaughan Browner.  CT scan of the chest suggestive of hypersensitivity pneumonitis versus RA ILD.  She is starting Symbicort to see if she will have improvement in symptoms.  I discussed options of pursuing coronary angiogram for further evaluation of her symptoms versus continued pulmonary management.  After further discussion, we have mutually decided that she will try the Symbicort for the next few weeks to see if she has any improvement and if there is no improvement we will plan to pursue coronary angiogram at that time.  She is not had any exertional chest pain.  She will continue to need aggressive resector modification.  She had previously tolerated simvastatin 10 mg, but developed myalgias symptoms with 20 mg.  Has also tried Crestor and had mild symptoms.  I will start her back on simvastatin 10 mg daily.  I discussed the addition of Zetia, but patient was reluctant and wanted to make diet changes.  She will need repeat lipid panel in the next 6 to 8 weeks and will consider addition of Zetia at that time.  She does continue to have occasional palpitations that are continued to be suggestive of PACs, although some improvement with  increased dose of metoprolol.  We will continue the same.  Blood pressure is well controlled.  I will see her back for telephone appointment in 4 weeks to discuss her dyspnea, but encouraged her to contact me sooner if needed.  Miquel Dunn, MSN, APRN, FNP-C [facility]. [place] Office: [phone] Fax: [phone]

## 2019-01-23 LAB — ANA: Anti Nuclear Antibody (ANA): NEGATIVE

## 2019-01-23 LAB — RHEUMATOID FACTOR: Rheumatoid fact SerPl-aCnc: 25 IU/mL — ABNORMAL HIGH (ref <14)

## 2019-01-23 LAB — CYCLIC CITRUL PEPTIDE ANTIBODY, IGG: Cyclic Citrullin Peptide Ab: 35 UNITS — ABNORMAL HIGH

## 2019-01-29 DIAGNOSIS — M0589 Other rheumatoid arthritis with rheumatoid factor of multiple sites: Secondary | ICD-10-CM | POA: Diagnosis not present

## 2019-02-03 ENCOUNTER — Ambulatory Visit
Admission: RE | Admit: 2019-02-03 | Discharge: 2019-02-03 | Disposition: A | Discharge status: Home / Self Care | Payer: Federal, State, Local not specified - PPO | Source: Ambulatory Visit | Attending: Family Medicine | Admitting: Family Medicine

## 2019-02-03 ENCOUNTER — Other Ambulatory Visit: Payer: Self-pay | Admitting: Family Medicine

## 2019-02-03 DIAGNOSIS — I10 Essential (primary) hypertension: Secondary | ICD-10-CM | POA: Diagnosis not present

## 2019-02-03 DIAGNOSIS — F064 Anxiety disorder due to known physiological condition: Secondary | ICD-10-CM | POA: Diagnosis not present

## 2019-02-03 DIAGNOSIS — J441 Chronic obstructive pulmonary disease with (acute) exacerbation: Secondary | ICD-10-CM | POA: Diagnosis not present

## 2019-02-03 DIAGNOSIS — M069 Rheumatoid arthritis, unspecified: Secondary | ICD-10-CM

## 2019-02-03 DIAGNOSIS — R200 Anesthesia of skin: Secondary | ICD-10-CM

## 2019-02-03 DIAGNOSIS — M05639 Rheumatoid arthritis of unspecified wrist with involvement of other organs and systems: Secondary | ICD-10-CM | POA: Diagnosis not present

## 2019-02-14 ENCOUNTER — Other Ambulatory Visit (HOSPITAL_COMMUNITY)
Admission: RE | Admit: 2019-02-14 | Discharge: 2019-02-14 | Disposition: A | Discharge status: Home / Self Care | Payer: Medicare Other | Source: Ambulatory Visit | Attending: Pulmonary Disease | Admitting: Pulmonary Disease

## 2019-02-14 DIAGNOSIS — Z01812 Encounter for preprocedural laboratory examination: Secondary | ICD-10-CM | POA: Diagnosis present

## 2019-02-14 DIAGNOSIS — Z20828 Contact with and (suspected) exposure to other viral communicable diseases: Secondary | ICD-10-CM | POA: Insufficient documentation

## 2019-02-14 LAB — SARS CORONAVIRUS 2 (TAT 6-24 HRS): SARS Coronavirus 2: NEGATIVE

## 2019-02-17 ENCOUNTER — Other Ambulatory Visit: Payer: Self-pay

## 2019-02-17 ENCOUNTER — Ambulatory Visit (INDEPENDENT_AMBULATORY_CARE_PROVIDER_SITE_OTHER): Payer: Medicare Other | Admitting: Pulmonary Disease

## 2019-02-17 DIAGNOSIS — J849 Interstitial pulmonary disease, unspecified: Secondary | ICD-10-CM

## 2019-02-17 LAB — PULMONARY FUNCTION TEST
DL/VA % pred: 126 %
DL/VA: 5.15 ml/min/mmHg/L
DLCO unc % pred: 76 %
DLCO unc: 15.8 ml/min/mmHg
FEF 25-75 Pre: 2.56 L/sec
FEF2575-%Pred-Pre: 142 %
FEV1-%Pred-Pre: 87 %
FEV1-Pre: 1.74 L
FEV1FVC-%Pred-Pre: 113 %
FEV6-%Pred-Pre: 80 %
FEV6-Pre: 2 L
FEV6FVC-%Pred-Pre: 103 %
FVC-%Pred-Pre: 77 %
FVC-Pre: 2 L
Pre FEV1/FVC ratio: 87 %
Pre FEV6/FVC Ratio: 100 %

## 2019-02-17 NOTE — Progress Notes (Signed)
Spiro/DLCO performed today. 

## 2019-02-18 ENCOUNTER — Ambulatory Visit: Payer: Self-pay | Admitting: *Deleted

## 2019-02-18 NOTE — Telephone Encounter (Signed)
Patient would like to speak with nurse. She recently tested negative for covid. However her daughter has tested positive and lives with her. Please advise    Pt wanted to know if she needed to get retested when her granddaughter tested positive for covid today. The patient had her test on Saturday and was negative and not having symptoms. She is advised not to get retested at this point. If she starts having symptoms to notify her pcp. She is advised to quarantine from her granddaughter. They live together. She is able to do that and advised to clean and disinfect, to wear a mask around her. She voiced understanding.

## 2019-02-19 ENCOUNTER — Encounter: Payer: Self-pay | Admitting: Cardiology

## 2019-02-19 ENCOUNTER — Telehealth: Payer: Medicare Other | Admitting: Cardiology

## 2019-02-19 VITALS — BP 123/74 | HR 73 | Ht 66.0 in | Wt 194.0 lb

## 2019-02-19 DIAGNOSIS — I2510 Atherosclerotic heart disease of native coronary artery without angina pectoris: Secondary | ICD-10-CM | POA: Diagnosis not present

## 2019-02-19 DIAGNOSIS — R0609 Other forms of dyspnea: Secondary | ICD-10-CM

## 2019-02-19 DIAGNOSIS — E785 Hyperlipidemia, unspecified: Secondary | ICD-10-CM

## 2019-02-19 DIAGNOSIS — R002 Palpitations: Secondary | ICD-10-CM | POA: Diagnosis not present

## 2019-02-19 DIAGNOSIS — R0600 Dyspnea, unspecified: Secondary | ICD-10-CM

## 2019-02-19 NOTE — Progress Notes (Signed)
Primary Physician:  Lucianne Lei, MD   Patient ID: Cheyenne Gould, female    DOB: [birth year], 73 y.o.   MRN: [id number]  Subjective:    Chief Complaint  Patient presents with  . Shortness of Breath  . Hyperlipidemia   This visit type was conducted due to national recommendations for restrictions regarding the COVID-19 Pandemic (e.g. social distancing).  This format is felt to be most appropriate for this patient at this time.  All issues noted in this document were discussed and addressed.  No physical exam was performed (except for noted visual exam findings with Telehealth visits).  The patient has consented to conduct a Telehealth visit and understands insurance will be billed.   I discussed the limitations of evaluation and management by telemedicine and the availability of in person appointments. The patient expressed understanding and agreed to proceed.  Virtual Visit via Video Note is as below  I connected with@, on 02/19/19 at 0910 by telephone and verified that I am speaking with the correct person using two identifiers. Unable to perform video visit as patient did not have equipment.    I have discussed with the patient regarding the safety during COVID Pandemic and steps and precautions including social distancing with the patient.    HPI: Cheyenne Gould  is a 73 y.o. 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  Nuclear stress test on 04/26/17 had revealed markedly reduced exercise tolerance, [name], [name]  Recently seen for palpitations and worsening dyspnea. She wore a 48-hour Holter monitor revealing rare PACs. She has also been evaluated by Dr. Rolla Etienne and noted to have pneumonitis vs. RA  ILD on CT of the chest. Medical management was decided as she preferred to avoid bronchoscopy. We decided to hold off on further cardiac evaluation for now to see if her symptoms improve with Symbicort. She now presents for follow up.   She has noticed improvement in her dyspnea with being on symbicort. Has had recent PFT's and is to see Pulmonary in the next few weeks. No exertional chest pain.  She was previously on simvastatin, tolerated 10 mg but not 20 mg. She had recently been tried on Crestor that she did not tolerate due to myalgia.  At her last visit, she was started back on Simvastatin 10 mg, tolerating this well. Does admit that she has not been compliant with her diet. States her recent lipids with PCP were elevated. Palpitations have improved with Metoprolol 50 mg.   Past Medical History:  Diagnosis Date  . Acid reflux   . AKI (acute kidney injury) ([place]) 06/07/2015  . Allergic sinusitis   . Anemia   . CAP (community acquired pneumonia) 06/07/2015  . Chest pain 01/01/2014  . Hyperlipidemia   . Hypertension   . Hypoxia   . Rheumatoid arthritis ([place])    on Leflunomide   . Rheumatoid arthritis(714.0)    on Leflunomide    Past Surgical History:  Procedure Laterality Date  . ABDOMINAL HYSTERECTOMY  1985   Partial  . BUNIONECTOMY  2002,12/18/2006    Social History   Socioeconomic History  . Marital status: Divorced    Spouse name: Not on file  . Number of children: 3  . Years of education: Not  on file  . Highest education level: Not on file  Occupational History  . Not on file  Tobacco Use  . Smoking status: Former Smoker    Packs/day: 1.00    Years: 40.00    Pack years: 40.00    Types: Cigarettes    Quit date: 02/20/2003    Years since quitting: 16.0  . Smokeless tobacco: Never Used  Substance and Sexual Activity  . Alcohol use: No  . Drug use: No  . Sexual activity: Not on file  Other Topics Concern  . Not on file  Social History Narrative  . Not on file     Social Determinants of Health   Financial Resource Strain:   . Difficulty of Paying Living Expenses: Not on file  Food Insecurity:   . Worried About Charity fundraiser in the Last Year: Not on file  . Ran Out of Food in the Last Year: Not on file  Transportation Needs:   . Lack of Transportation (Medical): Not on file  . Lack of Transportation (Non-Medical): Not on file  Physical Activity:   . Days of Exercise per Week: Not on file  . Minutes of Exercise per Session: Not on file  Stress:   . Feeling of Stress : Not on file  Social Connections:   . Frequency of Communication with Friends and Family: Not on file  . Frequency of Social Gatherings with Friends and Family: Not on file  . Attends Religious Services: Not on file  . Active Member of Clubs or Organizations: Not on file  . Attends Archivist Meetings: Not on file  . Marital Status: Not on file  Intimate Partner Violence:   . Fear of Current or Ex-Partner: Not on file  . Emotionally Abused: Not on file  . Physically Abused: Not on file  . Sexually Abused: Not on file    Review of Systems  Constitution: Negative for decreased appetite, malaise/fatigue, weight gain and weight loss.  Eyes: Negative for visual disturbance.  Cardiovascular: Positive for dyspnea on exertion (improved) and palpitations (improved). Negative for chest pain, claudication, leg swelling, orthopnea and syncope.  Respiratory: Negative for hemoptysis and wheezing.   Endocrine: Negative for cold intolerance and heat intolerance.  Hematologic/Lymphatic: Does not bruise/bleed easily.  Skin: Negative for nail changes.  Musculoskeletal: Positive for back pain and joint pain. Negative for muscle weakness and myalgias.  Gastrointestinal: Negative for abdominal pain, change in bowel habit, nausea and vomiting.  Neurological: Negative for difficulty with concentration, dizziness, focal weakness and headaches.  Psychiatric/Behavioral: Negative for  altered mental status and suicidal ideas.  All other systems reviewed and are negative.     Objective:  Blood pressure 123/74, pulse 73, height _0  (1.676 m), weight 194 lb (88 kg). Body mass index is 31.31 kg/m.   Physical exam not performed or limited due to virtual visit.    Please see exam details from prior visit is as below.     Physical Exam  Constitutional: She is oriented to person, place, and time. Vital signs are normal. She appears well-developed and well-nourished.  HENT:  Head: Normocephalic and atraumatic.  Cardiovascular: Normal rate, regular rhythm, normal heart sounds and intact distal pulses.  Pulmonary/Chest: Effort normal and breath sounds normal. No accessory muscle usage. No respiratory distress.  Abdominal: Soft. Bowel sounds are normal.  Musculoskeletal:        General: Normal range of motion.     Cervical back: Normal range of motion.  Neurological:  She is alert and oriented to person, place, and time.  Skin: Skin is warm and dry.  Vitals reviewed.  Radiology:   09/25/2017: CT of the chest, coronary atherosclerosis of LAD and left circumflex. Mild atherosclerotic [place] conditions of the aortic arch. Stable 6 mm nodule in right lower lobe likely benign.  Laboratory examination:  Labs 11/21/2017: Total cholesterol 194, triglycerides 49, HDL 78, LDL 106. Serum glucose 91 mg, BUN 19, creatinine 1.18, eGFR 54 mL, potassium 4.4.   10/04/2017: Troponin less than 0.01.   09/13/2017: Creatinine 1.3, EGFR 41/47, potassium 3.9, CMP otherwise normal. RBC 3.69, hemoglobin 10.8, CBC otherwise normal.  CMP Latest Ref Rng & Units 01/21/2019 06/14/2015 06/13/2015  Glucose 70 - 99 mg/dL 95 107(H) 97  BUN 6 - 23 mg/dL 15 23(H) 26(H)  Creatinine 0.40 - 1.20 mg/dL 1.13 1.10(H) 1.29(H)  Sodium 135 - 145 mEq/L 137 143 140  Potassium 3.5 - 5.1 mEq/L 3.3(L) 3.8 3.9  Chloride 96 - 112 mEq/L 103 111 109  CO2 19 - 32 mEq/L _0 Calcium 8.4 - 10.5 mg/dL 9.4 8.8(L) 8.5(L)  Total  Protein 6.0 - 8.3 g/dL 6.9 - -  Total Bilirubin 0.2 - 1.2 mg/dL 0.4 - -  Alkaline Phos 39 - 117 U/L 111 - -  AST 0 - 37 U/L 16 - -  ALT 0 - 35 U/L 9 - -   CBC Latest Ref Rng & Units 01/21/2019 06/14/2015 06/13/2015  WBC 4.0 - 10.5 K/uL 7.0 16.3(H) 14.8(H)  Hemoglobin 12.0 - 15.0 g/dL 11.2(L) 8.6(L) 7.7(L)  Hematocrit 36.0 - 46.0 % 33.8(L) 25.1(L) 22.2(L)  Platelets 150.0 - 400.0 K/uL 342.0 487(H) 433(H)   Lipid Panel     Component Value Date/Time   CHOL 209 (H) 01/02/2014 0858   TRIG 47 01/02/2014 0858   HDL 78 01/02/2014 0858   CHOLHDL 2.7 01/02/2014 0858   VLDL 9 01/02/2014 0858   LDLCALC 122 (H) 01/02/2014 0858   HEMOGLOBIN A1C Lab Results  Component Value Date   HGBA1C 5.8 (H) 06/08/2015   MPG 120 06/08/2015   TSH No results for input(s): TSH in the last 8760 hours.  PRN Meds:. Medications Discontinued During This Encounter  Medication Reason  . CLARAVIS 40 MG capsule Error   Current Meds  Medication Sig  . acetaminophen (TYLENOL) 500 MG tablet Take 500 mg by mouth every 6 (six) hours as needed for mild pain or headache.   . albuterol (PROAIR HFA) 108 (90 Base) MCG/ACT inhaler Inhale 2 puffs into the lungs every 6 (six) hours as needed for wheezing or shortness of breath.  Marland Kitchen amLODIPine-Valsartan-HCTZ 5-160-12.5 MG TABS Take by mouth daily.   . budesonide-formoterol (SYMBICORT) 160-4.5 MCG/ACT inhaler Inhale 2 puffs into the lungs 2 (two) times daily.  . Cholecalciferol (VITAMIN D3) 2000 units TABS Take by mouth.  . clindamycin (CLEOCIN) 300 MG capsule Take 600 mg by mouth. Two hours prior to dental work.  . clotrimazole-betamethasone (LOTRISONE) cream Apply 1 application topically 2 (two) times daily.  Marland Kitchen EPIPEN 2-PAK 0.3 MG/0.3ML SOAJ injection Inject 0.3 mg into the muscle as needed (for allergic reactions).   . folic acid (FOLVITE) 1 MG tablet 2 TABLET BY MOUTH ONCE DAILY  . golimumab 2 mg/kg in sodium chloride 0.9 % Inject 2 mg/kg into the vein every 8 (eight)  weeks.   . lansoprazole (PREVACID) 15 MG capsule Take 15 mg by mouth daily at 12 noon.  Marland Kitchen levocetirizine (XYZAL) 5 MG tablet Take 5 mg by mouth  at bedtime.   . methotrexate (RHEUMATREX) 2.5 MG tablet 7 tablets once a week  . metoprolol succinate (TOPROL-XL) 50 MG 24 hr tablet Take 1 tablet (50 mg total) by mouth daily. Take with or immediately following a meal.  . simvastatin (ZOCOR) 10 MG tablet Take 1 tablet (10 mg total) by mouth at bedtime.  . valACYclovir (VALTREX) 500 MG tablet Take 500 mg by mouth daily.     Cardiac Studies:   48-hour Holter monitor 11/20/2018: Normal sinus rhythm.  Rare PAC.  No reported symptoms.  Exercise myoview stress 04/26/2017: 1. The patient performed treadmill exercise using a Bruce protocol, completing 4:50 minutes. The patient completed an estimated workload of 4.64 METS, reaching 97% of the maximum predicted heart rate. Peak blood pressure 160/60 mmHg. Stress symptoms included dyspnea. Low exercise capacity. No ischemic changes on stress electrocardiogram. 2. The overall quality of the study is excellent. There is no evidence of abnormal lung activity. Stress and rest SPECT images demonstrate homogeneous tracer distribution throughout the myocardium. Gated SPECT imaging reveals normal myocardial thickening and wall motion. The left ventricular ejection fraction was normal (72%). 3. Low risk study.  Echocardiogram 05/08/2017: Left ventricle cavity is normal in size. Mild concentric hypertrophy of the left ventricle. Normal global wall motion. Doppler evidence of grade I (impaired) diastolic dysfunction, normal LAP. Calculated EF 67%. Moderate (Grade II) mitral regurgitation. Mild tricuspid regurgitation. Estimated pulmonary artery systolic pressure 28 mmHg. Mild pulmonic regurgitation.  Assessment:   Dyspnea on exertion  Coronary artery calcification seen on CT scan  Palpitations  Mild hyperlipidemia  EKG 11/20/2018: Normal sinus rhythm at 62 bpm,  normal axis, T wave inversion in anterior leads, cannot exclude ischemia.  Recommendations:   She has noticed improvement in dyspnea since being on Symbicort; therefore, we will hold off on further cardiac evaluation. If she were to have any worsening symptoms or dyspnea is felt to be out of proportion for her lung issues, will have low threshold to proceed with coronary angiogram. For now will continue with aggressive risk factor modification. Blood pressure is well controlled. Palpitations have been stable with Metoprolol.  Discussed needed dietary changes to help with her weight and also her lipids. She does report that she is seeing her PCP in the next few weeks and that recent lipids have continued to be elevated. She admits to not making needed dietary changes which was extensively discussed. She is tolerating Simvastatin 10 mg, will continue with this. Unable to further increase due to myalgia. I feel that she would benefit from addition of Zetia or Nexlitol; however, she continues to wish to make diet changes first and reevaluate. I will repeat her lipids in 8 weeks and see her back at that time to discuss further management of hyperlipidemia and to also reevaluate her dyspnea.   Miquel Dunn, MSN, APRN, FNP-C [facility]. [place] Office: [phone] Fax: [phone]

## 2019-03-03 ENCOUNTER — Other Ambulatory Visit: Payer: Self-pay | Admitting: Cardiology

## 2019-03-05 DIAGNOSIS — Z91010 Allergy to peanuts: Secondary | ICD-10-CM | POA: Diagnosis not present

## 2019-03-05 DIAGNOSIS — R21 Rash and other nonspecific skin eruption: Secondary | ICD-10-CM | POA: Diagnosis not present

## 2019-03-05 DIAGNOSIS — J301 Allergic rhinitis due to pollen: Secondary | ICD-10-CM | POA: Diagnosis not present

## 2019-03-05 DIAGNOSIS — J3089 Other allergic rhinitis: Secondary | ICD-10-CM | POA: Diagnosis not present

## 2019-03-12 DIAGNOSIS — L400 Psoriasis vulgaris: Secondary | ICD-10-CM | POA: Diagnosis not present

## 2019-03-15 ENCOUNTER — Ambulatory Visit: Payer: Federal, State, Local not specified - PPO | Attending: Internal Medicine

## 2019-03-15 DIAGNOSIS — Z23 Encounter for immunization: Secondary | ICD-10-CM

## 2019-03-16 NOTE — Progress Notes (Signed)
   Covid-19 Vaccination Clinic  Name:  Cheyenne Gould    MRN: [id number] DOB: May 17, 1945  03/15/2019  [name] was observed post Covid-19 immunization for 15 minutes without incidence. She was provided with Vaccine Information Sheet and instruction to access the V-Safe system.   [name] was instructed to call 911 with any severe reactions post vaccine: Marland Kitchen Difficulty breathing  . Swelling of your face and throat  . A fast heartbeat  . A bad rash all over your body  . Dizziness and weakness    Immunizations Administered    Name Date Dose VIS Date Route   Moderna COVID-19 Vaccine 03/15/2019  2:15 PM 0.5 mL 01/20/2019 Intramuscular   Manufacturer: Gala Murdoch   Lot: [id number]   NDC: 14782-956-21      Documented on behalf of: C. Jimmey Ralph

## 2019-03-27 DIAGNOSIS — M0589 Other rheumatoid arthritis with rheumatoid factor of multiple sites: Secondary | ICD-10-CM | POA: Diagnosis not present

## 2019-03-27 DIAGNOSIS — Z79899 Other long term (current) drug therapy: Secondary | ICD-10-CM | POA: Diagnosis not present

## 2019-03-30 ENCOUNTER — Ambulatory Visit: Payer: Medicare Other | Admitting: Pulmonary Disease

## 2019-03-30 ENCOUNTER — Ambulatory Visit: Payer: Medicare Other | Admitting: Primary Care

## 2019-03-31 ENCOUNTER — Encounter: Payer: Self-pay | Admitting: Primary Care

## 2019-03-31 ENCOUNTER — Ambulatory Visit (INDEPENDENT_AMBULATORY_CARE_PROVIDER_SITE_OTHER): Payer: Medicare Other

## 2019-03-31 ENCOUNTER — Other Ambulatory Visit: Payer: Self-pay

## 2019-03-31 ENCOUNTER — Ambulatory Visit (INDEPENDENT_AMBULATORY_CARE_PROVIDER_SITE_OTHER): Payer: Medicare Other | Admitting: Primary Care

## 2019-03-31 VITALS — BP 118/64 | HR 73 | Temp 97.3°F | Ht 66.0 in | Wt 199.2 lb

## 2019-03-31 DIAGNOSIS — J40 Bronchitis, not specified as acute or chronic: Secondary | ICD-10-CM | POA: Diagnosis not present

## 2019-03-31 DIAGNOSIS — E785 Hyperlipidemia, unspecified: Secondary | ICD-10-CM | POA: Diagnosis not present

## 2019-03-31 DIAGNOSIS — M069 Rheumatoid arthritis, unspecified: Secondary | ICD-10-CM | POA: Diagnosis not present

## 2019-03-31 DIAGNOSIS — R05 Cough: Secondary | ICD-10-CM | POA: Diagnosis not present

## 2019-03-31 DIAGNOSIS — J849 Interstitial pulmonary disease, unspecified: Secondary | ICD-10-CM

## 2019-03-31 MED ORDER — DOXYCYCLINE HYCLATE 100 MG PO TABS: 100.0000 mg | ORAL_TABLET | Freq: Two times a day (BID) | ORAL | 0 refills | Status: DC

## 2019-03-31 NOTE — Assessment & Plan Note (Addendum)
-   Receiving Leflunomide infusion q 8 hours - Following with Dr. Dierdre Forth with rheumatology - No recent flares of RA symptoms

## 2019-03-31 NOTE — Assessment & Plan Note (Signed)
-   Reports cough with some purulent mucus  - CXR today showed opacity RML and RLL similar to recent CT, difficult to exclude developing infection. No signs of consolidation or pleural effusion.  - Take mucinex 600 mg twice daily with glass of water - Rx doxycycline 1 tab twice daily x 7 days

## 2019-03-31 NOTE — Progress Notes (Signed)
@Patient  ID: Cheyenne Gould, female    DOB: 1945/03/31, 74 y.o.   MRN: [id number]  Chief Complaint  Patient presents with  . Follow-up    Interstitial pulmonary disease ([place])    Referring provider: Lucianne Lei, MD  HPI: 74 year old female, former smoker quit in 2005 (40-pack-year history).  Past medical history significant for interstitial lung disease, community acquired pneumonia, hypoxia, rheumatoid arthritis, hypertension, acute kidney injury, hyperlipidemia.  Patient of Dr. Vaughan Browner, last seen January 21, 2019.  CT chest showed mild centrilobular nodularity of uncertain etiology.  Measuring suggestive of hypersensitivity pneumonitis but patient is not have any known exposure history.  She does have a history of rheumatoid arthritis so RA ILD is on the differential.  Patient elected not to proceed with bronchoscopy with biopsy for diagnosis.  Started on Symbicort during last visit.  03/31/2019 Patient presents today for a 24-month follow-up.  Her breathing remains at baseline.  She has been compliant with Symbicort taking it twice daily.  She does notice that her breathing is worse on days when she forgets to take her inhaler or takes it later in the day.  She has not needed to use her albuterol rescue inhaler.  Reports getting mucus buildup in her throat at night, there is some color to sputum.  Not currently taking anything for cough.  Receives golimumab infusion every 8 weeks for her RA. Rheumatologist is Dr. Amil Amen. No recent flares.  Allergies  Allergen Reactions  . Peanut-Containing Drug Products Anaphylaxis  . Shellfish Allergy Anaphylaxis  . Allegra [Fexofenadine] Cough  . Lyrica [Pregabalin] Other (See Comments)    Causes rheumatoid flares  . Penicillins Other (See Comments)    Corners of mouth started splitting  . Sulfa Antibiotics Other (See Comments)    unknown  . Leflunomide Rash    Immunization History  Administered Date(s) Administered  . Fluad Quad(high Dose 65+)  10/23/2018  . Influenza, High Dose Seasonal PF 11/10/2016, 12/02/2017  . Influenza-Unspecified 10/20/2013  . Moderna SARS-COVID-2 Vaccination 03/15/2019    Past Medical History:  Diagnosis Date  . Acid reflux   . AKI (acute kidney injury) ([place]) 06/07/2015  . Allergic sinusitis   . Anemia   . CAP (community acquired pneumonia) 06/07/2015  . Chest pain 01/01/2014  . Hyperlipidemia   . Hypertension   . Hypoxia   . Rheumatoid arthritis ([place])    on Leflunomide   . Rheumatoid arthritis(714.0)    on Leflunomide    Tobacco History: Social History   Tobacco Use  Smoking Status Former Smoker  . Packs/day: 1.00  . Years: 40.00  . Pack years: 40.00  . Types: Cigarettes  . Quit date: 02/20/2003  . Years since quitting: 16.1  Smokeless Tobacco Never Used   Counseling given: Not Answered   Outpatient Medications Prior to Visit  Medication Sig Dispense Refill  . acetaminophen (TYLENOL) 500 MG tablet Take 500 mg by mouth every 6 (six) hours as needed for mild pain or headache.     . albuterol (PROAIR HFA) 108 (90 Base) MCG/ACT inhaler Inhale 2 puffs into the lungs every 6 (six) hours as needed for wheezing or shortness of breath. 1 Inhaler 3  . amLODIPine-Valsartan-HCTZ 5-160-12.5 MG TABS Take by mouth daily.     . budesonide-formoterol (SYMBICORT) 160-4.5 MCG/ACT inhaler Inhale 2 puffs into the lungs 2 (two) times daily. 1 Inhaler 2  . Cholecalciferol (VITAMIN D3) 2000 units TABS Take by mouth.    . clindamycin (CLEOCIN) 300 MG capsule Take 600 mg by  mouth. Two hours prior to dental work.    . EPIPEN 2-PAK 0.3 MG/0.3ML SOAJ injection Inject 0.3 mg into the muscle as needed (for allergic reactions).     . folic acid (FOLVITE) 1 MG tablet 2 TABLET BY MOUTH ONCE DAILY  3  . golimumab 2 mg/kg in sodium chloride 0.9 % Inject 2 mg/kg into the vein every 8 (eight) weeks.     . lansoprazole (PREVACID) 15 MG capsule Take 15 mg by mouth daily at 12 noon.    Marland Kitchen levocetirizine (XYZAL) 5 MG tablet  Take 5 mg by mouth at bedtime.     . methotrexate (RHEUMATREX) 2.5 MG tablet 7 tablets once a week  2  . metoprolol succinate (TOPROL-XL) 50 MG 24 hr tablet TAKE 1 TABLET (50 MG TOTAL) BY MOUTH DAILY. TAKE WITH OR IMMEDIATELY FOLLOWING A MEAL. 90 tablet 0  . simvastatin (ZOCOR) 10 MG tablet Take 1 tablet (10 mg total) by mouth at bedtime. 30 tablet 2  . tazarotene (AVAGE) 0.1 % cream APPLY TO AFFECTED AREA EVERY DAY AT NIGHT    . valACYclovir (VALTREX) 500 MG tablet Take 500 mg by mouth daily.     . clotrimazole-betamethasone (LOTRISONE) cream Apply 1 application topically 2 (two) times daily.     No facility-administered medications prior to visit.   Review of Systems  Review of Systems  Constitutional: Negative.   HENT: Positive for postnasal drip.   Respiratory: Positive for cough and shortness of breath. Negative for chest tightness and wheezing.   Cardiovascular: Negative.    Physical Exam  BP 118/64 (BP Location: Left Arm, Patient Position: Sitting, Cuff Size: Normal)   Pulse 73   Temp (!) 97.3 F (36.3 C)   Ht 5\' 6"  (1.676 m)   Wt 199 lb 3.2 oz (90.4 kg)   SpO2 99% Comment: on room air  BMI 32.15 kg/m  Physical Exam Constitutional:      Appearance: Normal appearance.  HENT:     Head: Normocephalic and atraumatic.     Mouth/Throat:     Comments: Deferred d/t masking Cardiovascular:     Rate and Rhythm: Normal rate and regular rhythm.  Pulmonary:     Effort: Pulmonary effort is normal.     Breath sounds: Normal breath sounds. No wheezing or rales.  Musculoskeletal:        General: Normal range of motion.     Cervical back: Normal range of motion and neck supple.  Skin:    General: Skin is warm and dry.  Neurological:     Mental Status: She is alert.  Psychiatric:        Mood and Affect: Mood normal.        Behavior: Behavior normal.        Thought Content: Thought content normal.        Judgment: Judgment normal.      Lab Results:  CBC    Component  Value Date/Time   WBC 7.0 01/21/2019 0956   RBC 3.67 (L) 01/21/2019 0956   HGB 11.2 (L) 01/21/2019 0956   HGB 9.9 (L) 12/23/2012 1356   HCT 33.8 (L) 01/21/2019 0956   HCT 31.3 (L) 12/23/2012 1356   PLT 342.0 01/21/2019 0956   PLT 404 (H) 12/23/2012 1356   MCV 92.0 01/21/2019 0956   MCV 82.6 12/23/2012 1356   MCH 26.7 06/14/2015 0538   MCHC 33.0 01/21/2019 0956   RDW 15.9 (H) 01/21/2019 0956   RDW 16.4 (H) 12/23/2012 1356   LYMPHSABS  1.7 01/21/2019 0956   LYMPHSABS 2.1 12/23/2012 1356   MONOABS 0.5 01/21/2019 0956   MONOABS 0.7 12/23/2012 1356   EOSABS 0.3 01/21/2019 0956   EOSABS 1.2 (H) 12/23/2012 1356   BASOSABS 0.1 01/21/2019 0956   BASOSABS 0.1 12/23/2012 1356    BMET    Component Value Date/Time   NA 137 01/21/2019 0956   NA 142 12/23/2012 1356   K 3.3 (L) 01/21/2019 0956   K 4.2 12/23/2012 1356   CL 103 01/21/2019 0956   CL 104 12/05/2011 0805   CO2 25 01/21/2019 0956   CO2 27 12/23/2012 1356   GLUCOSE 95 01/21/2019 0956   GLUCOSE 89 12/23/2012 1356   GLUCOSE 89 12/05/2011 0805   BUN 15 01/21/2019 0956   BUN 12.2 12/23/2012 1356   CREATININE 1.13 01/21/2019 0956   CREATININE 0.9 12/23/2012 1356   CALCIUM 9.4 01/21/2019 0956   CALCIUM 9.7 12/23/2012 1356   GFRNONAA 50 (L) 06/14/2015 0538   GFRAA 58 (L) 06/14/2015 0538    BNP No results found for: BNP  ProBNP    Component Value Date/Time   PROBNP 18.8 01/01/2014 1759    Imaging: DG Chest 2 View  Result Date: 03/31/2019 CLINICAL DATA:  Congested, cough. EXAM: CHEST - 2 VIEW COMPARISON:  07/05/2015 FINDINGS: Cardiomediastinal contours and hilar structures are unremarkable. Linear opacity in right middle and right lower lobe similar as compared to recent CT. No signs of consolidation or evidence of pleural effusion. No signs of pleural effusion. Visualized skeletal structures are unremarkable. IMPRESSION: Presumed scarring in the right lung base similar to previous studies. Difficult to exclude developing  infection based on comparison with the previous scout from 12/19/2018. No signs of dense consolidation or pleural effusion. Electronically Signed   By: Donzetta Kohut M.D.   On: 03/31/2019 10:16     Assessment & Plan:   ILD (interstitial lung disease) ([facility]) - Unclear etiology. CT imaging suggestive of hypersensitivity pneumonitis, however, patient does not have any known exposure. She does have a history of rheumatoid arthritis, RA-ILD is on the differential but imaging not consistent with this - Repeat serology showed elevated rheumatoid factor and CCP.  ANA negative - Patient deferred bronchoscopy with biopsy for diagnosis due to potential complications - She was started on Symbicort 160 twice daily which has improved her breathing some - FU in 2-3 months with  Rheumatoid arthritis ([facility]) - Receiving Leflunomide infusion q 8 hours - Following with Dr. Dierdre Forth with rheumatology - No recent flares of RA symptoms   Bronchitis - Reports cough with some purulent mucus  - CXR today showed opacity RML and RLL similar to recent CT, difficult to exclude developing infection. No signs of consolidation or pleural effusion.  - Take mucinex 600 mg twice daily with glass of water - Rx doxycycline 1 tab twice daily x 7 days    Glenford Bayley, NP 03/31/2019

## 2019-03-31 NOTE — Progress Notes (Signed)
Please let patient know CXR showed possible developing infection. Sent in doxycycline abx. Let me know if cough not better after taking. Continue mucinex.

## 2019-03-31 NOTE — Assessment & Plan Note (Addendum)
-   Unclear etiology. CT imaging suggestive of hypersensitivity pneumonitis, however, patient does not have any known exposure. She does have a history of rheumatoid arthritis, RA-ILD is on the differential but imaging not consistent with this - Repeat serology showed elevated rheumatoid factor and CCP.  ANA negative - Patient deferred bronchoscopy with biopsy for diagnosis due to potential complications - She was started on Symbicort 160 twice daily which has improved her breathing some - FU in 2-3 months with 

## 2019-03-31 NOTE — Patient Instructions (Addendum)
Recommendations: Continue Symbicort two puffs twice daily Use albuterol/ventolin rescue inhaler every 4-6 hours for breakthrough shortness of breath Flonase nasal spray at bedtime  Continue xyzal at bedtime Take mucinex 600 mg twice daily with glass of water Stay as active as possible   Orders: CXR today  Follow-up: 2-3 months with Dr. Isaiah Serge and 

## 2019-04-01 ENCOUNTER — Telehealth: Payer: Self-pay

## 2019-04-01 LAB — LIPID PANEL
Chol/HDL Ratio: 2.4 ratio (ref 0.0–4.4)
Cholesterol, Total: 202 mg/dL — ABNORMAL HIGH (ref 100–199)
HDL: 85 mg/dL (ref >39)
LDL Chol Calc (NIH): 104 mg/dL — ABNORMAL HIGH (ref 0–99)
Triglycerides: 71 mg/dL (ref 0–149)
VLDL Cholesterol Cal: 13 mg/dL (ref 5–40)

## 2019-04-01 MED ORDER — EZETIMIBE 10 MG PO TABS: 10.0000 mg | ORAL_TABLET | Freq: Every day | ORAL | 2 refills | Status: DC

## 2019-04-01 NOTE — Telephone Encounter (Signed)
-----   Message from Toniann Fail, NP sent at 04/01/2019  8:22 AM EST ----- Let patient know that lipids continue to be elevated, would recommend addition of Zetia.

## 2019-04-01 NOTE — Telephone Encounter (Signed)
Discussed results and starting Zetia. Patient has agreed to start Zetia.

## 2019-04-12 ENCOUNTER — Ambulatory Visit: Payer: Federal, State, Local not specified - PPO | Attending: Internal Medicine

## 2019-04-12 DIAGNOSIS — Z23 Encounter for immunization: Secondary | ICD-10-CM | POA: Insufficient documentation

## 2019-04-12 NOTE — Progress Notes (Signed)
   Covid-19 Vaccination Clinic  Name:  Cheyenne Gould    MRN: [id number] DOB: 1945-04-09  04/12/2019  Cheyenne Gould was observed post Covid-19 immunization for 15 minutes without incidence. She was provided with Vaccine Information Sheet and instruction to access the V-Safe system.   Cheyenne Gould was instructed to call 911 with any severe reactions post vaccine: Marland Kitchen Difficulty breathing  . Swelling of your face and throat  . A fast heartbeat  . A bad rash all over your body  . Dizziness and weakness    Immunizations Administered    Name Date Dose VIS Date Route   Moderna COVID-19 Vaccine 04/12/2019  1:34 PM 0.5 mL 01/20/2019 Intramuscular   Manufacturer: Moderna   Lot: [id number]   NDC: 94496-759-16

## 2019-04-13 ENCOUNTER — Other Ambulatory Visit: Payer: Self-pay | Admitting: Pulmonary Disease

## 2019-04-13 DIAGNOSIS — J849 Interstitial pulmonary disease, unspecified: Secondary | ICD-10-CM

## 2019-04-13 DIAGNOSIS — L4059 Other psoriatic arthropathy: Secondary | ICD-10-CM | POA: Diagnosis not present

## 2019-04-13 DIAGNOSIS — E669 Obesity, unspecified: Secondary | ICD-10-CM | POA: Diagnosis not present

## 2019-04-13 DIAGNOSIS — M25512 Pain in left shoulder: Secondary | ICD-10-CM | POA: Diagnosis not present

## 2019-04-13 DIAGNOSIS — M5136 Other intervertebral disc degeneration, lumbar region: Secondary | ICD-10-CM | POA: Diagnosis not present

## 2019-04-13 DIAGNOSIS — L401 Generalized pustular psoriasis: Secondary | ICD-10-CM | POA: Diagnosis not present

## 2019-04-13 DIAGNOSIS — M150 Primary generalized (osteo)arthritis: Secondary | ICD-10-CM | POA: Diagnosis not present

## 2019-04-13 DIAGNOSIS — Z6832 Body mass index (BMI) 32.0-32.9, adult: Secondary | ICD-10-CM | POA: Diagnosis not present

## 2019-04-13 DIAGNOSIS — M0589 Other rheumatoid arthritis with rheumatoid factor of multiple sites: Secondary | ICD-10-CM | POA: Diagnosis not present

## 2019-04-13 DIAGNOSIS — R0602 Shortness of breath: Secondary | ICD-10-CM | POA: Diagnosis not present

## 2019-04-14 ENCOUNTER — Ambulatory Visit: Payer: Medicare Other

## 2019-04-15 ENCOUNTER — Other Ambulatory Visit: Payer: Self-pay | Admitting: Cardiology

## 2019-04-16 ENCOUNTER — Other Ambulatory Visit: Payer: Self-pay

## 2019-04-16 ENCOUNTER — Ambulatory Visit: Payer: Federal, State, Local not specified - PPO | Admitting: Cardiology

## 2019-04-16 ENCOUNTER — Encounter: Payer: Self-pay | Admitting: Cardiology

## 2019-04-16 VITALS — BP 116/70 | HR 77 | Temp 99.0°F | Ht 66.0 in | Wt 200.0 lb

## 2019-04-16 DIAGNOSIS — R0600 Dyspnea, unspecified: Secondary | ICD-10-CM

## 2019-04-16 DIAGNOSIS — R002 Palpitations: Secondary | ICD-10-CM | POA: Diagnosis not present

## 2019-04-16 DIAGNOSIS — J849 Interstitial pulmonary disease, unspecified: Secondary | ICD-10-CM

## 2019-04-16 DIAGNOSIS — R0609 Other forms of dyspnea: Secondary | ICD-10-CM

## 2019-04-16 DIAGNOSIS — E785 Hyperlipidemia, unspecified: Secondary | ICD-10-CM | POA: Diagnosis not present

## 2019-04-16 DIAGNOSIS — I2510 Atherosclerotic heart disease of native coronary artery without angina pectoris: Secondary | ICD-10-CM

## 2019-04-16 NOTE — Progress Notes (Signed)
Primary Physician:  Lucianne Lei, MD   Patient ID: Cheyenne Gould, female    DOB: March 15, 1945, 74 y.o.   MRN: [id number]  Subjective:    Chief Complaint  Patient presents with  . Coronary Artery Disease    lipid results  . Hyperlipidemia    HPI: Cheyenne Gould  is a 74 y.o. 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 Vaughan Browner, and 45 pack year history.  Nuclear stress test on 04/26/17 had revealed markedly reduced exercise tolerance, [name], [name]  I have been seeing the patient frequently due to dyspnea on exertion, we had decided to defer further cardiac evaluation to see if possible pulmonary etiology was contributing to her dyspnea.  Since being on Symbicort she has had improvement in her symptoms.  She now presents for 8-week follow-up.  During the interim, her lipids were noted to be elevated despite being back on simvastatin.  She has not tolerated Crestor in the past.  She was started on Zetia.  Palpitations have improved with Metoprolol 50 mg.   Past Medical History:  Diagnosis Date  . Acid reflux   . AKI (acute kidney injury) ([place]) 06/07/2015  . Allergic sinusitis   . Anemia   . CAP (community acquired pneumonia) 06/07/2015  . Chest pain 01/01/2014  . Hyperlipidemia   . Hypertension   . Hypoxia   . Rheumatoid arthritis ([place])    on Leflunomide   . Rheumatoid arthritis(714.0)    on Leflunomide    Past Surgical History:  Procedure Laterality Date  . ABDOMINAL HYSTERECTOMY  1985   Partial  . BUNIONECTOMY  2002,12/18/2006    Social History   Socioeconomic History  . Marital status: Divorced    Spouse name: Not on file  . Number of children: 3  . Years of education: Not on file   . Highest education level: Not on file  Occupational History  . Not on file  Tobacco Use  . Smoking status: Former Smoker    Packs/day: 1.00    Years: 40.00    Pack years: 40.00    Types: Cigarettes    Quit date: 02/20/2003    Years since quitting: 16.1  . Smokeless tobacco: Never Used  Substance and Sexual Activity  . Alcohol use: No  . Drug use: No  . Sexual activity: Not on file  Other Topics Concern  . Not on file  Social History Narrative  . Not on file   Social Determinants of Health   Financial Resource Strain:   . Difficulty of Paying Living Expenses: Not on file  Food Insecurity:   . Worried About Charity fundraiser in the Last Year: Not on file  . Ran Out of Food in the Last Year: Not on file  Transportation Needs:   . Lack of Transportation (Medical): Not on file  . Lack of Transportation (Non-Medical): Not on file  Physical Activity:   . Days of Exercise per Week: Not on file  . Minutes of Exercise per Session: Not on file  Stress:   . Feeling of Stress : Not on file  Social Connections:   . Frequency of Communication with Friends and Family: Not on file  . Frequency of Social Gatherings with Friends and Family:  Not on file  . Attends Religious Services: Not on file  . Active Member of Clubs or Organizations: Not on file  . Attends Archivist Meetings: Not on file  . Marital Status: Not on file  Intimate Partner Violence:   . Fear of Current or Ex-Partner: Not on file  . Emotionally Abused: Not on file  . Physically Abused: Not on file  . Sexually Abused: Not on file    Review of Systems  Constitution: Negative for decreased appetite, malaise/fatigue, weight gain and weight loss.  Eyes: Negative for visual disturbance.  Cardiovascular: Positive for dyspnea on exertion (improved) and palpitations (improved). Negative for chest pain, claudication, leg swelling, orthopnea and syncope.  Respiratory: Negative for hemoptysis and wheezing.     Endocrine: Negative for cold intolerance and heat intolerance.  Hematologic/Lymphatic: Does not bruise/bleed easily.  Skin: Negative for nail changes.  Musculoskeletal: Positive for back pain and joint pain. Negative for muscle weakness and myalgias.  Gastrointestinal: Negative for abdominal pain, change in bowel habit, nausea and vomiting.  Neurological: Negative for difficulty with concentration, dizziness, focal weakness and headaches.  Psychiatric/Behavioral: Negative for altered mental status and suicidal ideas.  All other systems reviewed and are negative.     Objective:  Blood pressure 116/70, pulse 77, temperature 99 F (37.2 C), height 5' 6"  (1.676 m), weight 200 lb (90.7 kg), SpO2 97 %. Body mass index is 32.28 kg/m.      Physical Exam  Constitutional: She is oriented to person, place, and time. Vital signs are normal. She appears well-developed and well-nourished.  HENT:  Head: Normocephalic and atraumatic.  Cardiovascular: Normal rate, regular rhythm, normal heart sounds and intact distal pulses.  Pulmonary/Chest: Effort normal and breath sounds normal. No accessory muscle usage. No respiratory distress.  Abdominal: Soft. Bowel sounds are normal.  Musculoskeletal:        General: Normal range of motion.     Cervical back: Normal range of motion.  Neurological: She is alert and oriented to person, place, and time.  Skin: Skin is warm and dry.  Vitals reviewed.  Radiology:   09/25/2017: CT of the chest, coronary atherosclerosis of LAD and left circumflex. Mild atherosclerotic [place] conditions of the aortic arch. Stable 6 mm nodule in right lower lobe likely benign.  Laboratory examination:  Labs 11/21/2017: Total cholesterol 194, triglycerides 49, HDL 78, LDL 106. Serum glucose 91 mg, BUN 19, creatinine 1.18, eGFR 54 mL, potassium 4.4.   10/04/2017: Troponin less than 0.01.   09/13/2017: Creatinine 1.3, EGFR 41/47, potassium 3.9, CMP otherwise normal. RBC 3.69, hemoglobin  10.8, CBC otherwise normal.  CMP Latest Ref Rng & Units 01/21/2019 06/14/2015 06/13/2015  Glucose 70 - 99 mg/dL 95 107(H) 97  BUN 6 - 23 mg/dL 15 23(H) 26(H)  Creatinine 0.40 - 1.20 mg/dL 1.13 1.10(H) 1.29(H)  Sodium 135 - 145 mEq/L 137 143 140  Potassium 3.5 - 5.1 mEq/L 3.3(L) 3.8 3.9  Chloride 96 - 112 mEq/L 103 111 109  CO2 19 - 32 mEq/L 25 24 22   Calcium 8.4 - 10.5 mg/dL 9.4 8.8(L) 8.5(L)  Total Protein 6.0 - 8.3 g/dL 6.9 - -  Total Bilirubin 0.2 - 1.2 mg/dL 0.4 - -  Alkaline Phos 39 - 117 U/L 111 - -  AST 0 - 37 U/L 16 - -  ALT 0 - 35 U/L 9 - -   CBC Latest Ref Rng & Units 01/21/2019 06/14/2015 06/13/2015  WBC 4.0 - 10.5 K/uL 7.0 16.3(H) 14.8(H)  Hemoglobin 12.0 -  15.0 g/dL 11.2(L) 8.6(L) 7.7(L)  Hematocrit 36.0 - 46.0 % 33.8(L) 25.1(L) 22.2(L)  Platelets 150.0 - 400.0 K/uL 342.0 487(H) 433(H)   Lipid Panel     Component Value Date/Time   CHOL 202 (H) 03/31/2019 1037   TRIG 71 03/31/2019 1037   HDL 85 03/31/2019 1037   CHOLHDL 2.4 03/31/2019 1037   CHOLHDL 2.7 01/02/2014 0858   VLDL 9 01/02/2014 0858   LDLCALC 104 (H) 03/31/2019 1037   HEMOGLOBIN A1C Lab Results  Component Value Date   HGBA1C 5.8 (H) 06/08/2015   MPG 120 06/08/2015   TSH No results for input(s): TSH in the last 8760 hours.  PRN Meds:. Medications Discontinued During This Encounter  Medication Reason  . doxycycline (VIBRA-TABS) 100 MG tablet Error  . amLODIPine-Valsartan-HCTZ 5-160-12.5 MG TABS Error   Current Meds  Medication Sig  . acetaminophen (TYLENOL) 500 MG tablet Take 500 mg by mouth every 6 (six) hours as needed for mild pain or headache.   . albuterol (PROAIR HFA) 108 (90 Base) MCG/ACT inhaler Inhale 2 puffs into the lungs every 6 (six) hours as needed for wheezing or shortness of breath.  . betamethasone dipropionate 0.05 % cream Apply topically 2 (two) times daily.  . Cholecalciferol (VITAMIN D3) 2000 units TABS Take by mouth.  . clindamycin (CLEOCIN) 300 MG capsule Take 600 mg by  mouth. Two hours prior to dental work.  . EPIPEN 2-PAK 0.3 MG/0.3ML SOAJ injection Inject 0.3 mg into the muscle as needed (for allergic reactions).   . ezetimibe (ZETIA) 10 MG tablet Take 1 tablet (10 mg total) by mouth daily.  . folic acid (FOLVITE) 1 MG tablet 2 TABLET BY MOUTH ONCE DAILY  . golimumab 2 mg/kg in sodium chloride 0.9 % Inject 2 mg/kg into the vein every 8 (eight) weeks.   Marland Kitchen levocetirizine (XYZAL) 5 MG tablet Take 5 mg by mouth at bedtime.   . methotrexate (RHEUMATREX) 2.5 MG tablet 7 tablets once a week  . metoprolol succinate (TOPROL-XL) 50 MG 24 hr tablet TAKE 1 TABLET (50 MG TOTAL) BY MOUTH DAILY. TAKE WITH OR IMMEDIATELY FOLLOWING A MEAL.  Marland Kitchen SYMBICORT 160-4.5 MCG/ACT inhaler TAKE 2 PUFFS BY MOUTH TWICE A DAY  . tazarotene (AVAGE) 0.1 % cream APPLY TO AFFECTED AREA EVERY DAY AT NIGHT  . valACYclovir (VALTREX) 500 MG tablet Take 500 mg by mouth daily.     Cardiac Studies:   48-hour Holter monitor 11/20/2018: Normal sinus rhythm.  Rare PAC.  No reported symptoms.  Exercise myoview stress 04/26/2017: 1. The patient performed treadmill exercise using a Bruce protocol, completing 4:50 minutes. The patient completed an estimated workload of 4.64 METS, reaching 97% of the maximum predicted heart rate. Peak blood pressure 160/60 mmHg. Stress symptoms included dyspnea. Low exercise capacity. No ischemic changes on stress electrocardiogram. 2. The overall quality of the study is excellent. There is no evidence of abnormal lung activity. Stress and rest SPECT images demonstrate homogeneous tracer distribution throughout the myocardium. Gated SPECT imaging reveals normal myocardial thickening and wall motion. The left ventricular ejection fraction was normal (72%). 3. Low risk study.  Echocardiogram 05/08/2017: Left ventricle cavity is normal in size. Mild concentric hypertrophy of the left ventricle. Normal global wall motion. Doppler evidence of grade I (impaired) diastolic  dysfunction, normal LAP. Calculated EF 67%. Moderate (Grade II) mitral regurgitation. Mild tricuspid regurgitation. Estimated pulmonary artery systolic pressure 28 mmHg. Mild pulmonic regurgitation.  Assessment:   Coronary artery calcification seen on CT scan - Plan: EKG 12-Lead  Dyspnea on exertion - Plan: PCV ECHOCARDIOGRAM COMPLETE  Palpitations  Mild hyperlipidemia - Plan: Lipid panel, Lipid panel  ILD (interstitial lung disease) ([place])  EKG 04/16/2019: Normal sinus rhythm at 73 bpm, normal axis, IRBBB. Poor quality EKG due to baseline artifact.   EKG 11/20/2018: Normal sinus rhythm at 62 bpm, normal axis, T wave inversion in anterior leads, cannot exclude ischemia.  Recommendations:    Cheyenne Gould  is a 74 y.o. 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 Vaughan Browner, and 45 pack year history.  Patient is here for follow-up for dyspnea on exertion and hyperlipidemia.  She is recently been started on Zetia given that her lipids were not well controlled with simvastatin 10 mg.  Unable to further increase her simvastatin due to myalgias symptoms.  She also had myalgias symptoms with Crestor.  Would recommend LDL of less than 70 given her coronary artery calcification seen on CT scan. Will repeat Lipid panel in 8 weeks.  She does continue to have dyspnea on exertion that is overall stable.  No exertional chest pain.  She certainly has several risk factors for CAD, although has had negative nuclear stress test in 2019, would have low threshold to pursue coronary angiogram.  Her dyspnea on exertion also potentially related to her interstitial lung disease and I recommended that she continue to follow-up with pulmonary for management of this.  If she does continue to have dyspnea that is at a proportion for her  interstitial lung disease or has any worsening dyspnea, will then consider further cardiac work-up. Would recommend continued aggressive medical management. I will arrange for repeat echocardiogram to exclude any structural changes. Given her RA, pulmonary hypertension should be considered.  Her palpitations have been improved since being on metoprolol and will continue the same.  Blood pressure is well controlled.  We will plan to see her back in 3 months for follow-up, but encouraged her to contact me sooner if needed.  Miquel Dunn, MSN, APRN, FNP-C [facility]. [place] Office: [phone] Fax: [phone]

## 2019-05-04 ENCOUNTER — Ambulatory Visit: Payer: Federal, State, Local not specified - PPO

## 2019-05-04 ENCOUNTER — Other Ambulatory Visit: Payer: Self-pay

## 2019-05-04 DIAGNOSIS — R0600 Dyspnea, unspecified: Secondary | ICD-10-CM

## 2019-05-04 DIAGNOSIS — R0609 Other forms of dyspnea: Secondary | ICD-10-CM

## 2019-05-27 ENCOUNTER — Other Ambulatory Visit: Payer: Self-pay | Admitting: Cardiology

## 2019-05-28 DIAGNOSIS — M130 Polyarthritis, unspecified: Secondary | ICD-10-CM | POA: Diagnosis not present

## 2019-05-28 DIAGNOSIS — I10 Essential (primary) hypertension: Secondary | ICD-10-CM | POA: Diagnosis not present

## 2019-05-28 DIAGNOSIS — E785 Hyperlipidemia, unspecified: Secondary | ICD-10-CM | POA: Diagnosis not present

## 2019-05-28 DIAGNOSIS — R634 Abnormal weight loss: Secondary | ICD-10-CM | POA: Diagnosis not present

## 2019-05-29 DIAGNOSIS — M0589 Other rheumatoid arthritis with rheumatoid factor of multiple sites: Secondary | ICD-10-CM | POA: Diagnosis not present

## 2019-05-29 DIAGNOSIS — Z79899 Other long term (current) drug therapy: Secondary | ICD-10-CM | POA: Diagnosis not present

## 2019-06-03 ENCOUNTER — Ambulatory Visit
Admission: RE | Admit: 2019-06-03 | Discharge: 2019-06-03 | Disposition: A | Discharge status: Home / Self Care | Payer: Federal, State, Local not specified - PPO | Source: Ambulatory Visit | Attending: Pulmonary Disease | Admitting: Pulmonary Disease

## 2019-06-03 ENCOUNTER — Other Ambulatory Visit: Payer: Self-pay

## 2019-06-03 DIAGNOSIS — R918 Other nonspecific abnormal finding of lung field: Secondary | ICD-10-CM | POA: Diagnosis not present

## 2019-06-11 DIAGNOSIS — L403 Pustulosis palmaris et plantaris: Secondary | ICD-10-CM | POA: Diagnosis not present

## 2019-06-23 DIAGNOSIS — E785 Hyperlipidemia, unspecified: Secondary | ICD-10-CM | POA: Diagnosis not present

## 2019-06-24 ENCOUNTER — Other Ambulatory Visit: Payer: Self-pay | Admitting: Cardiology

## 2019-06-24 LAB — LIPID PANEL
Chol/HDL Ratio: 2.6 ratio (ref 0.0–4.4)
Cholesterol, Total: 209 mg/dL — ABNORMAL HIGH (ref 100–199)
HDL: 79 mg/dL (ref >39)
LDL Chol Calc (NIH): 120 mg/dL — ABNORMAL HIGH (ref 0–99)
Triglycerides: 56 mg/dL (ref 0–149)
VLDL Cholesterol Cal: 10 mg/dL (ref 5–40)

## 2019-06-24 NOTE — Progress Notes (Signed)
Cholesterol is still not controlled. WIll need to increase Simvastatin dose along with continued Zetia. Discuss on OV

## 2019-07-03 ENCOUNTER — Ambulatory Visit (HOSPITAL_COMMUNITY)
Admission: EM | Admit: 2019-07-03 | Discharge: 2019-07-03 | Disposition: A | Discharge status: Home / Self Care | Payer: Federal, State, Local not specified - PPO | Attending: Family Medicine | Admitting: Family Medicine

## 2019-07-03 ENCOUNTER — Other Ambulatory Visit: Payer: Self-pay | Admitting: Cardiology

## 2019-07-03 ENCOUNTER — Other Ambulatory Visit: Payer: Self-pay

## 2019-07-03 ENCOUNTER — Encounter (HOSPITAL_COMMUNITY): Payer: Self-pay

## 2019-07-03 DIAGNOSIS — K047 Periapical abscess without sinus: Secondary | ICD-10-CM

## 2019-07-03 MED ORDER — CLINDAMYCIN HCL 300 MG PO CAPS: 300.0000 mg | ORAL_CAPSULE | Freq: Three times a day (TID) | ORAL | 0 refills | Status: AC

## 2019-07-03 MED ORDER — IBUPROFEN 600 MG PO TABS
600.0000 mg | ORAL_TABLET | Freq: Four times a day (QID) | ORAL | 0 refills | Status: DC | PRN
Start: 2019-07-03 — End: 2020-06-06

## 2019-07-03 NOTE — ED Provider Notes (Signed)
[facility]    CSN: [id number] Arrival date & time: 07/03/19  1114      History   Chief Complaint Chief Complaint  Patient presents with  . Facial Swelling    HPI Cheyenne Gould is a 74 y.o. female history of hypertension, hyperlipidemia, GERD, presenting today for evaluation of facial swelling.  Patient notes that over the past 3 to 4 days she has developed increased pain and swelling to her left upper jaw.  States that this began after she had gone to the dentist and had impressions to obtain partials.  She denies any effect on her vision or eye pain.  Denies any difficulty breathing or shortness of breath.  Denies difficulty swallowing.  She has been using Tylenol without relief. Denies fevers.   HPI  Past Medical History:  Diagnosis Date  . Acid reflux   . AKI (acute kidney injury) ([facility]) 06/07/2015  . Allergic sinusitis   . Anemia   . CAP (community acquired pneumonia) 06/07/2015  . Chest pain 01/01/2014  . Hyperlipidemia   . Hypertension   . Hypoxia   . Rheumatoid arthritis ([facility])    on Leflunomide   . Rheumatoid arthritis(714.0)    on Leflunomide    Patient Active Problem List   Diagnosis Date Noted  . ILD (interstitial lung disease) ([facility]) 03/31/2019  . Bronchitis 03/31/2019    Class: Acute  . Palpitations 08/16/2016  . Hypoxia   . Sepsis due to pneumonia ([facility]) 06/08/2015  . Hypotension 06/08/2015  . CAP (community acquired pneumonia) 06/07/2015  . AKI (acute kidney injury) ([facility]) 06/07/2015  . Chest pain 01/01/2014  . Hypertension   . Rheumatoid arthritis ([facility])   . Hyperlipidemia   . Anemia     Past Surgical History:  Procedure Laterality Date  . ABDOMINAL HYSTERECTOMY  1985   Partial  . BUNIONECTOMY  2002,12/18/2006    OB History   No obstetric history on file.      Home Medications    Prior to Admission medications   Medication Sig Start Date End Date Taking? Authorizing Provider  metoprolol succinate (TOPROL-XL) 50 MG 24 hr  tablet TAKE 1 TABLET (50 MG TOTAL) BY MOUTH DAILY. TAKE WITH OR IMMEDIATELY FOLLOWING A MEAL. 05/27/19 08/25/19  Toniann Fail, NP  acetaminophen (TYLENOL) 500 MG tablet Take 500 mg by mouth every 6 (six) hours as needed for mild pain or headache.     [provider]  albuterol (PROAIR HFA) 108 (90 Base) MCG/ACT inhaler Inhale 2 puffs into the lungs every 6 (six) hours as needed for wheezing or shortness of breath. 06/28/17   Mannam, Colbert Coyer, MD  amLODipine (NORVASC) 5 MG tablet Take 5 mg by mouth daily. 04/06/19   [provider]  betamethasone dipropionate 0.05 % cream Apply topically 2 (two) times daily.    [provider]  Cholecalciferol (VITAMIN D3) 2000 units TABS Take by mouth.    [provider]  clindamycin (CLEOCIN) 300 MG capsule Take 1 capsule (300 mg total) by mouth 3 (three) times daily for 7 days. 07/03/19 07/10/19  Laddie Math C, PA-C  EPIPEN 2-PAK 0.3 MG/0.3ML SOAJ injection Inject 0.3 mg into the muscle as needed (for allergic reactions).  06/14/14   [provider]  ezetimibe (ZETIA) 10 MG tablet TAKE 1 TABLET BY MOUTH EVERY DAY 06/24/19   Tolia, Sunit, DO  folic acid (FOLVITE) 1 MG tablet 2 TABLET BY MOUTH ONCE DAILY 08/13/14   [provider]  golimumab 2 mg/kg in  sodium chloride 0.9 % Inject 2 mg/kg into the vein every 8 (eight) weeks.     [provider]  ibuprofen (ADVIL) 600 MG tablet Take 1 tablet (600 mg total) by mouth every 6 (six) hours as needed. 07/03/19   Abbeygail Igoe C, PA-C  lansoprazole (PREVACID) 15 MG capsule Take 15 mg by mouth daily at 12 noon.    [provider]  levocetirizine (XYZAL) 5 MG tablet Take 5 mg by mouth at bedtime.  05/21/12   [provider]  methotrexate (RHEUMATREX) 2.5 MG tablet 7 tablets once a week 04/19/17   [provider]  simvastatin (ZOCOR) 10 MG tablet TAKE 1 TABLET BY MOUTH EVERYDAY AT BEDTIME 04/16/19   Miquel Dunn, NP  SYMBICORT 160-4.5  MCG/ACT inhaler TAKE 2 PUFFS BY MOUTH TWICE A DAY 04/13/19   Mannam, Praveen, MD  tazarotene (AVAGE) 0.1 % cream APPLY TO AFFECTED AREA EVERY DAY AT NIGHT 03/12/19   [provider]  valACYclovir (VALTREX) 500 MG tablet Take 500 mg by mouth daily.     [provider]  valsartan-hydrochlorothiazide (DIOVAN-HCT) 160-12.5 MG tablet Take 1 tablet by mouth daily. 04/06/19   [provider]    Family History Family History  Problem Relation Age of Onset  . Heart disease Father   . Hypertension Sister   . Lupus Brother   . Cancer Brother        Colon    Social History Social History   Tobacco Use  . Smoking status: Former Smoker    Packs/day: 1.00    Years: 40.00    Pack years: 40.00    Types: Cigarettes    Quit date: 02/20/2003    Years since quitting: 16.3  . Smokeless tobacco: Never Used  Substance Use Topics  . Alcohol use: No  . Drug use: No     Allergies   Peanut-containing drug products, Shellfish allergy, Penicillins, Sulfa antibiotics, Allegra [fexofenadine], Leflunomide, and Lyrica [pregabalin]   Review of Systems Review of Systems  Constitutional: Negative for activity change, appetite change, chills, fatigue and fever.  HENT: Positive for facial swelling. Negative for congestion, ear pain, rhinorrhea, sinus pressure, sore throat and trouble swallowing.   Eyes: Negative for discharge and redness.  Respiratory: Negative for cough, chest tightness and shortness of breath.   Cardiovascular: Negative for chest pain.  Gastrointestinal: Negative for abdominal pain, diarrhea, nausea and vomiting.  Musculoskeletal: Negative for myalgias.  Skin: Negative for rash.  Neurological: Negative for dizziness, light-headedness and headaches.     Physical Exam Triage Vital Signs ED Triage Vitals  Enc Vitals Group     BP 07/03/19 1237 (!) 145/84     Pulse Rate 07/03/19 1237 74     Resp 07/03/19 1237 17     Temp 07/03/19 1237 98.1 F (36.7 C)      Temp Source 07/03/19 1237 Oral     SpO2 07/03/19 1237 96 %     Weight --      Height --      Head Circumference --      Peak Flow --      Pain Score 07/03/19 1235 6     Pain Loc --      Pain Edu? --      Excl. in [place]? --    No data found.  Updated Vital Signs BP (!) 145/84 (BP Location: Right Arm)   Pulse 74   Temp 98.1 F (36.7 C) (Oral)   Resp 17  SpO2 96%   Visual Acuity Right Eye Distance:   Left Eye Distance:   Bilateral Distance:    Right Eye Near:   Left Eye Near:    Bilateral Near:     Physical Exam Vitals and nursing note reviewed.  Constitutional:      Appearance: She is well-developed.     Comments: No acute distress  HENT:     Head: Normocephalic and atraumatic.     Comments: Left maxillary area with swelling with slight erythema, tender to palpation and feels firm    Nose: Nose normal.     Comments: Bilateral nares patent    Mouth/Throat:     Comments: Oral mucosa pink and moist, no tonsillar enlargement or exudate. Posterior pharynx patent and nonerythematous, no uvula deviation or swelling. Normal phonation.  Gingival erythema and tenderness noted to left upper jaw Eyes:     Extraocular Movements: Extraocular movements intact.     Conjunctiva/sclera: Conjunctivae normal.     Pupils: Pupils are equal, round, and reactive to light.     Comments: No periorbital swelling  Neck:     Comments: No neck swelling or erythema, full active range of motion of neck, no lymphadenopathy Cardiovascular:     Rate and Rhythm: Normal rate.  Pulmonary:     Effort: Pulmonary effort is normal. No respiratory distress.  Abdominal:     General: There is no distension.  Musculoskeletal:        General: Normal range of motion.     Cervical back: Neck supple.  Skin:    General: Skin is warm and dry.  Neurological:     Mental Status: She is alert and oriented to person, place, and time.      UC Treatments / Results  Labs (all labs ordered are listed, but only  abnormal results are displayed) Labs Reviewed - No data to display  EKG   Radiology No results found.  Procedures Procedures (including critical care time)  Medications Ordered in UC Medications - No data to display  Initial Impression / Assessment and Plan / UC Course  I have reviewed the triage vital signs and the nursing notes.  Pertinent labs & imaging results that were available during my care of the patient were reviewed by me and considered in my medical decision making (see chart for details).     Exam and history highly suggestive of dental abscess, has allergies to penicillins and sulfa, initiating on clindamycin, warm compresses.  Discussed risk of diarrhea/C. difficile with this antibiotic and recommended to use activity a yogurt for prevention, follow-up if any loose stools not resolving after completion of antibiotic.  Ibuprofen added with Tylenol temporarily for pain, warm compresses, if not improving or worsening to follow-up in emergency room.  Discussed strict return precautions. Patient verbalized understanding and is agreeable with plan.  Final Clinical Impressions(s) / UC Diagnoses   Final diagnoses:  Dental abscess     Discharge Instructions     Begin clindamycin to treat dental infection/abscess Warm compresses for swelling Use anti-inflammatories for pain/swelling. You may take up to 600 mg Ibuprofen every 8 hours with food. You may supplement Ibuprofen with Tylenol [phone] mg every 8 hours.  Active yogurt 1-2 times daily to prevent diarrhea associated with antibiotic use Follow up if any symptoms not improving or worsening   ED Prescriptions    Medication Sig Dispense Auth. Provider   clindamycin (CLEOCIN) 300 MG capsule Take 1 capsule (300 mg total) by mouth 3 (three) times  daily for 7 days. 21 capsule Brentley Horrell C, PA-C   ibuprofen (ADVIL) 600 MG tablet Take 1 tablet (600 mg total) by mouth every 6 (six) hours as needed. 30 tablet Brunilda Eble,  [place] C, PA-C     PDMP not reviewed this encounter.   Lew Dawes, PA-C 07/03/19 1250

## 2019-07-03 NOTE — Discharge Instructions (Signed)
Begin clindamycin to treat dental infection/abscess Warm compresses for swelling Use anti-inflammatories for pain/swelling. You may take up to 600 mg Ibuprofen every 8 hours with food. You may supplement Ibuprofen with Tylenol [id number] mg every 8 hours.  Active yogurt 1-2 times daily to prevent diarrhea associated with antibiotic use Follow up if any symptoms not improving or worsening

## 2019-07-03 NOTE — ED Triage Notes (Signed)
Pt presents with left side facial swelling and pain; pt states she went to the dentist on Tuesday to get dental impressions for her partials and by the evening her face started swelling.

## 2019-07-13 DIAGNOSIS — M25512 Pain in left shoulder: Secondary | ICD-10-CM | POA: Diagnosis not present

## 2019-07-13 DIAGNOSIS — Z6832 Body mass index (BMI) 32.0-32.9, adult: Secondary | ICD-10-CM | POA: Diagnosis not present

## 2019-07-13 DIAGNOSIS — M150 Primary generalized (osteo)arthritis: Secondary | ICD-10-CM | POA: Diagnosis not present

## 2019-07-13 DIAGNOSIS — M5136 Other intervertebral disc degeneration, lumbar region: Secondary | ICD-10-CM | POA: Diagnosis not present

## 2019-07-13 DIAGNOSIS — R0602 Shortness of breath: Secondary | ICD-10-CM | POA: Diagnosis not present

## 2019-07-13 DIAGNOSIS — L401 Generalized pustular psoriasis: Secondary | ICD-10-CM | POA: Diagnosis not present

## 2019-07-13 DIAGNOSIS — M0589 Other rheumatoid arthritis with rheumatoid factor of multiple sites: Secondary | ICD-10-CM | POA: Diagnosis not present

## 2019-07-13 DIAGNOSIS — E669 Obesity, unspecified: Secondary | ICD-10-CM | POA: Diagnosis not present

## 2019-07-13 DIAGNOSIS — L4059 Other psoriatic arthropathy: Secondary | ICD-10-CM | POA: Diagnosis not present

## 2019-07-15 ENCOUNTER — Ambulatory Visit: Payer: Medicare Other | Admitting: Cardiology

## 2019-07-23 ENCOUNTER — Other Ambulatory Visit: Payer: Self-pay

## 2019-07-23 ENCOUNTER — Encounter: Payer: Self-pay | Admitting: Cardiology

## 2019-07-23 ENCOUNTER — Ambulatory Visit: Payer: Federal, State, Local not specified - PPO | Admitting: Cardiology

## 2019-07-23 VITALS — BP 121/73 | HR 70 | Resp 16 | Ht 66.0 in | Wt 198.4 lb

## 2019-07-23 DIAGNOSIS — I2510 Atherosclerotic heart disease of native coronary artery without angina pectoris: Secondary | ICD-10-CM

## 2019-07-23 DIAGNOSIS — E6609 Other obesity due to excess calories: Secondary | ICD-10-CM | POA: Diagnosis not present

## 2019-07-23 DIAGNOSIS — R0609 Other forms of dyspnea: Secondary | ICD-10-CM | POA: Diagnosis not present

## 2019-07-23 DIAGNOSIS — E782 Mixed hyperlipidemia: Secondary | ICD-10-CM

## 2019-07-23 DIAGNOSIS — Z712 Person consulting for explanation of examination or test findings: Secondary | ICD-10-CM

## 2019-07-23 DIAGNOSIS — I10 Essential (primary) hypertension: Secondary | ICD-10-CM | POA: Diagnosis not present

## 2019-07-23 DIAGNOSIS — R0600 Dyspnea, unspecified: Secondary | ICD-10-CM

## 2019-07-23 DIAGNOSIS — J849 Interstitial pulmonary disease, unspecified: Secondary | ICD-10-CM

## 2019-07-23 DIAGNOSIS — Z6832 Body mass index (BMI) 32.0-32.9, adult: Secondary | ICD-10-CM | POA: Diagnosis not present

## 2019-07-23 MED ORDER — NEXLIZET 180-10 MG PO TABS: 1.0000 | ORAL_TABLET | Freq: Every day | ORAL | 1 refills | Status: DC

## 2019-07-23 NOTE — Progress Notes (Signed)
Cheyenne Gould Date of Birth: [birth year] MRN: [id number] Primary Care Provider:Bland, Cheyenne Gould Former Cardiology Providers: Cheyenne Gould, Cheyenne Gould Primary Cardiologist: Cheyenne Gould, [facility] (established care 07/23/2019)  Date: 07/23/19 Last Visit: April 16, 2019  Chief Complaint  Patient presents with  . Shortness of Breath  . Follow-up    3 month    HPI  Cheyenne Gould is a 74 y.o.  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  Patient was last seen in the office by Cheyenne Gould in February 2021.  She now presents to the office for follow-up and I am seeing her for the first time for the above-mentioned chief complaint.  Since last office visit patient states that she does have shortness of breath mostly with effort related activities but this is chronic and stable.  She states at times it has actually gotten better.  She continues to work with her pulmonologist given her underlying interstitial lung disease as per prior records.  She does use her inhalers on her regular basis and is compliant with her medical therapy.  She denies any chest pain at rest or with effort related activities.  No recent hospitalizations or urgent care visits for cardiovascular symptoms per patient.  During her prior visits she had mentioned effort related dyspnea and underwent workup with an echocardiogram and stress test.  Both of the results are noted below for further reference and overall favorable.  But due to continued effort related dyspnea she was recommended to undergo angiography but had deferred to medical therapy.  Patient states that she continues to feel  the same as her symptoms are stable and not worsening.  ALLERGIES: Allergies  Allergen Reactions  . Peanut-Containing Drug Products Anaphylaxis  . Shellfish Allergy Anaphylaxis  . Penicillins Other (See Comments)    Corners of mouth started splitting  . Sulfa Antibiotics Other (See Comments)    unknown  . Allegra [Fexofenadine] Cough  . Leflunomide Rash  . Lyrica [Pregabalin] Other (See Comments)    Causes rheumatoid flares     MEDICATION LIST PRIOR TO VISIT: Current Outpatient Medications on File Prior to Visit  Medication Sig Dispense Refill  . acetaminophen (TYLENOL) 500 MG tablet Take 500 mg by mouth every 6 (six) hours as needed for mild pain or headache.     . albuterol (PROAIR HFA) 108 (90 Base) MCG/ACT inhaler Inhale 2 puffs into the lungs every 6 (six) hours as needed for wheezing or shortness of breath. 1 Inhaler 3  . amLODipine (NORVASC) 5 MG tablet Take 5 mg by mouth daily.    Marland Kitchen aspirin EC 81 MG tablet Take 81 mg by mouth daily.    . betamethasone dipropionate 0.05 % cream Apply topically 2 (two) times daily.    . Cholecalciferol (VITAMIN D3) 2000 units TABS Take by mouth.    . EPIPEN 2-PAK 0.3 MG/0.3ML SOAJ injection Inject 0.3 mg into the muscle as needed (for allergic reactions).     . folic acid (FOLVITE) 1 MG tablet 2 TABLET BY MOUTH ONCE DAILY  3  . golimumab 2 mg/kg in sodium chloride 0.9 % Inject 2 mg/kg into the vein every 8 (eight) weeks.     Marland Kitchen ibuprofen (ADVIL) 600 MG tablet Take 1 tablet (  600 mg total) by mouth every 6 (six) hours as needed. 30 tablet 0  . lansoprazole (PREVACID) 15 MG capsule Take 15 mg by mouth daily at 12 noon.    Marland Kitchen levocetirizine (XYZAL) 5 MG tablet Take 5 mg by mouth at bedtime.     . methotrexate (RHEUMATREX) 2.5 MG tablet 7 tablets once a week  2  . metoprolol succinate (TOPROL-XL) 50 MG 24 hr tablet TAKE 1 TABLET (50 MG TOTAL) BY MOUTH DAILY. TAKE WITH OR IMMEDIATELY FOLLOWING A MEAL. 90 tablet 2  . simvastatin (ZOCOR) 10 MG tablet  TAKE 1 TABLET BY MOUTH EVERYDAY AT BEDTIME 90 tablet 1  . tazarotene (AVAGE) 0.1 % cream APPLY TO AFFECTED AREA EVERY DAY AT NIGHT    . valsartan-hydrochlorothiazide (DIOVAN-HCT) 160-12.5 MG tablet Take 1 tablet by mouth daily.     No current facility-administered medications on file prior to visit.    PAST MEDICAL HISTORY: Past Medical History:  Diagnosis Date  . Acid reflux   . AKI (acute kidney injury) (HCC) 06/07/2015  . Allergic sinusitis   . Anemia   . CAP (community acquired pneumonia) 06/07/2015  . Chest pain 01/01/2014  . Coronary artery calcification   . Hyperlipidemia   . Hypertension   . Hypoxia   . Rheumatoid arthritis (HCC)    on Leflunomide   . Rheumatoid arthritis(714.0)    on Leflunomide    PAST SURGICAL HISTORY: Past Surgical History:  Procedure Laterality Date  . ABDOMINAL HYSTERECTOMY  1985   Partial  . BUNIONECTOMY  2002,12/18/2006    FAMILY HISTORY: The patient's family history includes Cancer in her brother; Heart disease in her father; Hypertension in her sister; Lupus in her brother.   SOCIAL HISTORY:  The patient  reports that she quit smoking about 16 years ago. Her smoking use included cigarettes. She has a 40.00 pack-year smoking history. She has never used smokeless tobacco. She reports that she does not drink alcohol or use drugs.  Review of Systems  Constitution: Negative for decreased appetite, malaise/fatigue, weight gain and weight loss.  Eyes: Negative for visual disturbance.  Cardiovascular: Positive for dyspnea on exertion (improved and stable compared to prior visit). Negative for chest pain, claudication, leg swelling, orthopnea, palpitations (improved) and syncope.  Respiratory: Negative for hemoptysis and wheezing.   Endocrine: Negative for cold intolerance and heat intolerance.  Hematologic/Lymphatic: Does not bruise/bleed easily.  Skin: Negative for nail changes.  Musculoskeletal: Positive for back pain and joint pain. Negative  for muscle weakness and myalgias.  Gastrointestinal: Negative for abdominal pain, change in bowel habit, nausea and vomiting.  Neurological: Negative for difficulty with concentration, dizziness, focal weakness and headaches.  Psychiatric/Behavioral: Negative for altered mental status and suicidal ideas.  All other systems reviewed and are negative.   PHYSICAL EXAM: Vitals with BMI 07/23/2019 07/03/2019 04/16/2019  Height 5\' 6"  - 5\' 6"   Weight 198 lbs 6 oz - 200 lbs  BMI 32.04 - 32.3  Systolic 121 145  Diastolic 73 84 70  Pulse 70 74 77    CONSTITUTIONAL: Well-developed and well-nourished. No acute distress.  SKIN: Skin is warm and dry. No rash noted. No cyanosis. No pallor. No jaundice HEAD: Normocephalic and atraumatic.  EYES: No scleral icterus MOUTH/THROAT: Moist oral membranes.  NECK: No JVD present. No thyromegaly noted. No carotid bruits  LYMPHATIC: No visible cervical adenopathy.  CHEST Normal respiratory effort. No intercostal retractions  LUNGS: Decreased breath sounds at the bases.  No stridor. No wheezes. No rales.  CARDIOVASCULAR: Regular, positive Z3-G9, soft holosystolic murmur heard at the apex.  No gallops or rubs. ABDOMINAL: No apparent ascites.  EXTREMITIES: No peripheral edema  HEMATOLOGIC: No significant bruising NEUROLOGIC: Oriented to person, place, and time. Nonfocal. Normal muscle tone.  PSYCHIATRIC: Normal mood and affect. Normal behavior. Cooperative  CARDIAC DATABASE: CT of the chest  09/25/2017:  coronary atherosclerosis of LAD and left circumflex. Mild atherosclerotic [place] conditions of the aortic arch. Stable 6 mm nodule in right lower lobe likely benign.  EKG: 04/16/2019: Normal sinus rhythm at 73 bpm, normal axis, IRBBB. Poor quality EKG due to baseline artifact.   Echocardiogram: 05/04/2019: LVEF 92% grade 1 diastolic impairment, normal left atrial pressure, mild AR, mild MR, mild TR, RVSP 29 mmHg, no pulmonary hypertension per  echocardiogram.  Stress Testing:  Exercise myoview stress 04/26/2017: Exercised for 4 minutes and 50 seconds, achieved 4.6 METS, 97% of maximum predicted heart rate, low exercise capacity, stress ECG negative for ischemia. Stress and rest SPECT images demonstrate homogeneous tracer distribution throughout the myocardium. Gated SPECT imaging reveals normal myocardial thickening and wall motion.  LVEF 72%.  Low risk study.  48-hour Holter monitor 11/20/2018: Normal sinus rhythm.  Rare PAC.  No reported symptoms.  LABORATORY DATA: CBC Latest Ref Rng & Units 01/21/2019 06/14/2015 06/13/2015  WBC 4.0 - 10.5 K/uL 7.0 16.3(H) 14.8(H)  Hemoglobin 12.0 - 15.0 g/dL 11.2(L) 8.6(L) 7.7(L)  Hematocrit 36.0 - 46.0 % 33.8(L) 25.1(L) 22.2(L)  Platelets 150.0 - 400.0 K/uL 342.0 487(H) 433(H)    CMP Latest Ref Rng & Units 01/21/2019 06/14/2015 06/13/2015  Glucose 70 - 99 mg/dL 95 107(H) 97  BUN 6 - 23 mg/dL 15 23(H) 26(H)  Creatinine 0.40 - 1.20 mg/dL 1.13 1.10(H) 1.29(H)  Sodium 135 - 145 mEq/L 137 143 140  Potassium 3.5 - 5.1 mEq/L 3.3(L) 3.8 3.9  Chloride 96 - 112 mEq/L 103 111 109  CO2 19 - 32 mEq/L 25 24 22   Calcium 8.4 - 10.5 mg/dL 9.4 8.8(L) 8.5(L)  Total Protein 6.0 - 8.3 g/dL 6.9 - -  Total Bilirubin 0.2 - 1.2 mg/dL 0.4 - -  Alkaline Phos 39 - 117 U/L 111 - -  AST 0 - 37 U/L 16 - -  ALT 0 - 35 U/L 9 - -    Lipid Panel     Component Value Date/Time   CHOL 209 (H) 06/23/2019 1108   TRIG 56 06/23/2019 1108   HDL 79 06/23/2019 1108   CHOLHDL 2.6 06/23/2019 1108   CHOLHDL 2.7 01/02/2014 0858   VLDL 9 01/02/2014 0858   LDLCALC 120 (H) 06/23/2019 1108   LABVLDL 10 06/23/2019 1108    Lab Results  Component Value Date   HGBA1C 5.8 (H) 06/08/2015   No components found for: NTPROBNP No results found for: TSH  Cardiac Panel (last 3 results) No results for input(s): CKTOTAL, CKMB, TROPONINIHS, RELINDX in the last 72 hours.  IMPRESSION:    ICD-10-CM   1. Coronary artery calcification seen  on CT scan  I25.10 EKG 12-Lead  2. Dyspnea on exertion  R06.00   3. ILD (interstitial lung disease) ([place])  J84.9   4. Benign hypertension  I10   5. Mixed hyperlipidemia  E78.2 Bempedoic Acid-Ezetimibe (NEXLIZET) 180-10 MG TABS    Lipid Panel With LDL/HDL Ratio  6. Class 1 obesity due to excess calories with serious comorbidity and body mass index (BMI) of 32.0 to 32.9 in adult  E66.09    Z68.32   7. Encounter to discuss test results  Z71.2      RECOMMENDATIONS: Cheyenne Gould is a 74 y.o. 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  Coronary artery calcification:  Patient is noted to have coronary artery calcification on the most recent noncardiac CTA without contrast.  Continue aspirin.  Patient has been intolerant to multiple statin therapy.  Most recent lipid profile reviewed and her LDL currently not at goal.  Would recommend transitioning from Zetia to Nexlizet.   According to prior office notes patient has not tolerated Crestor.  She did not tolerate the increase in simvastatin.  Patient has undergone echocardiogram and nuclear stress test as noted above.  Patient continues to have effort related dyspnea but this is chronic and stable and mostly multifactorial she is currently being seen by pulmonary as well.  I did recommend either considering cardiac CTA versus invasive angiography to evaluate for obstructive coronary artery disease.  At this time patient does not want any additional cardiovascular testing at this time.  However she does understand that if she has symptoms that increase in intensity, frequency, and/or duration or has typical chest pain as discussed in the office she will seek medical attention at the closest ER via  EMS.  Dyspnea on exertion chronic and stable: See above  Mixed hyperlipidemia:   Most recent lipid profile reviewed.   Would recommend transitioning from Zetia to Nexlizet.   According to prior office notes patient has not tolerated Crestor.  She did not tolerate the increase in simvastatin.  Repeat lipid profile prior to next office visit.  Obesity, due to excess calories: . Body mass index is 32.02 kg/m. . I reviewed with the patient the importance of diet, regular physical activity/exercise, weight loss.   . Patient is educated on increasing physical activity gradually as tolerated.  With the goal of moderate intensity exercise for 30 minutes a day 5 days a week.  FINAL MEDICATION LIST END OF ENCOUNTER: Meds ordered this encounter  Medications  . Bempedoic Acid-Ezetimibe (NEXLIZET) 180-10 MG TABS    Sig: Take 1 tablet by mouth daily.    Dispense:  90 tablet    Refill:  1    Medications Discontinued During This Encounter  Medication Reason  . SYMBICORT 160-4.5 MCG/ACT inhaler Patient Preference  . valACYclovir (VALTREX) 500 MG tablet Completed Course  . ezetimibe (ZETIA) 10 MG tablet Change in therapy     Current Outpatient Medications:  .  acetaminophen (TYLENOL) 500 MG tablet, Take 500 mg by mouth every 6 (six) hours as needed for mild pain or headache. , Disp: , Rfl:  .  albuterol (PROAIR HFA) 108 (90 Base) MCG/ACT inhaler, Inhale 2 puffs into the lungs every 6 (six) hours as needed for wheezing or shortness of breath., Disp: 1 Inhaler, Rfl: 3 .  amLODipine (NORVASC) 5 MG tablet, Take 5 mg by mouth daily., Disp: , Rfl:  .  aspirin EC 81 MG tablet, Take 81 mg by mouth daily., Disp: , Rfl:  .  betamethasone dipropionate 0.05 % cream, Apply topically 2 (two) times daily., Disp: , Rfl:  .  Cholecalciferol (VITAMIN D3) 2000 units TABS, Take by mouth., Disp: , Rfl:  .  EPIPEN 2-PAK 0.3 MG/0.3ML SOAJ injection, Inject 0.3 mg into the muscle as needed (for allergic reactions).  , Disp: , Rfl:  .  folic acid (FOLVITE) 1 MG tablet, 2  TABLET BY MOUTH ONCE DAILY, Disp: , Rfl: 3 .  golimumab 2 mg/kg in sodium chloride 0.9 %, Inject 2 mg/kg into the vein every 8 (eight) weeks. , Disp: , Rfl:  .  ibuprofen (ADVIL) 600 MG tablet, Take 1 tablet (600 mg total) by mouth every 6 (six) hours as needed., Disp: 30 tablet, Rfl: 0 .  lansoprazole (PREVACID) 15 MG capsule, Take 15 mg by mouth daily at 12 noon., Disp: , Rfl:  .  levocetirizine (XYZAL) 5 MG tablet, Take 5 mg by mouth at bedtime. , Disp: , Rfl:  .  methotrexate (RHEUMATREX) 2.5 MG tablet, 7 tablets once a week, Disp: , Rfl: 2 .  metoprolol succinate (TOPROL-XL) 50 MG 24 hr tablet, TAKE 1 TABLET (50 MG TOTAL) BY MOUTH DAILY. TAKE WITH OR IMMEDIATELY FOLLOWING A MEAL., Disp: 90 tablet, Rfl: 2 .  simvastatin (ZOCOR) 10 MG tablet, TAKE 1 TABLET BY MOUTH EVERYDAY AT BEDTIME, Disp: 90 tablet, Rfl: 1 .  tazarotene (AVAGE) 0.1 % cream, APPLY TO AFFECTED AREA EVERY DAY AT NIGHT, Disp: , Rfl:  .  valsartan-hydrochlorothiazide (DIOVAN-HCT) 160-12.5 MG tablet, Take 1 tablet by mouth daily., Disp: , Rfl:  .  Bempedoic Acid-Ezetimibe (NEXLIZET) 180-10 MG TABS, Take 1 tablet by mouth daily., Disp: 90 tablet, Rfl: 1  Orders Placed This Encounter  Procedures  . Lipid Panel With LDL/HDL Ratio  . EKG 12-Lead   --Continue cardiac medications as reconciled in final medication list. --Return in about 4 months (around 11/20/2019) for re-evaluation of symptoms., Lipid follow-up, check if labs are done prior to appt. . Or sooner if needed. --Continue follow-up with your primary care physician regarding the management of your other chronic comorbid conditions.  Patient's questions and concerns were addressed to her satisfaction. She voices understanding of the instructions provided during this encounter.   This note was created using a voice recognition software as a result there may be grammatical errors inadvertently enclosed that Gould not  reflect the nature of this encounter. Every attempt is made to correct such errors.  Cheyenne Gould, [place], [facility]  Pager: [phone] Office: [phone]

## 2019-07-24 DIAGNOSIS — Z79899 Other long term (current) drug therapy: Secondary | ICD-10-CM | POA: Diagnosis not present

## 2019-07-24 DIAGNOSIS — M0589 Other rheumatoid arthritis with rheumatoid factor of multiple sites: Secondary | ICD-10-CM | POA: Diagnosis not present

## 2019-07-27 DIAGNOSIS — R1013 Epigastric pain: Secondary | ICD-10-CM | POA: Diagnosis not present

## 2019-07-27 DIAGNOSIS — K259 Gastric ulcer, unspecified as acute or chronic, without hemorrhage or perforation: Secondary | ICD-10-CM | POA: Diagnosis not present

## 2019-07-27 DIAGNOSIS — M0570 Rheumatoid arthritis with rheumatoid factor of unspecified site without organ or systems involvement: Secondary | ICD-10-CM | POA: Diagnosis not present

## 2019-07-30 ENCOUNTER — Other Ambulatory Visit: Payer: Self-pay

## 2019-07-30 DIAGNOSIS — E782 Mixed hyperlipidemia: Secondary | ICD-10-CM

## 2019-07-31 DIAGNOSIS — J441 Chronic obstructive pulmonary disease with (acute) exacerbation: Secondary | ICD-10-CM | POA: Diagnosis not present

## 2019-07-31 DIAGNOSIS — R064 Hyperventilation: Secondary | ICD-10-CM | POA: Diagnosis not present

## 2019-07-31 DIAGNOSIS — I499 Cardiac arrhythmia, unspecified: Secondary | ICD-10-CM | POA: Diagnosis not present

## 2019-07-31 DIAGNOSIS — I10 Essential (primary) hypertension: Secondary | ICD-10-CM | POA: Diagnosis not present

## 2019-08-31 DIAGNOSIS — K5900 Constipation, unspecified: Secondary | ICD-10-CM | POA: Diagnosis not present

## 2019-08-31 DIAGNOSIS — R1013 Epigastric pain: Secondary | ICD-10-CM | POA: Diagnosis not present

## 2019-08-31 DIAGNOSIS — K259 Gastric ulcer, unspecified as acute or chronic, without hemorrhage or perforation: Secondary | ICD-10-CM | POA: Diagnosis not present

## 2019-09-18 DIAGNOSIS — M0589 Other rheumatoid arthritis with rheumatoid factor of multiple sites: Secondary | ICD-10-CM | POA: Diagnosis not present

## 2019-09-18 DIAGNOSIS — Z79899 Other long term (current) drug therapy: Secondary | ICD-10-CM | POA: Diagnosis not present

## 2019-09-29 DIAGNOSIS — K219 Gastro-esophageal reflux disease without esophagitis: Secondary | ICD-10-CM | POA: Diagnosis not present

## 2019-09-29 DIAGNOSIS — R1013 Epigastric pain: Secondary | ICD-10-CM | POA: Diagnosis not present

## 2019-09-29 DIAGNOSIS — K316 Fistula of stomach and duodenum: Secondary | ICD-10-CM | POA: Diagnosis not present

## 2019-10-15 DIAGNOSIS — L4059 Other psoriatic arthropathy: Secondary | ICD-10-CM | POA: Diagnosis not present

## 2019-10-15 DIAGNOSIS — R0602 Shortness of breath: Secondary | ICD-10-CM | POA: Diagnosis not present

## 2019-10-15 DIAGNOSIS — Z6831 Body mass index (BMI) 31.0-31.9, adult: Secondary | ICD-10-CM | POA: Diagnosis not present

## 2019-10-15 DIAGNOSIS — M0589 Other rheumatoid arthritis with rheumatoid factor of multiple sites: Secondary | ICD-10-CM | POA: Diagnosis not present

## 2019-10-15 DIAGNOSIS — L401 Generalized pustular psoriasis: Secondary | ICD-10-CM | POA: Diagnosis not present

## 2019-10-15 DIAGNOSIS — M25512 Pain in left shoulder: Secondary | ICD-10-CM | POA: Diagnosis not present

## 2019-10-15 DIAGNOSIS — M5136 Other intervertebral disc degeneration, lumbar region: Secondary | ICD-10-CM | POA: Diagnosis not present

## 2019-10-15 DIAGNOSIS — M150 Primary generalized (osteo)arthritis: Secondary | ICD-10-CM | POA: Diagnosis not present

## 2019-10-15 DIAGNOSIS — E669 Obesity, unspecified: Secondary | ICD-10-CM | POA: Diagnosis not present

## 2019-10-20 ENCOUNTER — Ambulatory Visit: Payer: Medicare Other | Attending: Critical Care Medicine

## 2019-10-20 DIAGNOSIS — E785 Hyperlipidemia, unspecified: Secondary | ICD-10-CM | POA: Diagnosis not present

## 2019-10-20 DIAGNOSIS — I10 Essential (primary) hypertension: Secondary | ICD-10-CM | POA: Diagnosis not present

## 2019-10-20 DIAGNOSIS — Z23 Encounter for immunization: Secondary | ICD-10-CM

## 2019-10-20 DIAGNOSIS — M0589 Other rheumatoid arthritis with rheumatoid factor of multiple sites: Secondary | ICD-10-CM | POA: Diagnosis not present

## 2019-10-20 NOTE — Progress Notes (Addendum)
   Covid-19 Vaccination Clinic  Name:  Cheyenne Gould    MRN: [id number] DOB: October 01, 1945  10/20/2019  [name] was observed post Covid-19 immunization for 30 minutes based on pre-vaccination screening without incident. She was provided with Vaccine Information Sheet and instruction to access the V-Safe system.   [name] was instructed to call [phone] with any severe reactions post vaccine: Marland Kitchen Difficulty breathing  . Swelling of face and throat  . A fast heartbeat  . A bad rash all over body  . Dizziness and weakness

## 2019-11-10 DIAGNOSIS — E782 Mixed hyperlipidemia: Secondary | ICD-10-CM | POA: Diagnosis not present

## 2019-11-11 LAB — LIPID PANEL WITH LDL/HDL RATIO
Cholesterol, Total: 197 mg/dL (ref 100–199)
HDL: 74 mg/dL (ref >39)
LDL Chol Calc (NIH): 108 mg/dL — ABNORMAL HIGH (ref 0–99)
LDL/HDL Ratio: 1.5 ratio (ref 0.0–3.2)
Triglycerides: 81 mg/dL (ref 0–149)
VLDL Cholesterol Cal: 15 mg/dL (ref 5–40)

## 2019-11-23 ENCOUNTER — Ambulatory Visit: Payer: Federal, State, Local not specified - PPO | Admitting: Cardiology

## 2019-11-23 ENCOUNTER — Encounter: Payer: Self-pay | Admitting: Cardiology

## 2019-11-23 ENCOUNTER — Other Ambulatory Visit: Payer: Self-pay

## 2019-11-23 VITALS — BP 122/68 | HR 71 | Resp 15 | Ht 66.0 in | Wt 198.0 lb

## 2019-11-23 DIAGNOSIS — R0609 Other forms of dyspnea: Secondary | ICD-10-CM | POA: Diagnosis not present

## 2019-11-23 DIAGNOSIS — J849 Interstitial pulmonary disease, unspecified: Secondary | ICD-10-CM | POA: Diagnosis not present

## 2019-11-23 DIAGNOSIS — Z6832 Body mass index (BMI) 32.0-32.9, adult: Secondary | ICD-10-CM | POA: Diagnosis not present

## 2019-11-23 DIAGNOSIS — E6609 Other obesity due to excess calories: Secondary | ICD-10-CM | POA: Diagnosis not present

## 2019-11-23 DIAGNOSIS — E782 Mixed hyperlipidemia: Secondary | ICD-10-CM

## 2019-11-23 DIAGNOSIS — R0600 Dyspnea, unspecified: Secondary | ICD-10-CM

## 2019-11-23 DIAGNOSIS — I10 Essential (primary) hypertension: Secondary | ICD-10-CM | POA: Diagnosis not present

## 2019-11-23 DIAGNOSIS — I2510 Atherosclerotic heart disease of native coronary artery without angina pectoris: Secondary | ICD-10-CM

## 2019-11-23 MED ORDER — NEXLIZET 180-10 MG PO TABS: 1.0000 | ORAL_TABLET | Freq: Every day | ORAL | 1 refills | Status: DC

## 2019-11-23 MED ORDER — ASPIRIN EC 81 MG PO TBEC: 81.0000 mg | DELAYED_RELEASE_TABLET | Freq: Every day | ORAL | 0 refills | Status: DC

## 2019-11-23 NOTE — Progress Notes (Signed)
Cheyenne Gould Date of Birth: 21-Jan-1946 MRN: [id number] Primary Care Provider:Bland, Adrian Saran, MD Former Cardiology Providers: Altamese Geneva, APRN, FNP-C, Dr. Yates Decamp Primary Cardiologist: Tessa Lerner, DO, [facility] (established care 07/23/2019)  Date: 11/23/19 Last Office Visit: 07/23/2019  Chief Complaint  Patient presents with  . Follow-up    4 month  . Results    HPI  Cheyenne Gould is a 74 y.o.  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  In the past being followed by Altamese Mulberry and reestablish care with myself back in June 2021.  She is here for 23-month follow-up for management of dyspnea and coronary artery calcification.  Patient was noted to have coronary artery calcification on a nongated CT scan.  She has undergone an echocardiogram and stress test as noted below.  Her shortness of breath with effort related activity remains chronic and stable.  A component of her effort related dyspnea may also be due to interstitial lung disease.  Given her effort related dyspnea at the last office visit we discussed possibly undergoing coronary CTA or angiography to rule out obstructive CAD.  However patient wanted to treat her symptoms medically and is here for follow-up as noted above.  No hospitalizations or urgent care visits for cardiovascular symptoms.  Due to coronary artery calcification patient was recommended to be on aspirin and statin therapy.  Patient forgot to initiate aspirin therapy since last visit.  In regards to statin therapy patient states that she is on simvastatin 10 mg p.o. nightly.  If she tries to increase the statin dose she gets myalgias  and is unable to tolerate them.  She has been intolerant to Crestor in the past.  At the last office visit the goal is to transition her to Nexlizet.  Patient states this medication was too expensive and therefore continue to take Zetia.  Most recent lipid profile reviewed with the patient at today's visit.   ALLERGIES: Allergies  Allergen Reactions  . Peanut-Containing Drug Products Anaphylaxis  . Shellfish Allergy Anaphylaxis  . Penicillins Other (See Comments)    Corners of mouth started splitting  . Sulfa Antibiotics Other (See Comments)    unknown  . Allegra [Fexofenadine] Cough  . Leflunomide Rash  . Lyrica [Pregabalin] Other (See Comments)    Causes rheumatoid flares     MEDICATION LIST PRIOR TO VISIT: Current Outpatient Medications on File Prior to Visit  Medication Sig Dispense Refill  . acetaminophen (TYLENOL) 500 MG tablet Take 500 mg by mouth every 6 (six) hours as needed for mild pain or headache.     . albuterol (PROAIR HFA) 108 (90 Base) MCG/ACT inhaler Inhale 2 puffs into the lungs every 6 (six) hours as needed for wheezing or shortness of breath. 1 Inhaler 3  . amLODipine (NORVASC) 5 MG tablet Take 5 mg by mouth daily.    . betamethasone dipropionate 0.05 % cream Apply topically 2 (two) times daily.    . Cholecalciferol (VITAMIN D3) 2000 units TABS Take by mouth.    . clindamycin (CLEOCIN) 300 MG capsule Take 300 mg by mouth as needed. Pre dental procedures    . EPIPEN 2-PAK 0.3 MG/0.3ML SOAJ injection Inject 0.3 mg into the muscle as needed (for allergic reactions).     Marland Kitchen  ezetimibe (ZETIA) 10 MG tablet Take 10 mg by mouth daily.    . folic acid (FOLVITE) 1 MG tablet 2 TABLET BY MOUTH ONCE DAILY  3  . ibuprofen (ADVIL) 600 MG tablet Take 1 tablet (600 mg total) by mouth every 6 (six) hours as needed. 30 tablet 0  . lansoprazole (PREVACID) 15 MG capsule Take 15 mg by mouth daily at 12 noon.    Marland Kitchen levocetirizine (XYZAL) 5 MG tablet Take 5 mg by mouth at bedtime.     .  methotrexate (RHEUMATREX) 2.5 MG tablet 7 tablets once a week  2  . metoprolol succinate (TOPROL-XL) 50 MG 24 hr tablet TAKE 1 TABLET (50 MG TOTAL) BY MOUTH DAILY. TAKE WITH OR IMMEDIATELY FOLLOWING A MEAL. 90 tablet 2  . simvastatin (ZOCOR) 10 MG tablet TAKE 1 TABLET BY MOUTH EVERYDAY AT BEDTIME 90 tablet 1  . tazarotene (AVAGE) 0.1 % cream APPLY TO AFFECTED AREA EVERY DAY AT NIGHT    . valsartan-hydrochlorothiazide (DIOVAN-HCT) 160-12.5 MG tablet Take 1 tablet by mouth daily.    Marland Kitchen golimumab 2 mg/kg in sodium chloride 0.9 % Inject 2 mg/kg into the vein every 8 (eight) weeks.  (Patient not taking: Reported on 11/23/2019)     No current facility-administered medications on file prior to visit.    PAST MEDICAL HISTORY: Past Medical History:  Diagnosis Date  . Acid reflux   . AKI (acute kidney injury) (HCC) 06/07/2015  . Allergic sinusitis   . Anemia   . CAP (community acquired pneumonia) 06/07/2015  . Chest pain 01/01/2014  . Coronary artery calcification   . Hyperlipidemia   . Hypertension   . Hypoxia   . Rheumatoid arthritis (HCC)    on Leflunomide   . Rheumatoid arthritis(714.0)    on Leflunomide    PAST SURGICAL HISTORY: Past Surgical History:  Procedure Laterality Date  . ABDOMINAL HYSTERECTOMY  1985   Partial  . BUNIONECTOMY  2002,12/18/2006    FAMILY HISTORY: The patient's family history includes Cancer in her brother; Heart disease in her father; Hypertension in her sister; Lupus in her brother.   SOCIAL HISTORY:  The patient  reports that she quit smoking about 16 years ago. Her smoking use included cigarettes. She has a 40.00 pack-year smoking history. She has never used smokeless tobacco. She reports that she does not drink alcohol and does not use drugs.  Review of Systems  Constitutional: Negative for decreased appetite, malaise/fatigue, weight gain and weight loss.  Eyes: Negative for visual disturbance.  Cardiovascular: Positive for dyspnea on exertion  (improved and stable compared to prior visit). Negative for chest pain, claudication, leg swelling, orthopnea, palpitations (improved) and syncope.  Respiratory: Negative for hemoptysis and wheezing.   Endocrine: Negative for cold intolerance and heat intolerance.  Hematologic/Lymphatic: Does not bruise/bleed easily.  Skin: Negative for nail changes.  Musculoskeletal: Positive for back pain and joint pain. Negative for muscle weakness and myalgias.  Gastrointestinal: Negative for abdominal pain, change in bowel habit, nausea and vomiting.  Neurological: Negative for difficulty with concentration, dizziness, focal weakness and headaches.  Psychiatric/Behavioral: Negative for altered mental status and suicidal ideas.  All other systems reviewed and are negative.   PHYSICAL EXAM: Vitals with BMI 11/23/2019 07/23/2019 07/03/2019  Height 5\' 6"  5\' 6"  -  Weight 198 lbs 198 lbs 6 oz -  BMI 31.97 32.04 -  Systolic 122 121 263  Diastolic 68 73 84  Pulse 71 70 74    CONSTITUTIONAL: Well-developed and well-nourished. No acute distress.  SKIN: Skin is warm and dry. No rash noted. No cyanosis. No pallor. No jaundice HEAD: Normocephalic and atraumatic.  EYES: No scleral icterus MOUTH/THROAT: Moist oral membranes.  NECK: No JVD present. No thyromegaly noted. No carotid bruits  LYMPHATIC: No visible cervical adenopathy.  CHEST Normal respiratory effort. No intercostal retractions  LUNGS: Decreased breath sounds at the bases.  No stridor. No wheezes. No rales.  CARDIOVASCULAR: Regular, positive S1-S2, soft holosystolic murmur heard at the apex.  No gallops or rubs. ABDOMINAL: No apparent ascites.  EXTREMITIES: No peripheral edema.  psoriasis on bilateral palms. HEMATOLOGIC: No significant bruising NEUROLOGIC: Oriented to person, place, and time. Nonfocal. Normal muscle tone.  PSYCHIATRIC: Normal mood and affect. Normal behavior. Cooperative  CARDIAC DATABASE: CT of the chest  09/25/2017:  coronary  atherosclerosis of LAD and left circumflex. Mild atherosclerotic [place] conditions of the aortic arch. Stable 6 mm nodule in right lower lobe likely benign.  EKG: 04/16/2019: Normal sinus rhythm at 73 bpm, normal axis, IRBBB. Poor quality EKG due to baseline artifact.   Echocardiogram: 05/04/2019: LVEF 66% grade 1 diastolic impairment, normal left atrial pressure, mild AR, mild MR, mild TR, RVSP 29 mmHg, no pulmonary hypertension per echocardiogram.  Stress Testing:  Exercise myoview stress 04/26/2017: Exercised for 4 minutes and 50 seconds, achieved 4.6 METS, 97% of maximum predicted heart rate, low exercise capacity, stress ECG negative for ischemia. Stress and rest SPECT images demonstrate homogeneous tracer distribution throughout the myocardium. Gated SPECT imaging reveals normal myocardial thickening and wall motion.  LVEF 72%.  Low risk study.  48-hour Holter monitor 11/20/2018: Normal sinus rhythm.  Rare PAC.  No reported symptoms.  LABORATORY DATA: CBC Latest Ref Rng & Units 01/21/2019 06/14/2015 06/13/2015  WBC 4.0 - 10.5 K/uL 7.0 16.3(H) 14.8(H)  Hemoglobin 12.0 - 15.0 g/dL 11.2(L) 8.6(L) 7.7(L)  Hematocrit 36 - 46 % 33.8(L) 25.1(L) 22.2(L)  Platelets 150 - 400 K/uL 342.0 487(H) 433(H)    CMP Latest Ref Rng & Units 01/21/2019 06/14/2015 06/13/2015  Glucose 70 - 99 mg/dL 95 409(W) 97  BUN 6 - 23 mg/dL 15 11(B) 14(N)  Creatinine 0.40 - 1.20 mg/dL 8.29 5.62(Z) 3.08(M)  Sodium 135 - 145 mEq/L 137 143 140  Potassium 3.5 - 5.1 mEq/L 3.3(L) 3.8 3.9  Chloride 96 - 112 mEq/L 103 111 109  CO2 19 - 32 mEq/L Calcium 8.4 - 10.5 mg/dL 9.4 5.7(Q) 4.6(N)  Total Protein 6.0 - 8.3 g/dL 6.9 - -  Total Bilirubin 0.2 - 1.2 mg/dL 0.4 - -  Alkaline Phos 39 - 117 U/L 111 - -  AST 0 - 37 U/L 16 - -  ALT 0 - 35 U/L 9 - -    Lipid Panel     Component Value Date/Time   CHOL 197 11/10/2019 1308   TRIG 81 11/10/2019 1308   HDL 74 11/10/2019 1308   CHOLHDL 2.6 06/23/2019 1108   CHOLHDL 2.7  01/02/2014 0858   VLDL 9 01/02/2014 0858   LDLCALC 108 (H) 11/10/2019 1308   LABVLDL 15 11/10/2019 1308    Lab Results  Component Value Date   HGBA1C 5.8 (H) 06/08/2015   No components found for: NTPROBNP No results found for: TSH  Cardiac Panel (last 3 results) No results for input(s): CKTOTAL, CKMB, TROPONINIHS, RELINDX in the last 72 hours.  IMPRESSION:    ICD-10-CM   1. Coronary artery calcification seen on CT scan  I25.10 aspirin EC 81 MG tablet    Bempedoic Acid-Ezetimibe (NEXLIZET) 180-10  MG TABS    LDL cholesterol, direct    Lipid Panel With LDL/HDL Ratio    Lipid Panel With LDL/HDL Ratio    LDL cholesterol, direct  2. Mixed hyperlipidemia  E78.2 Bempedoic Acid-Ezetimibe (NEXLIZET) 180-10 MG TABS    LDL cholesterol, direct    Lipid Panel With LDL/HDL Ratio    Lipid Panel With LDL/HDL Ratio    LDL cholesterol, direct  3. Dyspnea on exertion  R06.00   4. ILD (interstitial lung disease) (HCC)  J84.9   5. Benign hypertension  I10   6. Class 1 obesity due to excess calories with serious comorbidity and body mass index (BMI) of 32.0 to 32.9 in adult  E66.09    Z68.32      RECOMMENDATIONS: Cheyenne Gould is a 74 y.o. 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  Coronary artery calcification:  Patient is noted to have coronary artery calcification on the most recent noncardiac CTA without contrast.    At the last office visit patient was instructed to be on aspirin 81 mg p.o. daily.  However, patient states that she forgot and will start it after today's office visit.    Tolerates simvastatin well; however, if she increases the does to 20mg  po qhs she gets myalgias.   Nexlizet was cost prohibitive.  And therefore she is currently on  Zetia.  Lipid profile from September 2021 reviewed with the patient.  Minimal improvement in LDL.  Continue Zetia for now.  We will send a new prescription for Nexlizet and work on prior authorization for patient assistance.  Once started on Nexlizet patient is instructed to stop Zetia.  Repeat lipid profile prior to the next 73-month appointment.  Patient has undergone echocardiogram and nuclear stress test as noted above.  Patient continues to have effort related dyspnea but this is chronic and stable and mostly multifactorial she is currently being seen by pulmonary as well.  Patient chooses to treat her symptoms medically and not undergo any additional testing such as coronary CTA versus invasive angiography.  Continue to monitor. However she does understand that if she has symptoms that increase in intensity, frequency, and/or duration or has typical chest pain as discussed in the office she will seek medical attention at the closest ER via EMS.  Dyspnea on exertion chronic and stable: See above  Mixed hyperlipidemia: Continue simvastatin and Zetia.  Once Nexlizet is initiated patient will stop Zetia.  Lipid profile prior to next visit, ordered and released.  Obesity, due to excess calories: Body mass index is 31.96 kg/m. . I reviewed with the patient the importance of diet, regular physical activity/exercise, weight loss.   . Patient is educated on increasing physical activity gradually as tolerated.  With the goal of moderate intensity exercise for 30 minutes a day 5 days a week.  FINAL MEDICATION LIST END OF ENCOUNTER: Meds ordered this encounter  Medications  . aspirin EC 81 MG tablet    Sig: Take 1 tablet (81 mg total) by mouth daily.    Dispense:  90 tablet    Refill:  0  . Bempedoic Acid-Ezetimibe (NEXLIZET) 180-10 MG TABS    Sig: Take 1 tablet by mouth daily.    Dispense:  90 tablet    Refill:  1    Medications Discontinued During This Encounter  Medication Reason  .  Bempedoic Acid-Ezetimibe (NEXLIZET) 180-10 MG TABS Reorder  . aspirin EC 81 MG tablet Reorder     Current Outpatient Medications:  .  acetaminophen (TYLENOL) 500 MG tablet, Take 500 mg by mouth every 6 (six) hours as needed for mild pain or headache. , Disp: , Rfl:  .  albuterol (PROAIR HFA) 108 (90 Base) MCG/ACT inhaler, Inhale 2 puffs into the lungs every 6 (six) hours as needed for wheezing or shortness of breath., Disp: 1 Inhaler, Rfl: 3 .  amLODipine (NORVASC) 5 MG tablet, Take 5 mg by mouth daily., Disp: , Rfl:  .  betamethasone dipropionate 0.05 % cream, Apply topically 2 (two) times daily., Disp: , Rfl:  .  Cholecalciferol (VITAMIN D3) 2000 units TABS, Take by mouth., Disp: , Rfl:  .  clindamycin (CLEOCIN) 300 MG capsule, Take 300 mg by mouth as needed. Pre dental procedures, Disp: , Rfl:  .  EPIPEN 2-PAK 0.3 MG/0.3ML SOAJ injection, Inject 0.3 mg into the muscle as needed (for allergic reactions). , Disp: , Rfl:  .  ezetimibe (ZETIA) 10 MG tablet, Take 10 mg by mouth daily., Disp: , Rfl:  .  folic acid (FOLVITE) 1 MG tablet, 2 TABLET BY MOUTH ONCE DAILY, Disp: , Rfl: 3 .  ibuprofen (ADVIL) 600 MG tablet, Take 1 tablet (600 mg total) by mouth every 6 (six) hours as needed., Disp: 30 tablet, Rfl: 0 .  lansoprazole (PREVACID) 15 MG capsule, Take 15 mg by mouth daily at 12 noon., Disp: , Rfl:  .  levocetirizine (XYZAL) 5 MG tablet, Take 5 mg by mouth at bedtime. , Disp: , Rfl:  .  methotrexate (RHEUMATREX) 2.5 MG tablet, 7 tablets once a week, Disp: , Rfl: 2 .  metoprolol succinate (TOPROL-XL) 50 MG 24 hr tablet, TAKE 1 TABLET (50 MG TOTAL) BY MOUTH DAILY. TAKE WITH OR IMMEDIATELY FOLLOWING A MEAL., Disp: 90 tablet, Rfl: 2 .  simvastatin (ZOCOR) 10 MG tablet, TAKE 1 TABLET BY MOUTH EVERYDAY AT BEDTIME, Disp: 90 tablet, Rfl: 1 .  tazarotene (AVAGE) 0.1 % cream, APPLY TO AFFECTED AREA EVERY DAY AT NIGHT, Disp: , Rfl:  .  valsartan-hydrochlorothiazide (DIOVAN-HCT) 160-12.5 MG tablet, Take 1  tablet by mouth daily., Disp: , Rfl:  .  aspirin EC 81 MG tablet, Take 1 tablet (81 mg total) by mouth daily., Disp: 90 tablet, Rfl: 0 .  Bempedoic Acid-Ezetimibe (NEXLIZET) 180-10 MG TABS, Take 1 tablet by mouth daily., Disp: 90 tablet, Rfl: 1 .  golimumab 2 mg/kg in sodium chloride 0.9 %, Inject 2 mg/kg into the vein every 8 (eight) weeks.  (Patient not taking: Reported on 11/23/2019), Disp: , Rfl:   Orders Placed This Encounter  Procedures  . LDL cholesterol, direct  . Lipid Panel With LDL/HDL Ratio   --Continue cardiac medications as reconciled in final medication list. --Return in about 6 months (around 06/03/2020) for Followup, Dyspnea, Lipid. Or sooner if needed. --Continue follow-up with your primary care physician regarding the management of your other chronic comorbid conditions.  Patient's questions and concerns were addressed to her satisfaction. She voices understanding of the instructions provided during this encounter.   This note was created using a voice recognition software as a result there may be grammatical errors inadvertently enclosed that do not reflect the nature of this encounter. Every attempt is made to correct such errors.  Total time spent: 33 minutes  Delilah Shan [facility]  Pager: [phone] Office: [phone]

## 2019-11-30 DIAGNOSIS — E785 Hyperlipidemia, unspecified: Secondary | ICD-10-CM | POA: Diagnosis not present

## 2019-11-30 DIAGNOSIS — M130 Polyarthritis, unspecified: Secondary | ICD-10-CM | POA: Diagnosis not present

## 2019-11-30 DIAGNOSIS — Z23 Encounter for immunization: Secondary | ICD-10-CM | POA: Diagnosis not present

## 2019-11-30 DIAGNOSIS — F064 Anxiety disorder due to known physiological condition: Secondary | ICD-10-CM | POA: Diagnosis not present

## 2019-11-30 DIAGNOSIS — I499 Cardiac arrhythmia, unspecified: Secondary | ICD-10-CM | POA: Diagnosis not present

## 2019-12-01 ENCOUNTER — Other Ambulatory Visit: Payer: Self-pay | Admitting: Cardiology

## 2019-12-17 DIAGNOSIS — Z1231 Encounter for screening mammogram for malignant neoplasm of breast: Secondary | ICD-10-CM | POA: Diagnosis not present

## 2019-12-19 DIAGNOSIS — M0589 Other rheumatoid arthritis with rheumatoid factor of multiple sites: Secondary | ICD-10-CM | POA: Diagnosis not present

## 2019-12-19 DIAGNOSIS — I10 Essential (primary) hypertension: Secondary | ICD-10-CM | POA: Diagnosis not present

## 2019-12-19 DIAGNOSIS — E785 Hyperlipidemia, unspecified: Secondary | ICD-10-CM | POA: Diagnosis not present

## 2019-12-27 ENCOUNTER — Other Ambulatory Visit: Payer: Self-pay | Admitting: Cardiology

## 2020-01-07 DIAGNOSIS — L403 Pustulosis palmaris et plantaris: Secondary | ICD-10-CM | POA: Diagnosis not present

## 2020-01-10 ENCOUNTER — Encounter (HOSPITAL_COMMUNITY): Payer: Self-pay

## 2020-01-10 ENCOUNTER — Other Ambulatory Visit: Payer: Self-pay

## 2020-01-10 ENCOUNTER — Ambulatory Visit (HOSPITAL_COMMUNITY)
Admission: EM | Admit: 2020-01-10 | Discharge: 2020-01-10 | Disposition: A | Discharge status: Home / Self Care | Payer: Medicare Other | Attending: Family Medicine | Admitting: Family Medicine

## 2020-01-10 DIAGNOSIS — T50905A Adverse effect of unspecified drugs, medicaments and biological substances, initial encounter: Secondary | ICD-10-CM

## 2020-01-10 MED ORDER — TRIAMCINOLONE ACETONIDE 0.1 % EX OINT: 1.0000 "application " | TOPICAL_OINTMENT | Freq: Two times a day (BID) | CUTANEOUS | 1 refills | Status: DC

## 2020-01-10 NOTE — ED Notes (Addendum)
Pt denies any difficulty breathing, chest tightness, throat tightness, wheezing.

## 2020-01-10 NOTE — ED Triage Notes (Signed)
Pt in with c/o allergic reaction to new medication that she noticed Friday. States she gave herself the medication Taltz injection and later she noticed a red welt about the size of a quarter on her thigh.  States that she took benadryl with some relief  Redness noted to left  Upper thigh and is itching  Denies any respiratory distress

## 2020-01-10 NOTE — ED Provider Notes (Signed)
[facility]    CSN: [id number] Arrival date & time: 01/10/20  1006      History   Chief Complaint Chief Complaint  Patient presents with  . allergic reaction    HPI Cheyenne Gould is a 74 y.o. female.   HPI  Patient has pustular psoriatic arthritis.  For this reason she is receiving Toltz injections.  She has done this monthly for greater than 8 months.  She is having increasing reactions with every shots.  She is here today because the shot she did yesterday caused a very large red area that was itching.  She took Benadryl.  Slightly improved today.  She would like to have this seen.  She wonders what else to do.  Past Medical History:  Diagnosis Date  . Acid reflux   . AKI (acute kidney injury) (HCC) 06/07/2015  . Allergic sinusitis   . Anemia   . CAP (community acquired pneumonia) 06/07/2015  . Chest pain 01/01/2014  . Coronary artery calcification   . Hyperlipidemia   . Hypertension   . Hypoxia   . Rheumatoid arthritis (HCC)    on Leflunomide   . Rheumatoid arthritis(714.0)    on Leflunomide    Patient Active Problem List   Diagnosis Date Noted  . ILD (interstitial lung disease) (HCC) 03/31/2019  . Bronchitis 03/31/2019    Class: Acute  . Palpitations 08/16/2016  . Hypoxia   . Sepsis due to pneumonia (HCC) 06/08/2015  . Hypotension 06/08/2015  . CAP (community acquired pneumonia) 06/07/2015  . AKI (acute kidney injury) (HCC) 06/07/2015  . Chest pain 01/01/2014  . Hypertension   . Rheumatoid arthritis (HCC)   . Hyperlipidemia   . Anemia     Past Surgical History:  Procedure Laterality Date  . ABDOMINAL HYSTERECTOMY  1985   Partial  . BUNIONECTOMY  2002,12/18/2006    OB History   No obstetric history on file.      Home Medications    Prior to Admission medications   Medication Sig Start Date End Date Taking? Authorizing Provider  acetaminophen (TYLENOL) 500 MG tablet Take 500 mg by mouth every 6 (six) hours as needed for mild pain  or headache.     [provider]  albuterol (PROAIR HFA) 108 (90 Base) MCG/ACT inhaler Inhale 2 puffs into the lungs every 6 (six) hours as needed for wheezing or shortness of breath. 06/28/17   Mannam, Colbert Coyer, MD  amLODipine (NORVASC) 5 MG tablet Take 5 mg by mouth daily. 04/06/19   [provider]  aspirin EC 81 MG tablet Take 1 tablet (81 mg total) by mouth daily. 11/23/19 02/21/20  Tolia, Sunit, DO  Bempedoic Acid-Ezetimibe (NEXLIZET) 180-10 MG TABS Take 1 tablet by mouth daily. 11/23/19   Tolia, Sunit, DO  betamethasone dipropionate 0.05 % cream Apply topically 2 (two) times daily.    [provider]  Cholecalciferol (VITAMIN D3) 2000 units TABS Take by mouth.    [provider]  clindamycin (CLEOCIN) 300 MG capsule Take 300 mg by mouth as needed. Pre dental procedures 10/28/19   [provider]  EPIPEN 2-PAK 0.3 MG/0.3ML SOAJ injection Inject 0.3 mg into the muscle as needed (for allergic reactions).  06/14/14   [provider]  ezetimibe (ZETIA) 10 MG tablet TAKE 1 TABLET BY MOUTH EVERY DAY 12/01/19   Tolia, Sunit, DO  folic acid (FOLVITE) 1 MG tablet 2 TABLET BY MOUTH ONCE DAILY 08/13/14   [provider]  ibuprofen (ADVIL) 600 MG tablet  Take 1 tablet (600 mg total) by mouth every 6 (six) hours as needed. 07/03/19   Wieters, Hallie C, PA-C  lansoprazole (PREVACID) 15 MG capsule Take 15 mg by mouth daily at 12 noon.    [provider]  levocetirizine (XYZAL) 5 MG tablet Take 5 mg by mouth at bedtime.  05/21/12   [provider]  methotrexate (RHEUMATREX) 2.5 MG tablet 7 tablets once a week 04/19/17   [provider]  metoprolol succinate (TOPROL-XL) 50 MG 24 hr tablet TAKE 1 TABLET (50 MG TOTAL) BY MOUTH DAILY. TAKE WITH OR IMMEDIATELY FOLLOWING A MEAL. 05/27/19 11/23/19  Toniann Fail, NP  simvastatin (ZOCOR) 10 MG tablet TAKE 1 TABLET BY MOUTH EVERYDAY AT BEDTIME 12/28/19   Tolia, Sunit, DO  TALTZ 80 MG/ML SOAJ  Inject into the skin. 12/24/19   [provider]  tazarotene (AVAGE) 0.1 % cream APPLY TO AFFECTED AREA EVERY DAY AT NIGHT 03/12/19   [provider]  triamcinolone ointment (KENALOG) 0.1 % Apply 1 application topically 2 (two) times daily. 01/10/20   Eustace Moore, MD  valsartan-hydrochlorothiazide (DIOVAN-HCT) 160-12.5 MG tablet Take 1 tablet by mouth daily. 04/06/19   [provider]    Family History Family History  Problem Relation Age of Onset  . Heart disease Father   . Hypertension Sister   . Lupus Brother   . Cancer Brother        Colon    Social History Social History   Tobacco Use  . Smoking status: Former Smoker    Packs/day: 1.00    Years: 40.00    Pack years: 40.00    Types: Cigarettes    Quit date: 02/20/2003    Years since quitting: 16.8  . Smokeless tobacco: Never Used  Vaping Use  . Vaping Use: Never used  Substance Use Topics  . Alcohol use: No  . Drug use: No     Allergies   Peanut-containing drug products, Shellfish allergy, Penicillins, Sulfa antibiotics, Allegra [fexofenadine], Leflunomide, and Lyrica [pregabalin]   Review of Systems Review of Systems See HPI  Physical Exam Triage Vital Signs ED Triage Vitals  Enc Vitals Group     BP 01/10/20 1039 116/71     Pulse Rate 01/10/20 1028 67     Resp 01/10/20 1028 19     Temp 01/10/20 1039 98.7 F (37.1 C)     Temp Source 01/10/20 1039 Oral     SpO2 01/10/20 1028 97 %     Weight --      Height --      Head Circumference --      Peak Flow --      Pain Score 01/10/20 1035 0     Pain Loc --      Pain Edu? --      Excl. in GC? --    No data found.  Updated Vital Signs BP 116/71 (BP Location: Left Arm)   Pulse 67   Temp 98.7 F (37.1 C) (Oral)   Resp 19   SpO2 97%   Large erythematous indurated area just above the knee on left thigh    Physical Exam Constitutional:      General: She is not in acute distress.    Appearance: She is well-developed and  normal weight.  HENT:     Head: Normocephalic and atraumatic.     Mouth/Throat:     Comments: Mask is in place Eyes:     Conjunctiva/sclera: Conjunctivae normal.  Pupils: Pupils are equal, round, and reactive to light.  Cardiovascular:     Rate and Rhythm: Normal rate.  Pulmonary:     Effort: Pulmonary effort is normal. No respiratory distress.     Breath sounds: Normal breath sounds.     Comments: Lungs are clear Abdominal:     General: There is no distension.     Palpations: Abdomen is soft.  Musculoskeletal:        General: Normal range of motion.     Cervical back: Normal range of motion.  Skin:    General: Skin is warm and dry.     Comments: Pustules, in various stages of healing, noted on palms and soles See picture  Neurological:     Mental Status: She is alert.      UC Treatments / Results  Labs (all labs ordered are listed, but only abnormal results are displayed) Labs Reviewed - No data to display  EKG   Radiology No results found.  Procedures Procedures (including critical care time)  Medications Ordered in UC Medications - No data to display  Initial Impression / Assessment and Plan / UC Course  I have reviewed the triage vital signs and the nursing notes.  Pertinent labs & imaging results that were available during my care of the patient were reviewed by me and considered in my medical decision making (see chart for details).     Final Clinical Impressions(s) / UC Diagnoses   Final diagnoses:  Reaction to shot, initial encounter     Discharge Instructions     Increase the xyxal to 2 x a day May take the benadryl at night Rub in the ointment 2 x a day  Call your rheumatologist about the shot reaction Do not take any more shots without discussion with Dr Dierdre Forth    ED Prescriptions    Medication Sig Dispense Auth. Provider   triamcinolone ointment (KENALOG) 0.1 % Apply 1 application topically 2 (two) times daily. 15 g Eustace Moore, MD     PDMP not reviewed this encounter.   Eustace Moore, MD 01/10/20 1322

## 2020-01-10 NOTE — Discharge Instructions (Addendum)
Increase the xyxal to 2 x a day May take the benadryl at night Rub in the ointment 2 x a day  Call your rheumatologist about the shot reaction Do not take any more shots without discussion with Dr Dierdre Forth

## 2020-01-13 ENCOUNTER — Other Ambulatory Visit: Payer: Self-pay | Admitting: Cardiology

## 2020-01-13 DIAGNOSIS — I2510 Atherosclerotic heart disease of native coronary artery without angina pectoris: Secondary | ICD-10-CM

## 2020-01-28 DIAGNOSIS — L4059 Other psoriatic arthropathy: Secondary | ICD-10-CM | POA: Diagnosis not present

## 2020-01-28 DIAGNOSIS — M150 Primary generalized (osteo)arthritis: Secondary | ICD-10-CM | POA: Diagnosis not present

## 2020-01-28 DIAGNOSIS — M5136 Other intervertebral disc degeneration, lumbar region: Secondary | ICD-10-CM | POA: Diagnosis not present

## 2020-01-28 DIAGNOSIS — M25512 Pain in left shoulder: Secondary | ICD-10-CM | POA: Diagnosis not present

## 2020-01-28 DIAGNOSIS — Z6832 Body mass index (BMI) 32.0-32.9, adult: Secondary | ICD-10-CM | POA: Diagnosis not present

## 2020-01-28 DIAGNOSIS — L401 Generalized pustular psoriasis: Secondary | ICD-10-CM | POA: Diagnosis not present

## 2020-01-28 DIAGNOSIS — E669 Obesity, unspecified: Secondary | ICD-10-CM | POA: Diagnosis not present

## 2020-01-28 DIAGNOSIS — R0602 Shortness of breath: Secondary | ICD-10-CM | POA: Diagnosis not present

## 2020-01-28 DIAGNOSIS — M0589 Other rheumatoid arthritis with rheumatoid factor of multiple sites: Secondary | ICD-10-CM | POA: Diagnosis not present

## 2020-02-03 DIAGNOSIS — M0589 Other rheumatoid arthritis with rheumatoid factor of multiple sites: Secondary | ICD-10-CM | POA: Diagnosis not present

## 2020-02-09 ENCOUNTER — Encounter: Payer: Self-pay | Admitting: Pulmonary Disease

## 2020-02-09 ENCOUNTER — Other Ambulatory Visit: Payer: Self-pay

## 2020-02-09 ENCOUNTER — Ambulatory Visit (INDEPENDENT_AMBULATORY_CARE_PROVIDER_SITE_OTHER): Payer: Medicare Other | Admitting: Pulmonary Disease

## 2020-02-09 VITALS — BP 114/66 | HR 72 | Temp 99.0°F | Ht 66.0 in | Wt 200.2 lb

## 2020-02-09 DIAGNOSIS — I2510 Atherosclerotic heart disease of native coronary artery without angina pectoris: Secondary | ICD-10-CM

## 2020-02-09 DIAGNOSIS — M069 Rheumatoid arthritis, unspecified: Secondary | ICD-10-CM

## 2020-02-09 DIAGNOSIS — J849 Interstitial pulmonary disease, unspecified: Secondary | ICD-10-CM | POA: Diagnosis not present

## 2020-02-09 DIAGNOSIS — R0600 Dyspnea, unspecified: Secondary | ICD-10-CM | POA: Diagnosis not present

## 2020-02-09 LAB — CBC WITH DIFFERENTIAL/PLATELET
Basophils Absolute: 0 10*3/uL (ref 0.0–0.1)
Basophils Relative: 0.4 % (ref 0.0–3.0)
Eosinophils Absolute: 0.6 10*3/uL (ref 0.0–0.7)
Eosinophils Relative: 9.2 % — ABNORMAL HIGH (ref 0.0–5.0)
HCT: 34.7 % — ABNORMAL LOW (ref 36.0–46.0)
Hemoglobin: 11.2 g/dL — ABNORMAL LOW (ref 12.0–15.0)
Lymphocytes Relative: 28 % (ref 12.0–46.0)
Lymphs Abs: 1.8 10*3/uL (ref 0.7–4.0)
MCHC: 32.2 g/dL (ref 30.0–36.0)
MCV: 91.8 fl (ref 78.0–100.0)
Monocytes Absolute: 0.5 10*3/uL (ref 0.1–1.0)
Monocytes Relative: 7.2 % (ref 3.0–12.0)
Neutro Abs: 3.6 10*3/uL (ref 1.4–7.7)
Neutrophils Relative %: 55.2 % (ref 43.0–77.0)
Platelets: 335 10*3/uL (ref 150.0–400.0)
RBC: 3.78 Mil/uL — ABNORMAL LOW (ref 3.87–5.11)
RDW: 16.4 % — ABNORMAL HIGH (ref 11.5–15.5)
WBC: 6.6 10*3/uL (ref 4.0–10.5)

## 2020-02-09 MED ORDER — BUDESONIDE-FORMOTEROL FUMARATE 160-4.5 MCG/ACT IN AERO
2.0000 | INHALATION_SPRAY | Freq: Two times a day (BID) | RESPIRATORY_TRACT | 5 refills | Status: DC
Start: 2020-02-09 — End: 2023-08-20

## 2020-02-09 NOTE — Patient Instructions (Signed)
We will check some labs today including CBC differential, IgE Resume Symbicort at 160 Schedule high-res CT and PFTs for evaluation for lung Follow-up in 1 to 2 months.

## 2020-02-09 NOTE — Progress Notes (Signed)
Cheyenne Gould    [id number]    March 13, 1945  Primary Care Physician:Bland, Adrian Saran, MD  Referring Physician: [name], MD [address] [place],  [place] [address]  Chief complaint: Follow up for dyspnea  HPI: 74 year old with history of rheumatoid, psoriatic arthritis, pustular psoriasis, GERD, hypertension.  Evaluated in October 2020 for worsening dyspnea.  She had a high-res CT which showed mild centrilobular nodularity suggestive of hypersensitivity pneumonitis.  However ILD questionnare does not reveal any significant exposures.  Follow-up imaging in April showed improvement in nodularity.  She follows with Dr. Dierdre Forth for arthritis.  She had been previously treated with [place] which had to be stopped due to side effects and Remicade which had to be stopped after she developed sepsis and pneumonia.  She also had been tried on [place] with no efficacy.  She is currently on Simponi and methotrexate.  She was evaluated by Dr. Jacinto Halim had an echo and stress test which are normal as per the patient   She has history of GERD and is on Prilosec.  Notices that dyspnea is better when she stops the Prilosec however has recurrence of heartburn when off Prilosec.  Has had a 30 pound weight gain over the past 1 year but feels that her dyspnea is not related to the weight gain.  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  Quit in 2005 Travel History: Lived in [place] for 5 years otherwise was a resident of [place].  No significant travel.  Interim history: Since her last visit she has been taken off Simponi as it was no longer effective.  Tried on Taltz injections from September to November 2021 but had to stop due to allergic  reaction  She has been started on Cimzia at this month.  Continues on methotrexate for rheumatoid arthritis  Complains of worsening dyspnea for the past 6 months.  No wheezing, mucus production, fevers, chills She is supposed to be on Symbicort but stopped for unclear reason in early 2021  Outpatient Encounter Medications as of 02/09/2020  Medication Sig  . acetaminophen (TYLENOL) 500 MG tablet Take 500 mg by mouth every 6 (six) hours as needed for mild pain or headache.  Marland Kitchen amLODipine (NORVASC) 5 MG tablet Take 5 mg by mouth daily.  Marland Kitchen aspirin 81 MG EC tablet TAKE 1 TABLET BY MOUTH EVERY DAY  . Bempedoic Acid-Ezetimibe (NEXLIZET) 180-10 MG TABS Take 1 tablet by mouth daily.  . betamethasone dipropionate 0.05 % cream Apply topically 2 (two) times daily.  . Certolizumab Pegol (CIMZIA [place]) Inject into the skin every 30 (thirty) days.  . Cholecalciferol (VITAMIN D3) 2000 units TABS Take by mouth.  . clindamycin (CLEOCIN) 300 MG capsule Take 300 mg by mouth as needed. Pre dental procedures  . EPIPEN 2-PAK 0.3 MG/0.3ML SOAJ injection Inject 0.3 mg into the muscle as needed (for allergic reactions).   . ezetimibe (ZETIA) 10 MG tablet TAKE 1 TABLET BY MOUTH EVERY DAY  . folic acid (FOLVITE) 1 MG tablet 2 TABLET BY MOUTH ONCE DAILY  . ibuprofen (ADVIL) 600 MG tablet Take 1 tablet (600 mg total) by mouth every 6 (six) hours as needed.  . lansoprazole (PREVACID) 15 MG  capsule Take 15 mg by mouth daily at 12 noon.  Marland Kitchen levocetirizine (XYZAL) 5 MG tablet Take 5 mg by mouth at bedtime.   . methotrexate (RHEUMATREX) 2.5 MG tablet 7 tablets once a week  . metoprolol succinate (TOPROL-XL) 50 MG 24 hr tablet TAKE 1 TABLET (50 MG TOTAL) BY MOUTH DAILY. TAKE WITH OR IMMEDIATELY FOLLOWING A MEAL.  . simvastatin (ZOCOR) 10 MG tablet TAKE 1 TABLET BY MOUTH EVERYDAY AT BEDTIME  . tazarotene (AVAGE) 0.1 % cream APPLY TO AFFECTED AREA EVERY DAY AT NIGHT  . triamcinolone ointment (KENALOG) 0.1 % Apply 1 application  topically 2 (two) times daily.  . valsartan-hydrochlorothiazide (DIOVAN-HCT) 160-12.5 MG tablet Take 1 tablet by mouth daily.  . [DISCONTINUED] TALTZ 80 MG/ML SOAJ Inject into the skin.  Marland Kitchen albuterol (PROAIR HFA) 108 (90 Base) MCG/ACT inhaler Inhale 2 puffs into the lungs every 6 (six) hours as needed for wheezing or shortness of breath. (Patient not taking: Reported on 02/09/2020)   No facility-administered encounter medications on file as of 02/09/2020.   Physical Exam: Blood pressure 114/66, pulse 72, temperature 99 F (37.2 C), temperature source Skin, height 5\' 6"  (1.676 m), weight 200 lb 3.2 oz (90.8 kg), SpO2 98 %. Gen:      No acute distress HEENT:  EOMI, sclera anicteric Neck:     No masses; no thyromegaly Lungs:    Clear to auscultation bilaterally; normal respiratory effort CV:         Regular rate and rhythm; no murmurs Abd:      + bowel sounds; soft, non-tender; no palpable masses, no distension Ext:    No edema; adequate peripheral perfusion Skin:      Warm and dry; no rash Neuro: alert and oriented x 3 Psych: normal mood and affect  Data Reviewed: Imaging Chest x-ray 06/10/15-bilateral lung infiltrates Chest x-ray 06/25/15-resolution of right upper lobe infiltrate and left basilar pneumonia.  There is linear atelectasis at the right base with right diaphragm elevation. High-resolution CT 06/05/2017- mild atrophy, no evidence of interstitial lung disease.  Subcentimeter pulmonary nodule, coronary atherosclerosis.  Mild scarring in the right middle, lower lobes CT chest 09/25/2017- stable subcentimeter pulmonary nodules, coronary atherosclerosis. High-resolution CT 12/19/2018-mild centrilobular nodularity, coronary artery atherosclerosis, aortic atherosclerosis CT chest 4/40/21-improvement in centrilobular nodularity. I have reviewed the images personally.  PFTs 06/28/2017 FVC 1.76 [7%), FEV1 1.58 [78%], F/F 90, TLC 69%, DLCO 54%, DLCO/VA 83% Mild to moderate diffusion defect  that corrects for alveolar volume.  FENO 06/28/2017-9  Labs: CCP 01/21/2019-35,  Rheumatoid factor 01/21/19-25 ANA 01/21/2019-negative  CBC 01/21/2019-WBC 7, eos 4.7%, absolute eosinophil count 329  Assessment:  Follow-up for dyspnea CT reviewed with mild centrilobular nodularity of unclear etiology.  Imaging is suggestive of hypersensitivity pneumonitis but she does not have any exposure history and follow-up imaging showed some improvement She has history of rheumatoid arthritis so RA ILD is on the differential but imaging is not suggestive  Patient previously declined a bronchoscope Given worsening symptoms we will reassess with a high-res CT and PFTs Suspect she may have reactive airway disease or asthma  Check CBC differential, IgE and resume Symbicort 160  Subcentimeter pulmonary nodule CT scan from August 2019 reviewed with stable lung nodules are likely benign  Coronary atherosclerosis on CT scan Follows with Dr. September 2019, cardiology  Plan/Recommendations: - Resume Symbicort, albuterol inhaler as needed - CBC, IgE - High-res CT   Jacinto Halim MD [facility] 02/09/2020, 11:23 AM  CC: 02/11/2020, MD

## 2020-02-10 LAB — IGE: IgE (Immunoglobulin E), Serum: 365 kU/L — ABNORMAL HIGH (ref <=114)

## 2020-02-17 DIAGNOSIS — M0589 Other rheumatoid arthritis with rheumatoid factor of multiple sites: Secondary | ICD-10-CM | POA: Diagnosis not present

## 2020-03-02 DIAGNOSIS — M0589 Other rheumatoid arthritis with rheumatoid factor of multiple sites: Secondary | ICD-10-CM | POA: Diagnosis not present

## 2020-03-04 DIAGNOSIS — Z91010 Allergy to peanuts: Secondary | ICD-10-CM | POA: Diagnosis not present

## 2020-03-04 DIAGNOSIS — Z91013 Allergy to seafood: Secondary | ICD-10-CM | POA: Diagnosis not present

## 2020-03-04 DIAGNOSIS — Z91018 Allergy to other foods: Secondary | ICD-10-CM | POA: Diagnosis not present

## 2020-03-04 DIAGNOSIS — R21 Rash and other nonspecific skin eruption: Secondary | ICD-10-CM | POA: Diagnosis not present

## 2020-03-09 DIAGNOSIS — L403 Pustulosis palmaris et plantaris: Secondary | ICD-10-CM | POA: Diagnosis not present

## 2020-03-09 DIAGNOSIS — Z79899 Other long term (current) drug therapy: Secondary | ICD-10-CM | POA: Diagnosis not present

## 2020-03-11 ENCOUNTER — Ambulatory Visit
Admission: RE | Admit: 2020-03-11 | Discharge: 2020-03-11 | Disposition: A | Discharge status: Home / Self Care | Payer: Medicare Other | Source: Ambulatory Visit | Attending: Pulmonary Disease | Admitting: Pulmonary Disease

## 2020-03-11 DIAGNOSIS — J849 Interstitial pulmonary disease, unspecified: Secondary | ICD-10-CM

## 2020-03-11 DIAGNOSIS — R0602 Shortness of breath: Secondary | ICD-10-CM | POA: Diagnosis not present

## 2020-03-22 DIAGNOSIS — Z7189 Other specified counseling: Secondary | ICD-10-CM | POA: Diagnosis not present

## 2020-03-22 DIAGNOSIS — Z0000 Encounter for general adult medical examination without abnormal findings: Secondary | ICD-10-CM | POA: Diagnosis not present

## 2020-03-22 DIAGNOSIS — J069 Acute upper respiratory infection, unspecified: Secondary | ICD-10-CM | POA: Diagnosis not present

## 2020-03-28 ENCOUNTER — Encounter: Payer: Self-pay | Admitting: *Deleted

## 2020-03-30 DIAGNOSIS — Z79899 Other long term (current) drug therapy: Secondary | ICD-10-CM | POA: Diagnosis not present

## 2020-03-30 DIAGNOSIS — M0589 Other rheumatoid arthritis with rheumatoid factor of multiple sites: Secondary | ICD-10-CM | POA: Diagnosis not present

## 2020-03-30 IMAGING — DX DG CHEST 2V
2 series · 2 of 2 positions shown · non-contrast
Comparison: 07/05/2015

CLINICAL DATA: Congested, cough.

EXAM:
CHEST - 2 VIEW

[chest pa]
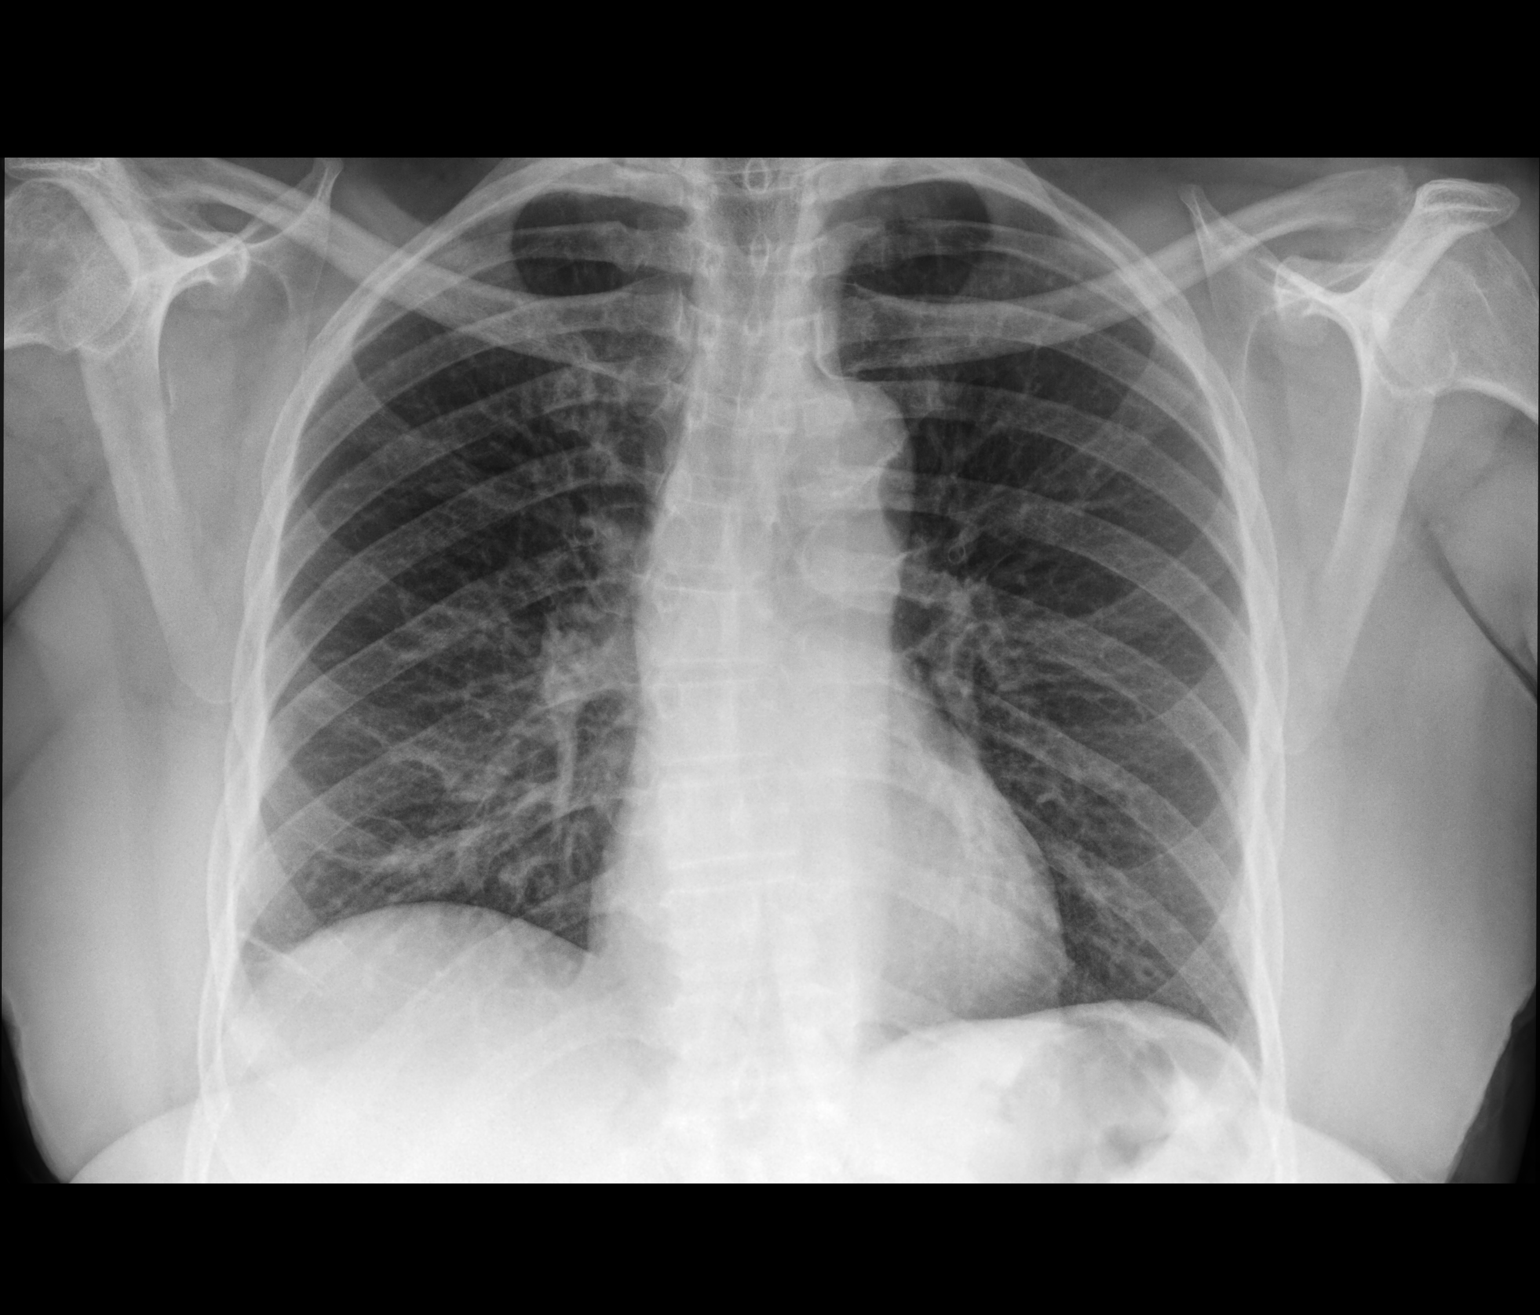

[chest lat]
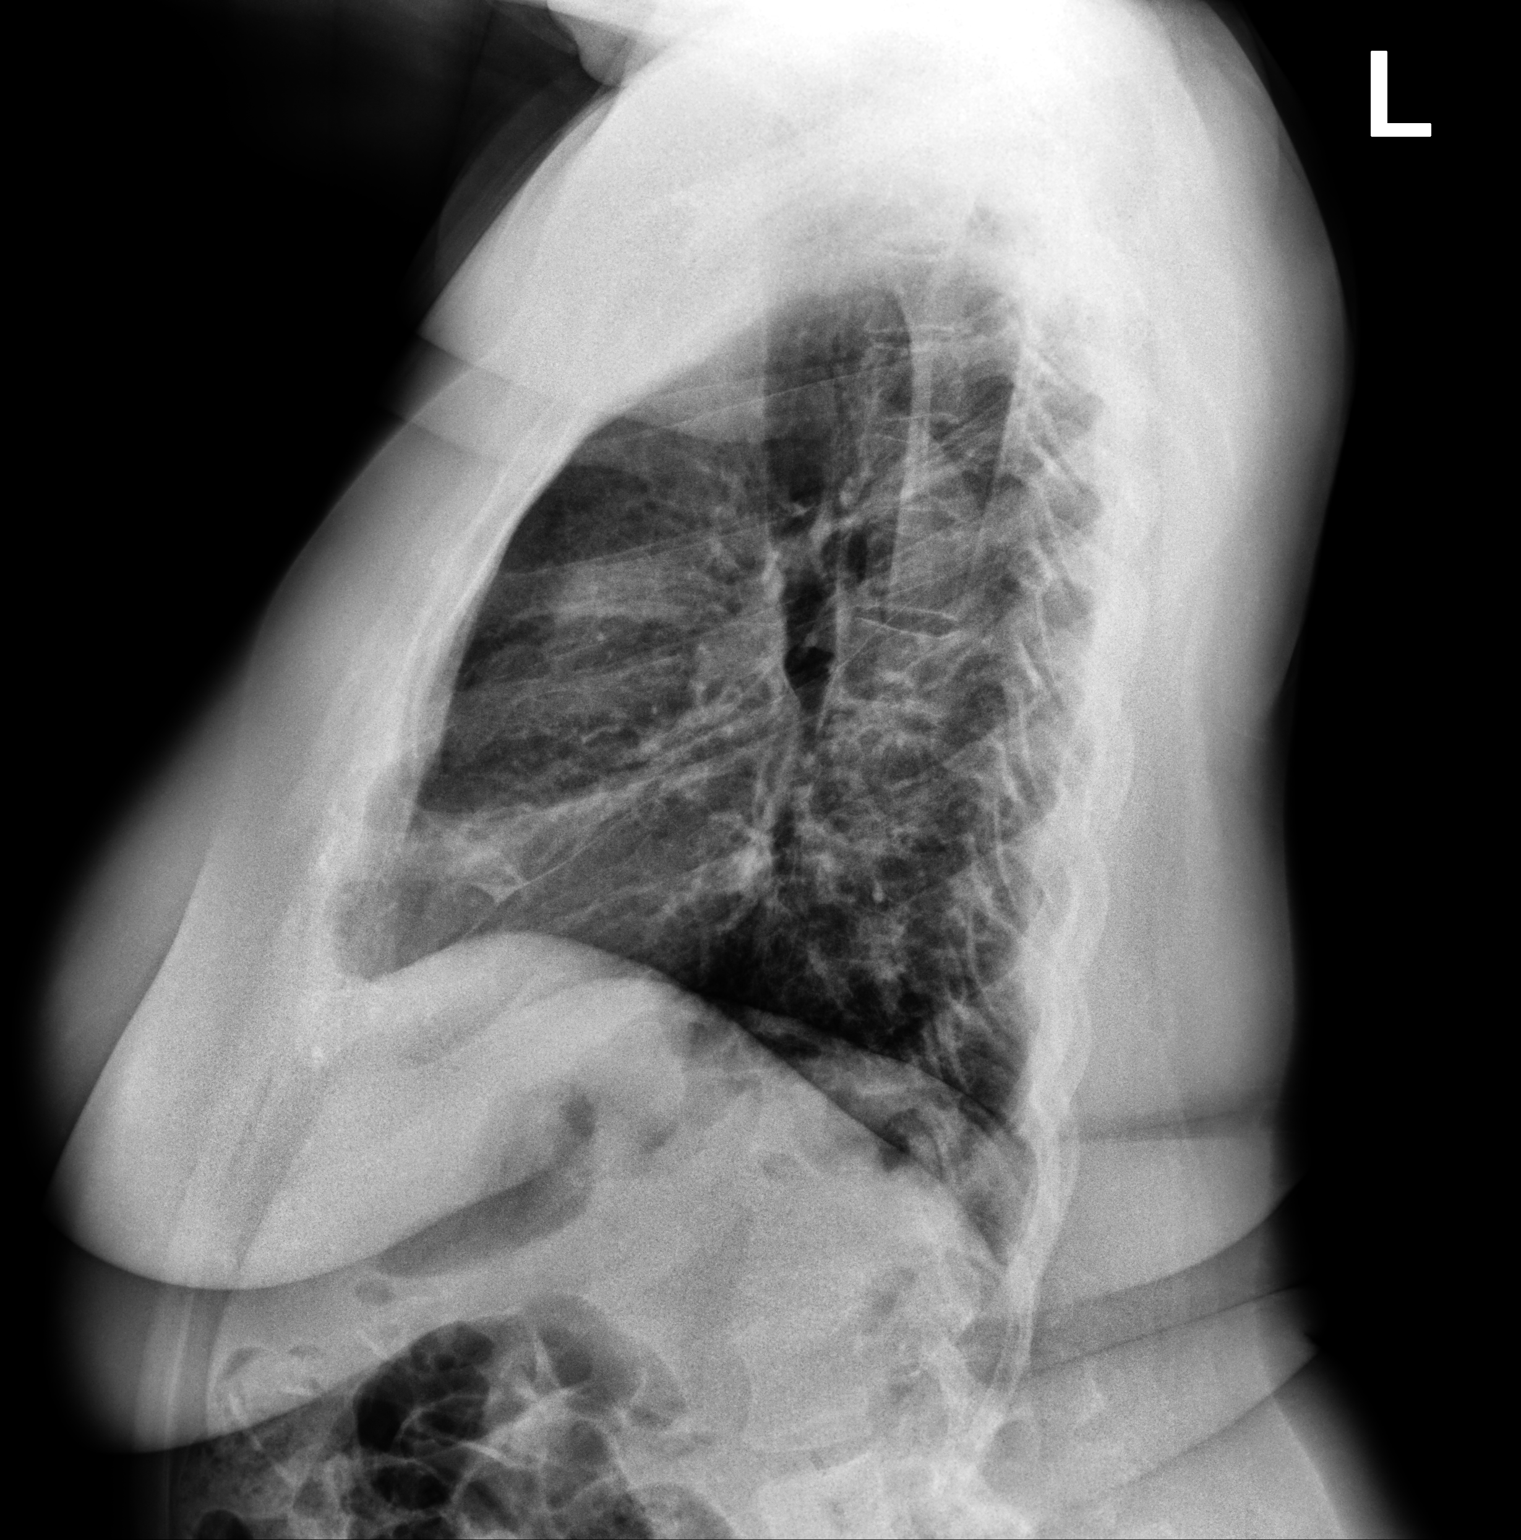

[2 of 2 positions shown; findings below may reference images not displayed]

FINDINGS: Cardiomediastinal contours and hilar structures are unremarkable.

Linear opacity in right middle and right lower lobe similar as
compared to recent CT.

No signs of consolidation or evidence of pleural effusion.

No signs of pleural effusion.

Visualized skeletal structures are unremarkable.
IMPRESSION: Presumed scarring in the right lung base similar to previous
studies. Difficult to exclude developing infection based on
comparison with the previous scout from 12/19/2018. No signs of
dense consolidation or pleural effusion.

## 2020-03-31 DIAGNOSIS — M25512 Pain in left shoulder: Secondary | ICD-10-CM | POA: Diagnosis not present

## 2020-03-31 DIAGNOSIS — E669 Obesity, unspecified: Secondary | ICD-10-CM | POA: Diagnosis not present

## 2020-03-31 DIAGNOSIS — M150 Primary generalized (osteo)arthritis: Secondary | ICD-10-CM | POA: Diagnosis not present

## 2020-03-31 DIAGNOSIS — Z6832 Body mass index (BMI) 32.0-32.9, adult: Secondary | ICD-10-CM | POA: Diagnosis not present

## 2020-03-31 DIAGNOSIS — L4059 Other psoriatic arthropathy: Secondary | ICD-10-CM | POA: Diagnosis not present

## 2020-03-31 DIAGNOSIS — L401 Generalized pustular psoriasis: Secondary | ICD-10-CM | POA: Diagnosis not present

## 2020-03-31 DIAGNOSIS — R0602 Shortness of breath: Secondary | ICD-10-CM | POA: Diagnosis not present

## 2020-03-31 DIAGNOSIS — M5136 Other intervertebral disc degeneration, lumbar region: Secondary | ICD-10-CM | POA: Diagnosis not present

## 2020-03-31 DIAGNOSIS — M0589 Other rheumatoid arthritis with rheumatoid factor of multiple sites: Secondary | ICD-10-CM | POA: Diagnosis not present

## 2020-04-01 DIAGNOSIS — F064 Anxiety disorder due to known physiological condition: Secondary | ICD-10-CM | POA: Diagnosis not present

## 2020-04-01 DIAGNOSIS — J441 Chronic obstructive pulmonary disease with (acute) exacerbation: Secondary | ICD-10-CM | POA: Diagnosis not present

## 2020-04-01 DIAGNOSIS — R5382 Chronic fatigue, unspecified: Secondary | ICD-10-CM | POA: Diagnosis not present

## 2020-04-01 DIAGNOSIS — E785 Hyperlipidemia, unspecified: Secondary | ICD-10-CM | POA: Diagnosis not present

## 2020-04-01 DIAGNOSIS — M0540 Rheumatoid myopathy with rheumatoid arthritis of unspecified site: Secondary | ICD-10-CM | POA: Diagnosis not present

## 2020-04-01 DIAGNOSIS — R0602 Shortness of breath: Secondary | ICD-10-CM | POA: Diagnosis not present

## 2020-04-02 ENCOUNTER — Other Ambulatory Visit (HOSPITAL_COMMUNITY)
Admission: RE | Admit: 2020-04-02 | Discharge: 2020-04-02 | Disposition: A | Discharge status: Home / Self Care | Payer: Medicare Other | Source: Ambulatory Visit | Attending: Pulmonary Disease | Admitting: Pulmonary Disease

## 2020-04-02 DIAGNOSIS — Z20822 Contact with and (suspected) exposure to covid-19: Secondary | ICD-10-CM | POA: Insufficient documentation

## 2020-04-02 DIAGNOSIS — Z01812 Encounter for preprocedural laboratory examination: Secondary | ICD-10-CM | POA: Insufficient documentation

## 2020-04-02 LAB — SARS CORONAVIRUS 2 (TAT 6-24 HRS): SARS Coronavirus 2: NEGATIVE

## 2020-04-05 ENCOUNTER — Other Ambulatory Visit: Payer: Self-pay

## 2020-04-05 ENCOUNTER — Ambulatory Visit (INDEPENDENT_AMBULATORY_CARE_PROVIDER_SITE_OTHER): Payer: Medicare Other | Admitting: Pulmonary Disease

## 2020-04-05 ENCOUNTER — Encounter: Payer: Self-pay | Admitting: Pulmonary Disease

## 2020-04-05 VITALS — BP 116/62 | HR 65 | Temp 97.2°F | Ht 66.0 in | Wt 201.0 lb

## 2020-04-05 DIAGNOSIS — J4540 Moderate persistent asthma, uncomplicated: Secondary | ICD-10-CM | POA: Diagnosis not present

## 2020-04-05 DIAGNOSIS — J849 Interstitial pulmonary disease, unspecified: Secondary | ICD-10-CM

## 2020-04-05 LAB — PULMONARY FUNCTION TEST
DL/VA % pred: 102 %
DL/VA: 4.15 ml/min/mmHg/L
DLCO cor % pred: 68 %
DLCO cor: 14.1 ml/min/mmHg
DLCO unc % pred: 68 %
DLCO unc: 14.1 ml/min/mmHg
FEF 25-75 Post: 3.55 L/sec
FEF 25-75 Pre: 2.17 L/sec
FEF2575-%Change-Post: 63 %
FEF2575-%Pred-Post: 205 %
FEF2575-%Pred-Pre: 126 %
FEV1-%Change-Post: 12 %
FEV1-%Pred-Post: 92 %
FEV1-%Pred-Pre: 82 %
FEV1-Post: 1.79 L
FEV1-Pre: 1.6 L
FEV1FVC-%Change-Post: 3 %
FEV1FVC-%Pred-Pre: 112 %
FEV6-%Change-Post: 8 %
FEV6-%Pred-Post: 83 %
FEV6-%Pred-Pre: 77 %
FEV6-Post: 2.02 L
FEV6-Pre: 1.85 L
FEV6FVC-%Pred-Post: 103 %
FEV6FVC-%Pred-Pre: 103 %
FVC-%Change-Post: 8 %
FVC-%Pred-Post: 80 %
FVC-%Pred-Pre: 74 %
FVC-Post: 2.02 L
FVC-Pre: 1.85 L
Post FEV1/FVC ratio: 89 %
Post FEV6/FVC ratio: 100 %
Pre FEV1/FVC ratio: 86 %
Pre FEV6/FVC Ratio: 100 %
RV % pred: 65 %
RV: 1.54 L
TLC % pred: 64 %
TLC: 3.45 L

## 2020-04-05 MED ORDER — MONTELUKAST SODIUM 10 MG PO TABS
10.0000 mg | ORAL_TABLET | Freq: Every day | ORAL | 5 refills | Status: DC
Start: 2020-04-05 — End: 2020-05-24

## 2020-04-05 NOTE — Patient Instructions (Signed)
I have reviewed your imaging which does not show any significant abnormality  Your lung function test indicate that you may have asthma Continue the Symbicort We will start Singulair  Follow-up in 6 months.

## 2020-04-05 NOTE — Progress Notes (Signed)
Full PFT performed today. °

## 2020-04-05 NOTE — Addendum Note (Signed)
Addended by: Jacquiline Doe on: 04/05/2020 10:45 AM   Modules accepted: Orders

## 2020-04-05 NOTE — Progress Notes (Signed)
Cheyenne Gould    [id number]    Feb 10, 1946  Primary Care Physician:Bland, Adrian Saran, MD  Referring Physician: [name], MD [address] [place],  [place] [address]  Chief complaint: Follow up for asthma, dyspnea.  HPI: 75 year old with history of rheumatoid, psoriatic arthritis, pustular psoriasis, GERD, hypertension.  Evaluated in October 2020 for worsening dyspnea.  She had a high-res CT which showed mild centrilobular nodularity suggestive of hypersensitivity pneumonitis.  However ILD questionnare does not reveal any significant exposures.  Follow-up imaging in showed improvement in nodularity.  She follows with Dr. Dierdre Forth for arthritis.  She had been previously treated with [place] which had to be stopped due to side effects and Remicade which had to be stopped after she developed sepsis and pneumonia.  She also had been tried on [place] with no efficacy, then tried Simponi which was ineffective. Tried on Taltz injections from September to November 2021 but had to stop due to allergic reaction.  She is currently on Cimzia and methotrexate  She was evaluated by Dr. Jacinto Halim had an echo and stress test which are normal as per the patient   She has history of GERD and is on Prilosec.  Notices that dyspnea is better when she stops the Prilosec however has recurrence of heartburn when off Prilosec.  Has had a 30 pound weight gain over the past 1 year but feels that her dyspnea is not related to the weight gain.  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  Quit in 2005 Travel History: Lived in [place] for 5 years otherwise was a resident of [place].  No significant travel.  Interim history: Restarted on Symbicort at last visit  with improvement in breathing She feels that her breathing gets worse whenever she stops the inhaler.  No new complaints today  Outpatient Encounter Medications as of 04/05/2020  Medication Sig  . acetaminophen (TYLENOL) 500 MG tablet Take 500 mg by mouth every 6 (six) hours as needed for mild pain or headache.  . albuterol (PROAIR HFA) 108 (90 Base) MCG/ACT inhaler Inhale 2 puffs into the lungs every 6 (six) hours as needed for wheezing or shortness of breath.  Marland Kitchen amLODipine (NORVASC) 5 MG tablet Take 5 mg by mouth daily.  Marland Kitchen aspirin 81 MG EC tablet TAKE 1 TABLET BY MOUTH EVERY DAY  . Bempedoic Acid-Ezetimibe (NEXLIZET) 180-10 MG TABS Take 1 tablet by mouth daily.  . betamethasone dipropionate 0.05 % cream Apply topically 2 (two) times daily.  . budesonide-formoterol (SYMBICORT) 160-4.5 MCG/ACT inhaler Inhale 2 puffs into the lungs 2 (two) times daily.  . Certolizumab Pegol (CIMZIA [place]) Inject into the skin every 30 (thirty) days.  . Cholecalciferol (VITAMIN D3) 2000 units TABS Take by mouth.  . EPIPEN 2-PAK 0.3 MG/0.3ML SOAJ injection Inject 0.3 mg into the muscle as needed (for allergic reactions).   . ezetimibe (ZETIA) 10 MG tablet TAKE 1 TABLET BY MOUTH EVERY DAY  . folic acid (FOLVITE) 1 MG tablet 2 TABLET BY MOUTH ONCE DAILY  . ibuprofen (ADVIL) 600 MG tablet Take 1 tablet (600 mg total) by mouth every 6 (six) hours as needed.  . lansoprazole (PREVACID) 15 MG capsule Take 15 mg by  mouth daily at 12 noon.  Marland Kitchen levocetirizine (XYZAL) 5 MG tablet Take 5 mg by mouth at bedtime.   . methotrexate (RHEUMATREX) 2.5 MG tablet 7 tablets once a week  . metoprolol succinate (TOPROL-XL) 50 MG 24 hr tablet TAKE 1 TABLET (50 MG TOTAL) BY MOUTH DAILY. TAKE WITH OR IMMEDIATELY FOLLOWING A MEAL.  . simvastatin (ZOCOR) 10 MG tablet TAKE 1 TABLET BY MOUTH EVERYDAY AT BEDTIME  . tazarotene (AVAGE) 0.1 % cream APPLY TO AFFECTED AREA EVERY DAY AT NIGHT  . triamcinolone ointment (KENALOG) 0.1 % Apply 1  application topically 2 (two) times daily.  . valsartan-hydrochlorothiazide (DIOVAN-HCT) 160-12.5 MG tablet Take 1 tablet by mouth daily.  . clindamycin (CLEOCIN) 300 MG capsule Take 300 mg by mouth as needed. Pre dental procedures (Patient not taking: Reported on 04/05/2020)   No facility-administered encounter medications on file as of 04/05/2020.   Physical Exam: Blood pressure 114/66, pulse 72, temperature 99 F (37.2 C), temperature source Skin, height 5\' 6"  (1.676 m), weight 200 lb 3.2 oz (90.8 kg), SpO2 98 %. Gen:      No acute distress HEENT:  EOMI, sclera anicteric Neck:     No masses; no thyromegaly Lungs:    Clear to auscultation bilaterally; normal respiratory effort CV:         Regular rate and rhythm; no murmurs Abd:      + bowel sounds; soft, non-tender; no palpable masses, no distension Ext:    No edema; adequate peripheral perfusion Skin:      Warm and dry; no rash Neuro: alert and oriented x 3 Psych: normal mood and affect  Data Reviewed: Imaging Chest x-ray 06/10/15-bilateral lung infiltrates Chest x-ray 06/25/15-resolution of right upper lobe infiltrate and left basilar pneumonia.  There is linear atelectasis at the right base with right diaphragm elevation. High-resolution CT 06/05/2017- mild atrophy, no evidence of interstitial lung disease.  Subcentimeter pulmonary nodule, coronary atherosclerosis.  Mild scarring in the right middle, lower lobes CT chest 09/25/2017- stable subcentimeter pulmonary nodules, coronary atherosclerosis. High-resolution CT 12/19/2018-mild centrilobular nodularity, coronary artery atherosclerosis, aortic atherosclerosis CT chest 4/40/21-improvement in centrilobular nodularity.  High-resolution CT 03/11/2020-no interstitial lung disease, mild peripheral scarring in the periphery of the right lung base, aortic atherosclerosis, three-vessel coronary artery disease, nephrolithiasis.  I have reviewed the images personally.   PFTs 06/28/2017 FVC 1.76  [7%), FEV1 1.58 [78%], F/F 90, TLC 69%, DLCO 54%, DLCO/VA 83%  04/05/2020 FVC 2.02 [80%], FEV1 1.79 [92%], F/F 89, TLC 3.45 [64%], DLCO 14.10 [68%] Moderate restriction and diffusion defect.  FENO 06/28/2017-9  Labs: CCP 01/21/2019-35,  Rheumatoid factor 01/21/19-25 ANA 01/21/2019-negative  CBC 01/21/2019-WBC 7, eos 4.7%, absolute eosinophil count 329 CBC 02/09/2020-WBC 6.6, eos 9.2%, absolute eosinophil count 607 IgE 02/09/2020-365  Assessment:  Asthma CT reviewed with mild centrilobular nodularity of unclear etiology.  Imaging is suggestive of hypersensitivity pneumonitis but she does not have any exposure history and follow-up imaging showed some improvement with no evidence of ILD She has history of rheumatoid arthritis so RA ILD is on the differential but imaging is not suggestive  Suspect she may have reactive airway disease or asthma given elevated peripheral eosinophils and IgE.  PFTs reviewed with reduction in lung volumes secondary to body habitus given reduction in ERV.  She does have mild increase in flow rates suggestive of airway reactivity  Continue Symbicort, add Singulair  Subcentimeter pulmonary nodule Stable and likely benign  Coronary atherosclerosis on CT scan Follows with Dr. Jacinto Halim, cardiology  Plan/Recommendations: - Continue  Symbicort, add Singulair  Chilton Greathouse MD [facility] 04/05/2020, 10:24 AM  CC: [name], MD

## 2020-04-18 DIAGNOSIS — I10 Essential (primary) hypertension: Secondary | ICD-10-CM | POA: Diagnosis not present

## 2020-04-18 DIAGNOSIS — M0589 Other rheumatoid arthritis with rheumatoid factor of multiple sites: Secondary | ICD-10-CM | POA: Diagnosis not present

## 2020-04-18 DIAGNOSIS — E785 Hyperlipidemia, unspecified: Secondary | ICD-10-CM | POA: Diagnosis not present

## 2020-04-27 DIAGNOSIS — M0589 Other rheumatoid arthritis with rheumatoid factor of multiple sites: Secondary | ICD-10-CM | POA: Diagnosis not present

## 2020-05-13 DIAGNOSIS — Z87891 Personal history of nicotine dependence: Secondary | ICD-10-CM | POA: Diagnosis not present

## 2020-05-13 DIAGNOSIS — M130 Polyarthritis, unspecified: Secondary | ICD-10-CM | POA: Diagnosis not present

## 2020-05-13 DIAGNOSIS — I10 Essential (primary) hypertension: Secondary | ICD-10-CM | POA: Diagnosis not present

## 2020-05-13 DIAGNOSIS — J441 Chronic obstructive pulmonary disease with (acute) exacerbation: Secondary | ICD-10-CM | POA: Diagnosis not present

## 2020-05-19 DIAGNOSIS — E782 Mixed hyperlipidemia: Secondary | ICD-10-CM | POA: Diagnosis not present

## 2020-05-19 DIAGNOSIS — E785 Hyperlipidemia, unspecified: Secondary | ICD-10-CM | POA: Diagnosis not present

## 2020-05-19 DIAGNOSIS — M0589 Other rheumatoid arthritis with rheumatoid factor of multiple sites: Secondary | ICD-10-CM | POA: Diagnosis not present

## 2020-05-19 DIAGNOSIS — I2510 Atherosclerotic heart disease of native coronary artery without angina pectoris: Secondary | ICD-10-CM | POA: Diagnosis not present

## 2020-05-19 DIAGNOSIS — I10 Essential (primary) hypertension: Secondary | ICD-10-CM | POA: Diagnosis not present

## 2020-05-20 LAB — LIPID PANEL WITH LDL/HDL RATIO
Cholesterol, Total: 211 mg/dL — ABNORMAL HIGH (ref 100–199)
HDL: 76 mg/dL (ref >39)
LDL Chol Calc (NIH): 120 mg/dL — ABNORMAL HIGH (ref 0–99)
LDL/HDL Ratio: 1.6 ratio (ref 0.0–3.2)
Triglycerides: 86 mg/dL (ref 0–149)
VLDL Cholesterol Cal: 15 mg/dL (ref 5–40)

## 2020-05-20 LAB — LDL CHOLESTEROL, DIRECT: LDL Direct: 106 mg/dL — ABNORMAL HIGH (ref 0–99)

## 2020-05-23 NOTE — H&P (View-Only) (Signed)
Cheyenne Gould Date of Birth: [birth year] MRN: [id number] Primary Care Provider:Bland, Myra Rude, MD Former Cardiology Providers: Jeri Lager, APRN, FNP-C, Dr. Adrian Prows Primary Cardiologist: Rex Kras, DO, [facility] (established care 07/23/2019)  Date: 05/23/2020 Last Office Visit: 11/23/2019  Chief Complaint  Patient presents with  . Shortness of Breath  . Hyperlipidemia  . Follow-up    6 months     HPI  Cheyenne Gould is a 75 y.o.  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  Patient is being followed by our practice for the management of shortness of breath.  Noted to have coronary artery calcification on a nongated CT study and thereafter underwent an echocardiogram and stress test as noted below.  Shared decision was to uptitrate medical therapy and improve her modifable risk factors.   She now presents for 6 month follow up. She states her dyspnea on exertion is relatively stable but over the last week its been getting progressively worse.  She has noticed decrease in physical exertion/endurance which is limited by her effort latest dyspnea.  She denies chest pain at rest or with effort related activities.    I suspect the degree of her dyspnea on exertion is attributed to her underlying her interstitial lung disease; however, balance ischemia also cannot be ruled out.    With regards to the management of hyperlipidemia patient is currently on simvastatin.  Given the coronary artery calcification we discussed up titration of statin therapy.  However due to myalgias and joint pain the shared decision was to  start Nexlizet.  However, Nexlizet was cost prohibitive in the past and patient was started on Zetia. Since then patient had a repeat lipid profile on May 19, 2020 which was independently reviewed.  Total cholesterol remains greater than 200 mg/dL, LDL 106 mg/dL, and HDL 76 mg/dL.  ALLERGIES: Allergies  Allergen Reactions  . Peanut-Containing Drug Products Anaphylaxis  . Shellfish Allergy Anaphylaxis  . Omeprazole Other (See Comments)  . Penicillins Other (See Comments)    Corners of mouth started splitting  . Sulfa Antibiotics Other (See Comments)    unknown  . Allegra [Fexofenadine] Cough  . Leflunomide Rash  . Lyrica [Pregabalin] Other (See Comments)    Causes rheumatoid flares     MEDICATION LIST PRIOR TO VISIT: Current Outpatient Medications on File Prior to Visit  Medication Sig Dispense Refill  . acetaminophen (TYLENOL) 500 MG tablet Take 500 mg by mouth every 6 (six) hours as needed for mild pain or headache.    . albuterol (PROAIR HFA) 108 (90 Base) MCG/ACT inhaler Inhale 2 puffs into the lungs every 6 (six) hours as needed for wheezing or shortness of breath. 1 Inhaler 3  . amLODipine (NORVASC) 5 MG tablet Take 5 mg by mouth daily.    . betamethasone dipropionate 0.05 % cream Apply topically 2 (two) times daily.    . budesonide-formoterol (SYMBICORT) 160-4.5 MCG/ACT inhaler Inhale 2 puffs into the lungs 2 (two) times daily. 10.2 g 5  . Certolizumab Pegol (CIMZIA [place]) Inject into the skin every 30 (thirty) days.    . Cholecalciferol (VITAMIN D3) 2000 units TABS Take by mouth.    . clindamycin (CLEOCIN) 300 MG capsule Take 300 mg by  mouth as needed. Pre dental procedures    . EPIPEN 2-PAK 0.3 MG/0.3ML SOAJ injection Inject 0.3 mg into the muscle as needed (for allergic reactions).     . ezetimibe (ZETIA) 10 MG tablet TAKE 1 TABLET BY MOUTH EVERY DAY 90 tablet 2  . folic acid (FOLVITE) 1 MG tablet 2 TABLET BY MOUTH ONCE DAILY  3  . ibuprofen (ADVIL) 600 MG tablet Take 1  tablet (600 mg total) by mouth every 6 (six) hours as needed. 30 tablet 0  . lansoprazole (PREVACID) 15 MG capsule Take 15 mg by mouth daily at 12 noon.    Marland Kitchen levocetirizine (XYZAL) 5 MG tablet Take 5 mg by mouth at bedtime.     . methotrexate (RHEUMATREX) 2.5 MG tablet 7 tablets once a week  2  . metoprolol succinate (TOPROL-XL) 50 MG 24 hr tablet TAKE 1 TABLET (50 MG TOTAL) BY MOUTH DAILY. TAKE WITH OR IMMEDIATELY FOLLOWING A MEAL. 90 tablet 2  . simvastatin (ZOCOR) 10 MG tablet TAKE 1 TABLET BY MOUTH EVERYDAY AT BEDTIME 90 tablet 1  . tazarotene (AVAGE) 0.1 % cream APPLY TO AFFECTED AREA EVERY DAY AT NIGHT    . valsartan-hydrochlorothiazide (DIOVAN-HCT) 160-12.5 MG tablet Take 1 tablet by mouth daily.     No current facility-administered medications on file prior to visit.    PAST MEDICAL HISTORY: Past Medical History:  Diagnosis Date  . Acid reflux   . AKI (acute kidney injury) ([place]) 06/07/2015  . Allergic sinusitis   . Anemia   . CAP (community acquired pneumonia) 06/07/2015  . Chest pain 01/01/2014  . Coronary artery calcification   . Hyperlipidemia   . Hypertension   . Hypoxia   . Rheumatoid arthritis ([place])    on Leflunomide   . Rheumatoid arthritis(714.0)    on Leflunomide    PAST SURGICAL HISTORY: Past Surgical History:  Procedure Laterality Date  . ABDOMINAL HYSTERECTOMY  1985   Partial  . BUNIONECTOMY  2002,12/18/2006    FAMILY HISTORY: The patient's family history includes Cancer in her brother; Heart disease in her father; Hypertension in her sister; Lupus in her brother.   SOCIAL HISTORY:  The patient  reports that she quit smoking about 17 years ago. Her smoking use included cigarettes. She has a 40.00 pack-year smoking history. She has never used smokeless tobacco. She reports that she does not drink alcohol and does not use drugs.  Review of Systems  Constitutional: Negative for decreased appetite, malaise/fatigue, weight gain and weight loss.  Eyes:  Negative for visual disturbance.  Cardiovascular: Positive for dyspnea on exertion. Negative for chest pain, claudication, leg swelling, orthopnea, palpitations and syncope.  Respiratory: Negative for hemoptysis and wheezing.   Endocrine: Negative for cold intolerance and heat intolerance.  Hematologic/Lymphatic: Does not bruise/bleed easily.  Skin: Negative for nail changes.  Musculoskeletal: Positive for back pain and joint pain. Negative for muscle weakness and myalgias.  Gastrointestinal: Negative for abdominal pain, change in bowel habit, nausea and vomiting.  Neurological: Negative for difficulty with concentration, dizziness, focal weakness and headaches.  Psychiatric/Behavioral: Negative for altered mental status and suicidal ideas.  All other systems reviewed and are negative.   PHYSICAL EXAM: Vitals with BMI 05/24/2020 04/05/2020 02/09/2020  Height 5' 6"  5' 6"  5' 6"   Weight 200 lbs 10 oz 201 lbs 200 lbs 3 oz  BMI 32.39 11.57 26.20  Systolic 355 974 163  Diastolic 52 62 66  Pulse 60 65 72    CONSTITUTIONAL: Well-developed and well-nourished. No acute  distress.  SKIN: Skin is warm and dry. No rash noted. No cyanosis. No pallor. No jaundice HEAD: Normocephalic and atraumatic.  EYES: No scleral icterus MOUTH/THROAT: Moist oral membranes.  NECK: No JVD present. No thyromegaly noted. No carotid bruits  LYMPHATIC: No visible cervical adenopathy.  CHEST Normal respiratory effort. No intercostal retractions  LUNGS: Decreased breath sounds at the bases.  No stridor. No wheezes. No rales.  CARDIOVASCULAR: Regular, positive X1-G6, soft holosystolic murmur heard at the apex.  No gallops or rubs. ABDOMINAL: No apparent ascites.  EXTREMITIES: No peripheral edema.  psoriasis on bilateral palms. HEMATOLOGIC: No significant bruising NEUROLOGIC: Oriented to person, place, and time. Nonfocal. Normal muscle tone.  PSYCHIATRIC: Normal mood and affect. Normal behavior. Cooperative  CARDIAC  DATABASE: CT of the chest  09/25/2017:  coronary atherosclerosis of LAD and left circumflex. Mild atherosclerotic [place] conditions of the aortic arch. Stable 6 mm nodule in right lower lobe likely benign.  EKG: 05/23/2020: Normal sinus rhythm, 76 bpm, nonspecific T wave abnormality, without underlying injury pattern.  Echocardiogram: 05/04/2019: LVEF 26% grade 1 diastolic impairment, normal left atrial pressure, mild AR, mild MR, mild TR, RVSP 29 mmHg, no pulmonary hypertension per echocardiogram.  Stress Testing:  Exercise myoview stress 04/26/2017: Exercised for 4 minutes and 50 seconds, achieved 4.6 METS, 97% of maximum predicted heart rate, low exercise capacity, stress ECG negative for ischemia. Stress and rest SPECT images demonstrate homogeneous tracer distribution throughout the myocardium. Gated SPECT imaging reveals normal myocardial thickening and wall motion.  LVEF 72%.  Low risk study.  48-hour Holter monitor 11/20/2018: Normal sinus rhythm.  Rare PAC.  No reported symptoms.  LABORATORY DATA: CBC Latest Ref Rng & Units 02/09/2020 01/21/2019 06/14/2015  WBC 4.0 - 10.5 K/uL 6.6 7.0 16.3(H)  Hemoglobin 12.0 - 15.0 g/dL 11.2(L) 11.2(L) 8.6(L)  Hematocrit 36.0 - 46.0 % 34.7(L) 33.8(L) 25.1(L)  Platelets 150.0 - 400.0 K/uL 335.0 342.0 487(H)    CMP Latest Ref Rng & Units 01/21/2019 06/14/2015 06/13/2015  Glucose 70 - 99 mg/dL 95 107(H) 97  BUN 6 - 23 mg/dL 15 23(H) 26(H)  Creatinine 0.40 - 1.20 mg/dL 1.13 1.10(H) 1.29(H)  Sodium 135 - 145 mEq/L 137 143 140  Potassium 3.5 - 5.1 mEq/L 3.3(L) 3.8 3.9  Chloride 96 - 112 mEq/L 103 111 109  CO2 19 - 32 mEq/L 25 24 22   Calcium 8.4 - 10.5 mg/dL 9.4 8.8(L) 8.5(L)  Total Protein 6.0 - 8.3 g/dL 6.9 - -  Total Bilirubin 0.2 - 1.2 mg/dL 0.4 - -  Alkaline Phos 39 - 117 U/L 111 - -  AST 0 - 37 U/L 16 - -  ALT 0 - 35 U/L 9 - -    Lipid Panel     Component Value Date/Time   CHOL 211 (H) 05/19/2020 0905   TRIG 86 05/19/2020 0905   HDL 76  05/19/2020 0905   CHOLHDL 2.6 06/23/2019 1108   CHOLHDL 2.7 01/02/2014 0858   VLDL 9 01/02/2014 0858   LDLCALC 120 (H) 05/19/2020 0905   LDLDIRECT 106 (H) 05/19/2020 0905   LABVLDL 15 05/19/2020 0905    Lab Results  Component Value Date   HGBA1C 5.8 (H) 06/08/2015   No components found for: NTPROBNP No results found for: TSH  Cardiac Panel (last 3 results) No results for input(s): CKTOTAL, CKMB, TROPONINIHS, RELINDX in the last 72 hours.  IMPRESSION:    ICD-10-CM   1. Dyspnea on exertion  R06.00 EKG 12-Lead    CMP14+EGFR    CBC  SARS-COV-2 RNA,(COVID-19) QUAL NAAT  2. Coronary artery calcification seen on CT scan  I25.10 Bempedoic Acid-Ezetimibe (NEXLIZET) 180-10 MG TABS    aspirin 81 MG EC tablet  3. Mild hyperlipidemia  E78.5   4. Benign hypertension  I10   5. Class 1 obesity due to excess calories with serious comorbidity and body mass index (BMI) of 32.0 to 32.9 in adult  E66.09    Z68.32   6. Mixed hyperlipidemia  E78.2 Bempedoic Acid-Ezetimibe (NEXLIZET) 180-10 MG TABS     RECOMMENDATIONS: Cheyenne Gould is a 76 y.o. 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  Dyspnea on exertion: It appears that recently her symptoms have been getting progressively worse.  I suspect she has underlying CAD as well as interstitial lung disease are contributing to her symptoms.  However, since her physical endurance has reduced the shared decision was to proceed with left and right heart catheterization to evaluate for obstructive CAD and hemodynamics. Continue valsartan, Toprol-XL, simvastatin, hydrochlorothiazide, Zetia, and amlodipine. Check Covid screen prior to upcoming heart catheterization Check CMP, CBC. The procedure of left heart  catheterization with possible intervention and right heart catheterization was explained to the patient in detail.  The indication, alternatives, risks and benefits were reviewed.  Complications include but not limited to bleeding, infection, vascular injury, stroke, myocardial infection, arrhythmia, kidney injury requiring hemodialysis, need for temporary/permanent pacemaker, radiation-related injury in the case of prolonged fluoroscopy use, emergency cardiac surgery, and death. The patient understands the risks of serious complication is 1-2 in 3614 with diagnostic cardiac cath and 1-2% or less with angioplasty/stenting.  The patient voices understanding and provides verbal feedback and wishes to proceed with coronary angiography with possible PCI.  Coronary artery calcification:  Continue aspirin, simvastatin, Zetia.    We will refill Nexlizet if approved patient is recommended to start next visit and discontinue Zetia.   See above  Mixed hyperlipidemia: Continue simvastatin and Zetia.  Most recent lipid profile independently reviewed.  Obesity, due to excess calories: Body mass index is 32.38 kg/m. . I reviewed with the patient the importance of diet, regular physical activity/exercise, weight loss.   . Patient is educated on increasing physical activity gradually as tolerated.  With the goal of moderate intensity exercise for 30 minutes a day 5 days a week.  FINAL MEDICATION LIST END OF ENCOUNTER: Meds ordered this encounter  Medications  . Bempedoic Acid-Ezetimibe (NEXLIZET) 180-10 MG TABS    Sig: Take 1 tablet by mouth daily.    Dispense:  90 tablet    Refill:  1  . aspirin 81 MG EC tablet    Sig: Take 1 tablet (81 mg total) by mouth daily. Swallow whole.    Dispense:  90 tablet    Refill:  0    Medications Discontinued During This Encounter  Medication Reason  . montelukast (SINGULAIR) 10 MG tablet Error  . triamcinolone ointment (KENALOG) 0.1 % Error  . Bempedoic  Acid-Ezetimibe (NEXLIZET) 180-10 MG TABS Reorder  . aspirin 81 MG EC tablet Reorder     Current Outpatient Medications:  .  acetaminophen (TYLENOL) 500 MG tablet, Take 500 mg by mouth every 6 (six) hours as needed for mild pain or headache., Disp: , Rfl:  .  albuterol (PROAIR HFA) 108 (90 Base) MCG/ACT inhaler, Inhale 2 puffs into the lungs every 6 (six) hours  as needed for wheezing or shortness of breath., Disp: 1 Inhaler, Rfl: 3 .  amLODipine (NORVASC) 5 MG tablet, Take 5 mg by mouth daily., Disp: , Rfl:  .  betamethasone dipropionate 0.05 % cream, Apply topically 2 (two) times daily., Disp: , Rfl:  .  budesonide-formoterol (SYMBICORT) 160-4.5 MCG/ACT inhaler, Inhale 2 puffs into the lungs 2 (two) times daily., Disp: 10.2 g, Rfl: 5 .  Certolizumab Pegol (CIMZIA [place]), Inject into the skin every 30 (thirty) days., Disp: , Rfl:  .  Cholecalciferol (VITAMIN D3) 2000 units TABS, Take by mouth., Disp: , Rfl:  .  clindamycin (CLEOCIN) 300 MG capsule, Take 300 mg by mouth as needed. Pre dental procedures, Disp: , Rfl:  .  EPIPEN 2-PAK 0.3 MG/0.3ML SOAJ injection, Inject 0.3 mg into the muscle as needed (for allergic reactions). , Disp: , Rfl:  .  ezetimibe (ZETIA) 10 MG tablet, TAKE 1 TABLET BY MOUTH EVERY DAY, Disp: 90 tablet, Rfl: 2 .  folic acid (FOLVITE) 1 MG tablet, 2 TABLET BY MOUTH ONCE DAILY, Disp: , Rfl: 3 .  ibuprofen (ADVIL) 600 MG tablet, Take 1 tablet (600 mg total) by mouth every 6 (six) hours as needed., Disp: 30 tablet, Rfl: 0 .  lansoprazole (PREVACID) 15 MG capsule, Take 15 mg by mouth daily at 12 noon., Disp: , Rfl:  .  levocetirizine (XYZAL) 5 MG tablet, Take 5 mg by mouth at bedtime. , Disp: , Rfl:  .  methotrexate (RHEUMATREX) 2.5 MG tablet, 7 tablets once a week, Disp: , Rfl: 2 .  metoprolol succinate (TOPROL-XL) 50 MG 24 hr tablet, TAKE 1 TABLET (50 MG TOTAL) BY MOUTH DAILY. TAKE WITH OR IMMEDIATELY FOLLOWING A MEAL., Disp: 90 tablet, Rfl: 2 .  simvastatin (ZOCOR) 10 MG tablet,  TAKE 1 TABLET BY MOUTH EVERYDAY AT BEDTIME, Disp: 90 tablet, Rfl: 1 .  tazarotene (AVAGE) 0.1 % cream, APPLY TO AFFECTED AREA EVERY DAY AT NIGHT, Disp: , Rfl:  .  valsartan-hydrochlorothiazide (DIOVAN-HCT) 160-12.5 MG tablet, Take 1 tablet by mouth daily., Disp: , Rfl:  .  aspirin 81 MG EC tablet, Take 1 tablet (81 mg total) by mouth daily. Swallow whole., Disp: 90 tablet, Rfl: 0 .  Bempedoic Acid-Ezetimibe (NEXLIZET) 180-10 MG TABS, Take 1 tablet by mouth daily., Disp: 90 tablet, Rfl: 1  Orders Placed This Encounter  Procedures  . SARS-COV-2 RNA,(COVID-19) QUAL NAAT  . CMP14+EGFR  . CBC  . EKG 12-Lead   --Continue cardiac medications as reconciled in final medication list. --Return in about 4 weeks (around 06/21/2020) for Post heart catheterization. Or sooner if needed. --Continue follow-up with your primary care physician regarding the management of your other chronic comorbid conditions.  Patient's questions and concerns were addressed to her satisfaction. She voices understanding of the instructions provided during this encounter.   This note was created using a voice recognition software as a result there may be grammatical errors inadvertently enclosed that do not reflect the nature of this encounter. Every attempt is made to correct such errors.  Total time spent: 33 minutes  Mechele Claude [facility]  Pager: [phone] Office: [phone]

## 2020-05-23 NOTE — Progress Notes (Signed)
Cheyenne Gould Date of Birth: [birth year] MRN: [id number] Primary Care Provider:Bland, Myra Rude, MD Former Cardiology Providers: Jeri Lager, APRN, FNP-C, Dr. Adrian Prows Primary Cardiologist: Rex Kras, DO, [facility] (established care 07/23/2019)  Date: 05/23/2020 Last Office Visit: 11/23/2019  Chief Complaint  Patient presents with  . Shortness of Breath  . Hyperlipidemia  . Follow-up    6 months     HPI  Cheyenne Gould is a 75 y.o.  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  Patient is being followed by our practice for the management of shortness of breath.  Noted to have coronary artery calcification on a nongated CT study and thereafter underwent an echocardiogram and stress test as noted below.  Shared decision was to uptitrate medical therapy and improve her modifable risk factors.   She now presents for 6 month follow up. She states her dyspnea on exertion is relatively stable but over the last week its been getting progressively worse.  She has noticed decrease in physical exertion/endurance which is limited by her effort latest dyspnea.  She denies chest pain at rest or with effort related activities.    I suspect the degree of her dyspnea on exertion is attributed to her underlying her interstitial lung disease; however, balance ischemia also cannot be ruled out.    With regards to the management of hyperlipidemia patient is currently on simvastatin.  Given the coronary artery calcification we discussed up titration of statin therapy.  However due to myalgias and joint pain the shared decision was to  start Nexlizet.  However, Nexlizet was cost prohibitive in the past and patient was started on Zetia. Since then patient had a repeat lipid profile on May 19, 2020 which was independently reviewed.  Total cholesterol remains greater than 200 mg/dL, LDL 106 mg/dL, and HDL 76 mg/dL.  ALLERGIES: Allergies  Allergen Reactions  . Peanut-Containing Drug Products Anaphylaxis  . Shellfish Allergy Anaphylaxis  . Omeprazole Other (See Comments)  . Penicillins Other (See Comments)    Corners of mouth started splitting  . Sulfa Antibiotics Other (See Comments)    unknown  . Allegra [Fexofenadine] Cough  . Leflunomide Rash  . Lyrica [Pregabalin] Other (See Comments)    Causes rheumatoid flares     MEDICATION LIST PRIOR TO VISIT: Current Outpatient Medications on File Prior to Visit  Medication Sig Dispense Refill  . acetaminophen (TYLENOL) 500 MG tablet Take 500 mg by mouth every 6 (six) hours as needed for mild pain or headache.    . albuterol (PROAIR HFA) 108 (90 Base) MCG/ACT inhaler Inhale 2 puffs into the lungs every 6 (six) hours as needed for wheezing or shortness of breath. 1 Inhaler 3  . amLODipine (NORVASC) 5 MG tablet Take 5 mg by mouth daily.    . betamethasone dipropionate 0.05 % cream Apply topically 2 (two) times daily.    . budesonide-formoterol (SYMBICORT) 160-4.5 MCG/ACT inhaler Inhale 2 puffs into the lungs 2 (two) times daily. 10.2 g 5  . Certolizumab Pegol (CIMZIA [place]) Inject into the skin every 30 (thirty) days.    . Cholecalciferol (VITAMIN D3) 2000 units TABS Take by mouth.    . clindamycin (CLEOCIN) 300 MG capsule Take 300 mg by  mouth as needed. Pre dental procedures    . EPIPEN 2-PAK 0.3 MG/0.3ML SOAJ injection Inject 0.3 mg into the muscle as needed (for allergic reactions).     . ezetimibe (ZETIA) 10 MG tablet TAKE 1 TABLET BY MOUTH EVERY DAY 90 tablet 2  . folic acid (FOLVITE) 1 MG tablet 2 TABLET BY MOUTH ONCE DAILY  3  . ibuprofen (ADVIL) 600 MG tablet Take 1  tablet (600 mg total) by mouth every 6 (six) hours as needed. 30 tablet 0  . lansoprazole (PREVACID) 15 MG capsule Take 15 mg by mouth daily at 12 noon.    Marland Kitchen levocetirizine (XYZAL) 5 MG tablet Take 5 mg by mouth at bedtime.     . methotrexate (RHEUMATREX) 2.5 MG tablet 7 tablets once a week  2  . metoprolol succinate (TOPROL-XL) 50 MG 24 hr tablet TAKE 1 TABLET (50 MG TOTAL) BY MOUTH DAILY. TAKE WITH OR IMMEDIATELY FOLLOWING A MEAL. 90 tablet 2  . simvastatin (ZOCOR) 10 MG tablet TAKE 1 TABLET BY MOUTH EVERYDAY AT BEDTIME 90 tablet 1  . tazarotene (AVAGE) 0.1 % cream APPLY TO AFFECTED AREA EVERY DAY AT NIGHT    . valsartan-hydrochlorothiazide (DIOVAN-HCT) 160-12.5 MG tablet Take 1 tablet by mouth daily.     No current facility-administered medications on file prior to visit.    PAST MEDICAL HISTORY: Past Medical History:  Diagnosis Date  . Acid reflux   . AKI (acute kidney injury) ([place]) 06/07/2015  . Allergic sinusitis   . Anemia   . CAP (community acquired pneumonia) 06/07/2015  . Chest pain 01/01/2014  . Coronary artery calcification   . Hyperlipidemia   . Hypertension   . Hypoxia   . Rheumatoid arthritis ([place])    on Leflunomide   . Rheumatoid arthritis(714.0)    on Leflunomide    PAST SURGICAL HISTORY: Past Surgical History:  Procedure Laterality Date  . ABDOMINAL HYSTERECTOMY  1985   Partial  . BUNIONECTOMY  2002,12/18/2006    FAMILY HISTORY: The patient's family history includes Cancer in her brother; Heart disease in her father; Hypertension in her sister; Lupus in her brother.   SOCIAL HISTORY:  The patient  reports that she quit smoking about 17 years ago. Her smoking use included cigarettes. She has a 40.00 pack-year smoking history. She has never used smokeless tobacco. She reports that she does not drink alcohol and does not use drugs.  Review of Systems  Constitutional: Negative for decreased appetite, malaise/fatigue, weight gain and weight loss.  Eyes:  Negative for visual disturbance.  Cardiovascular: Positive for dyspnea on exertion. Negative for chest pain, claudication, leg swelling, orthopnea, palpitations and syncope.  Respiratory: Negative for hemoptysis and wheezing.   Endocrine: Negative for cold intolerance and heat intolerance.  Hematologic/Lymphatic: Does not bruise/bleed easily.  Skin: Negative for nail changes.  Musculoskeletal: Positive for back pain and joint pain. Negative for muscle weakness and myalgias.  Gastrointestinal: Negative for abdominal pain, change in bowel habit, nausea and vomiting.  Neurological: Negative for difficulty with concentration, dizziness, focal weakness and headaches.  Psychiatric/Behavioral: Negative for altered mental status and suicidal ideas.  All other systems reviewed and are negative.   PHYSICAL EXAM: Vitals with BMI 05/24/2020 04/05/2020 02/09/2020  Height 5' 6"  5' 6"  5' 6"   Weight 200 lbs 10 oz 201 lbs 200 lbs 3 oz  BMI 32.39 64.33 29.51  Systolic 884 166 063  Diastolic 52 62 66  Pulse 60 65 72    CONSTITUTIONAL: Well-developed and well-nourished. No acute  distress.  SKIN: Skin is warm and dry. No rash noted. No cyanosis. No pallor. No jaundice HEAD: Normocephalic and atraumatic.  EYES: No scleral icterus MOUTH/THROAT: Moist oral membranes.  NECK: No JVD present. No thyromegaly noted. No carotid bruits  LYMPHATIC: No visible cervical adenopathy.  CHEST Normal respiratory effort. No intercostal retractions  LUNGS: Decreased breath sounds at the bases.  No stridor. No wheezes. No rales.  CARDIOVASCULAR: Regular, positive B6-L8, soft holosystolic murmur heard at the apex.  No gallops or rubs. ABDOMINAL: No apparent ascites.  EXTREMITIES: No peripheral edema.  psoriasis on bilateral palms. HEMATOLOGIC: No significant bruising NEUROLOGIC: Oriented to person, place, and time. Nonfocal. Normal muscle tone.  PSYCHIATRIC: Normal mood and affect. Normal behavior. Cooperative  CARDIAC  DATABASE: CT of the chest  09/25/2017:  coronary atherosclerosis of LAD and left circumflex. Mild atherosclerotic [place] conditions of the aortic arch. Stable 6 mm nodule in right lower lobe likely benign.  EKG: 05/23/2020: Normal sinus rhythm, 76 bpm, nonspecific T wave abnormality, without underlying injury pattern.  Echocardiogram: 05/04/2019: LVEF 93% grade 1 diastolic impairment, normal left atrial pressure, mild AR, mild MR, mild TR, RVSP 29 mmHg, no pulmonary hypertension per echocardiogram.  Stress Testing:  Exercise myoview stress 04/26/2017: Exercised for 4 minutes and 50 seconds, achieved 4.6 METS, 97% of maximum predicted heart rate, low exercise capacity, stress ECG negative for ischemia. Stress and rest SPECT images demonstrate homogeneous tracer distribution throughout the myocardium. Gated SPECT imaging reveals normal myocardial thickening and wall motion.  LVEF 72%.  Low risk study.  48-hour Holter monitor 11/20/2018: Normal sinus rhythm.  Rare PAC.  No reported symptoms.  LABORATORY DATA: CBC Latest Ref Rng & Units 02/09/2020 01/21/2019 06/14/2015  WBC 4.0 - 10.5 K/uL 6.6 7.0 16.3(H)  Hemoglobin 12.0 - 15.0 g/dL 11.2(L) 11.2(L) 8.6(L)  Hematocrit 36.0 - 46.0 % 34.7(L) 33.8(L) 25.1(L)  Platelets 150.0 - 400.0 K/uL 335.0 342.0 487(H)    CMP Latest Ref Rng & Units 01/21/2019 06/14/2015 06/13/2015  Glucose 70 - 99 mg/dL 95 107(H) 97  BUN 6 - 23 mg/dL 15 23(H) 26(H)  Creatinine 0.40 - 1.20 mg/dL 1.13 1.10(H) 1.29(H)  Sodium 135 - 145 mEq/L 137 143 140  Potassium 3.5 - 5.1 mEq/L 3.3(L) 3.8 3.9  Chloride 96 - 112 mEq/L 103 111 109  CO2 19 - 32 mEq/L 25 24 22   Calcium 8.4 - 10.5 mg/dL 9.4 8.8(L) 8.5(L)  Total Protein 6.0 - 8.3 g/dL 6.9 - -  Total Bilirubin 0.2 - 1.2 mg/dL 0.4 - -  Alkaline Phos 39 - 117 U/L 111 - -  AST 0 - 37 U/L 16 - -  ALT 0 - 35 U/L 9 - -    Lipid Panel     Component Value Date/Time   CHOL 211 (H) 05/19/2020 0905   TRIG 86 05/19/2020 0905   HDL 76  05/19/2020 0905   CHOLHDL 2.6 06/23/2019 1108   CHOLHDL 2.7 01/02/2014 0858   VLDL 9 01/02/2014 0858   LDLCALC 120 (H) 05/19/2020 0905   LDLDIRECT 106 (H) 05/19/2020 0905   LABVLDL 15 05/19/2020 0905    Lab Results  Component Value Date   HGBA1C 5.8 (H) 06/08/2015   No components found for: NTPROBNP No results found for: TSH  Cardiac Panel (last 3 results) No results for input(s): CKTOTAL, CKMB, TROPONINIHS, RELINDX in the last 72 hours.  IMPRESSION:    ICD-10-CM   1. Dyspnea on exertion  R06.00 EKG 12-Lead    CMP14+EGFR    CBC  SARS-COV-2 RNA,(COVID-19) QUAL NAAT  2. Coronary artery calcification seen on CT scan  I25.10 Bempedoic Acid-Ezetimibe (NEXLIZET) 180-10 MG TABS    aspirin 81 MG EC tablet  3. Mild hyperlipidemia  E78.5   4. Benign hypertension  I10   5. Class 1 obesity due to excess calories with serious comorbidity and body mass index (BMI) of 32.0 to 32.9 in adult  E66.09    Z68.32   6. Mixed hyperlipidemia  E78.2 Bempedoic Acid-Ezetimibe (NEXLIZET) 180-10 MG TABS     RECOMMENDATIONS: Cheyenne Gould is a 75 y.o. 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  Dyspnea on exertion: It appears that recently her symptoms have been getting progressively worse.  I suspect she has underlying CAD as well as interstitial lung disease are contributing to her symptoms.  However, since her physical endurance has reduced the shared decision was to proceed with left and right heart catheterization to evaluate for obstructive CAD and hemodynamics. Continue valsartan, Toprol-XL, simvastatin, hydrochlorothiazide, Zetia, and amlodipine. Check Covid screen prior to upcoming heart catheterization Check CMP, CBC. The procedure of left heart  catheterization with possible intervention and right heart catheterization was explained to the patient in detail.  The indication, alternatives, risks and benefits were reviewed.  Complications include but not limited to bleeding, infection, vascular injury, stroke, myocardial infection, arrhythmia, kidney injury requiring hemodialysis, need for temporary/permanent pacemaker, radiation-related injury in the case of prolonged fluoroscopy use, emergency cardiac surgery, and death. The patient understands the risks of serious complication is 1-2 in 6568 with diagnostic cardiac cath and 1-2% or less with angioplasty/stenting.  The patient voices understanding and provides verbal feedback and wishes to proceed with coronary angiography with possible PCI.  Coronary artery calcification:  Continue aspirin, simvastatin, Zetia.    We will refill Nexlizet if approved patient is recommended to start next visit and discontinue Zetia.   See above  Mixed hyperlipidemia: Continue simvastatin and Zetia.  Most recent lipid profile independently reviewed.  Obesity, due to excess calories: Body mass index is 32.38 kg/m. . I reviewed with the patient the importance of diet, regular physical activity/exercise, weight loss.   . Patient is educated on increasing physical activity gradually as tolerated.  With the goal of moderate intensity exercise for 30 minutes a day 5 days a week.  FINAL MEDICATION LIST END OF ENCOUNTER: Meds ordered this encounter  Medications  . Bempedoic Acid-Ezetimibe (NEXLIZET) 180-10 MG TABS    Sig: Take 1 tablet by mouth daily.    Dispense:  90 tablet    Refill:  1  . aspirin 81 MG EC tablet    Sig: Take 1 tablet (81 mg total) by mouth daily. Swallow whole.    Dispense:  90 tablet    Refill:  0    Medications Discontinued During This Encounter  Medication Reason  . montelukast (SINGULAIR) 10 MG tablet Error  . triamcinolone ointment (KENALOG) 0.1 % Error  . Bempedoic  Acid-Ezetimibe (NEXLIZET) 180-10 MG TABS Reorder  . aspirin 81 MG EC tablet Reorder     Current Outpatient Medications:  .  acetaminophen (TYLENOL) 500 MG tablet, Take 500 mg by mouth every 6 (six) hours as needed for mild pain or headache., Disp: , Rfl:  .  albuterol (PROAIR HFA) 108 (90 Base) MCG/ACT inhaler, Inhale 2 puffs into the lungs every 6 (six) hours  as needed for wheezing or shortness of breath., Disp: 1 Inhaler, Rfl: 3 .  amLODipine (NORVASC) 5 MG tablet, Take 5 mg by mouth daily., Disp: , Rfl:  .  betamethasone dipropionate 0.05 % cream, Apply topically 2 (two) times daily., Disp: , Rfl:  .  budesonide-formoterol (SYMBICORT) 160-4.5 MCG/ACT inhaler, Inhale 2 puffs into the lungs 2 (two) times daily., Disp: 10.2 g, Rfl: 5 .  Certolizumab Pegol (CIMZIA [place]), Inject into the skin every 30 (thirty) days., Disp: , Rfl:  .  Cholecalciferol (VITAMIN D3) 2000 units TABS, Take by mouth., Disp: , Rfl:  .  clindamycin (CLEOCIN) 300 MG capsule, Take 300 mg by mouth as needed. Pre dental procedures, Disp: , Rfl:  .  EPIPEN 2-PAK 0.3 MG/0.3ML SOAJ injection, Inject 0.3 mg into the muscle as needed (for allergic reactions). , Disp: , Rfl:  .  ezetimibe (ZETIA) 10 MG tablet, TAKE 1 TABLET BY MOUTH EVERY DAY, Disp: 90 tablet, Rfl: 2 .  folic acid (FOLVITE) 1 MG tablet, 2 TABLET BY MOUTH ONCE DAILY, Disp: , Rfl: 3 .  ibuprofen (ADVIL) 600 MG tablet, Take 1 tablet (600 mg total) by mouth every 6 (six) hours as needed., Disp: 30 tablet, Rfl: 0 .  lansoprazole (PREVACID) 15 MG capsule, Take 15 mg by mouth daily at 12 noon., Disp: , Rfl:  .  levocetirizine (XYZAL) 5 MG tablet, Take 5 mg by mouth at bedtime. , Disp: , Rfl:  .  methotrexate (RHEUMATREX) 2.5 MG tablet, 7 tablets once a week, Disp: , Rfl: 2 .  metoprolol succinate (TOPROL-XL) 50 MG 24 hr tablet, TAKE 1 TABLET (50 MG TOTAL) BY MOUTH DAILY. TAKE WITH OR IMMEDIATELY FOLLOWING A MEAL., Disp: 90 tablet, Rfl: 2 .  simvastatin (ZOCOR) 10 MG tablet,  TAKE 1 TABLET BY MOUTH EVERYDAY AT BEDTIME, Disp: 90 tablet, Rfl: 1 .  tazarotene (AVAGE) 0.1 % cream, APPLY TO AFFECTED AREA EVERY DAY AT NIGHT, Disp: , Rfl:  .  valsartan-hydrochlorothiazide (DIOVAN-HCT) 160-12.5 MG tablet, Take 1 tablet by mouth daily., Disp: , Rfl:  .  aspirin 81 MG EC tablet, Take 1 tablet (81 mg total) by mouth daily. Swallow whole., Disp: 90 tablet, Rfl: 0 .  Bempedoic Acid-Ezetimibe (NEXLIZET) 180-10 MG TABS, Take 1 tablet by mouth daily., Disp: 90 tablet, Rfl: 1  Orders Placed This Encounter  Procedures  . SARS-COV-2 RNA,(COVID-19) QUAL NAAT  . CMP14+EGFR  . CBC  . EKG 12-Lead   --Continue cardiac medications as reconciled in final medication list. --Return in about 4 weeks (around 06/21/2020) for Post heart catheterization. Or sooner if needed. --Continue follow-up with your primary care physician regarding the management of your other chronic comorbid conditions.  Patient's questions and concerns were addressed to her satisfaction. She voices understanding of the instructions provided during this encounter.   This note was created using a voice recognition software as a result there may be grammatical errors inadvertently enclosed that do not reflect the nature of this encounter. Every attempt is made to correct such errors.  Total time spent: 33 minutes  Mechele Claude [facility]  Pager: [phone] Office: [phone]

## 2020-05-24 ENCOUNTER — Ambulatory Visit: Payer: Federal, State, Local not specified - PPO | Admitting: Cardiology

## 2020-05-24 ENCOUNTER — Encounter: Payer: Self-pay | Admitting: Cardiology

## 2020-05-24 ENCOUNTER — Other Ambulatory Visit: Payer: Self-pay

## 2020-05-24 VITALS — BP 116/52 | HR 60 | Temp 97.6°F | Resp 16 | Ht 66.0 in | Wt 200.6 lb

## 2020-05-24 DIAGNOSIS — R0609 Other forms of dyspnea: Secondary | ICD-10-CM | POA: Diagnosis not present

## 2020-05-24 DIAGNOSIS — R0600 Dyspnea, unspecified: Secondary | ICD-10-CM

## 2020-05-24 DIAGNOSIS — I2510 Atherosclerotic heart disease of native coronary artery without angina pectoris: Secondary | ICD-10-CM | POA: Diagnosis not present

## 2020-05-24 DIAGNOSIS — E785 Hyperlipidemia, unspecified: Secondary | ICD-10-CM | POA: Diagnosis not present

## 2020-05-24 DIAGNOSIS — I10 Essential (primary) hypertension: Secondary | ICD-10-CM | POA: Diagnosis not present

## 2020-05-24 DIAGNOSIS — Z6832 Body mass index (BMI) 32.0-32.9, adult: Secondary | ICD-10-CM | POA: Diagnosis not present

## 2020-05-24 DIAGNOSIS — E782 Mixed hyperlipidemia: Secondary | ICD-10-CM

## 2020-05-24 DIAGNOSIS — E6609 Other obesity due to excess calories: Secondary | ICD-10-CM

## 2020-05-24 MED ORDER — NEXLIZET 180-10 MG PO TABS: 1.0000 | ORAL_TABLET | Freq: Every day | ORAL | 1 refills | Status: DC

## 2020-05-24 MED ORDER — ASPIRIN 81 MG PO TBEC: 81.0000 mg | DELAYED_RELEASE_TABLET | Freq: Every day | ORAL | 0 refills | Status: DC

## 2020-05-25 DIAGNOSIS — M0589 Other rheumatoid arthritis with rheumatoid factor of multiple sites: Secondary | ICD-10-CM | POA: Diagnosis not present

## 2020-05-25 DIAGNOSIS — Z79899 Other long term (current) drug therapy: Secondary | ICD-10-CM | POA: Diagnosis not present

## 2020-05-31 ENCOUNTER — Other Ambulatory Visit: Payer: Self-pay

## 2020-05-31 DIAGNOSIS — E782 Mixed hyperlipidemia: Secondary | ICD-10-CM

## 2020-05-31 DIAGNOSIS — I2510 Atherosclerotic heart disease of native coronary artery without angina pectoris: Secondary | ICD-10-CM

## 2020-05-31 MED ORDER — NEXLIZET 180-10 MG PO TABS: 1.0000 | ORAL_TABLET | Freq: Every day | ORAL | 1 refills | Status: DC

## 2020-06-10 ENCOUNTER — Other Ambulatory Visit (HOSPITAL_COMMUNITY)
Admission: RE | Admit: 2020-06-10 | Discharge: 2020-06-10 | Disposition: A | Discharge status: Home / Self Care | Payer: Medicare Other | Source: Ambulatory Visit | Attending: Cardiology | Admitting: Cardiology

## 2020-06-10 DIAGNOSIS — Z20822 Contact with and (suspected) exposure to covid-19: Secondary | ICD-10-CM | POA: Insufficient documentation

## 2020-06-10 DIAGNOSIS — Z01812 Encounter for preprocedural laboratory examination: Secondary | ICD-10-CM | POA: Diagnosis not present

## 2020-06-11 LAB — SARS CORONAVIRUS 2 (TAT 6-24 HRS): SARS Coronavirus 2: NEGATIVE

## 2020-06-13 DIAGNOSIS — R0600 Dyspnea, unspecified: Secondary | ICD-10-CM | POA: Diagnosis not present

## 2020-06-14 ENCOUNTER — Other Ambulatory Visit: Payer: Self-pay

## 2020-06-14 ENCOUNTER — Ambulatory Visit (HOSPITAL_COMMUNITY)
Admission: RE | Admit: 2020-06-14 | Discharge: 2020-06-14 | Disposition: A | Discharge status: Home / Self Care | Payer: Medicare Other | Attending: Cardiology | Admitting: Cardiology

## 2020-06-14 ENCOUNTER — Ambulatory Visit (HOSPITAL_COMMUNITY)
Admission: RE | Disposition: A | Discharge status: Home / Self Care | Payer: Self-pay | Source: Home / Self Care | Attending: Cardiology

## 2020-06-14 DIAGNOSIS — Z79899 Other long term (current) drug therapy: Secondary | ICD-10-CM | POA: Insufficient documentation

## 2020-06-14 DIAGNOSIS — Z888 Allergy status to other drugs, medicaments and biological substances status: Secondary | ICD-10-CM | POA: Diagnosis not present

## 2020-06-14 DIAGNOSIS — Z6832 Body mass index (BMI) 32.0-32.9, adult: Secondary | ICD-10-CM | POA: Insufficient documentation

## 2020-06-14 DIAGNOSIS — R0609 Other forms of dyspnea: Secondary | ICD-10-CM | POA: Diagnosis present

## 2020-06-14 DIAGNOSIS — R0600 Dyspnea, unspecified: Secondary | ICD-10-CM | POA: Insufficient documentation

## 2020-06-14 DIAGNOSIS — E6609 Other obesity due to excess calories: Secondary | ICD-10-CM | POA: Diagnosis not present

## 2020-06-14 DIAGNOSIS — Z87891 Personal history of nicotine dependence: Secondary | ICD-10-CM | POA: Diagnosis not present

## 2020-06-14 DIAGNOSIS — Z882 Allergy status to sulfonamides status: Secondary | ICD-10-CM | POA: Insufficient documentation

## 2020-06-14 DIAGNOSIS — Z91013 Allergy to seafood: Secondary | ICD-10-CM | POA: Insufficient documentation

## 2020-06-14 DIAGNOSIS — I10 Essential (primary) hypertension: Secondary | ICD-10-CM | POA: Insufficient documentation

## 2020-06-14 DIAGNOSIS — Z7951 Long term (current) use of inhaled steroids: Secondary | ICD-10-CM | POA: Diagnosis not present

## 2020-06-14 DIAGNOSIS — I2510 Atherosclerotic heart disease of native coronary artery without angina pectoris: Secondary | ICD-10-CM | POA: Diagnosis not present

## 2020-06-14 DIAGNOSIS — Z7982 Long term (current) use of aspirin: Secondary | ICD-10-CM | POA: Diagnosis not present

## 2020-06-14 DIAGNOSIS — E782 Mixed hyperlipidemia: Secondary | ICD-10-CM | POA: Insufficient documentation

## 2020-06-14 DIAGNOSIS — Z880 Allergy status to penicillin: Secondary | ICD-10-CM | POA: Insufficient documentation

## 2020-06-14 HISTORY — PX: RIGHT/LEFT HEART CATH AND CORONARY ANGIOGRAPHY: CATH118266

## 2020-06-14 LAB — CBC
Hematocrit: 33.5 % — ABNORMAL LOW (ref 34.0–46.6)
Hemoglobin: 11 g/dL — ABNORMAL LOW (ref 11.1–15.9)
MCH: 29.8 pg (ref 26.6–33.0)
MCHC: 32.8 g/dL (ref 31.5–35.7)
MCV: 91 fL (ref 79–97)
Platelets: 350 10*3/uL (ref 150–450)
RBC: 3.69 x10E6/uL — ABNORMAL LOW (ref 3.77–5.28)
RDW: 14.1 % (ref 11.7–15.4)
WBC: 6.1 10*3/uL (ref 3.4–10.8)

## 2020-06-14 LAB — POCT I-STAT EG7
Acid-Base Excess: 0 mmol/L (ref 0.0–2.0)
Acid-Base Excess: 0 mmol/L (ref 0.0–2.0)
Acid-base deficit: 1 mmol/L (ref 0.0–2.0)
Bicarbonate: 25.7 mmol/L (ref 20.0–28.0)
Bicarbonate: 24.8 mmol/L (ref 20.0–28.0)
Bicarbonate: 25.6 mmol/L (ref 20.0–28.0)
Calcium, Ion: 1.27 mmol/L (ref 1.15–1.40)
Calcium, Ion: 1.28 mmol/L (ref 1.15–1.40)
Calcium, Ion: 1.29 mmol/L (ref 1.15–1.40)
HCT: 32 % — ABNORMAL LOW (ref 36.0–46.0)
HCT: 32 % — ABNORMAL LOW (ref 36.0–46.0)
HCT: 32 % — ABNORMAL LOW (ref 36.0–46.0)
Hemoglobin: 10.9 g/dL — ABNORMAL LOW (ref 12.0–15.0)
Hemoglobin: 10.9 g/dL — ABNORMAL LOW (ref 12.0–15.0)
Hemoglobin: 10.9 g/dL — ABNORMAL LOW (ref 12.0–15.0)
O2 Saturation: 79 %
O2 Saturation: 76 %
O2 Saturation: 77 %
Potassium: 4.1 mmol/L (ref 3.5–5.1)
Potassium: 4.1 mmol/L (ref 3.5–5.1)
Potassium: 4.2 mmol/L (ref 3.5–5.1)
Sodium: 141 mmol/L (ref 135–145)
Sodium: 141 mmol/L (ref 135–145)
Sodium: 141 mmol/L (ref 135–145)
TCO2: 27 mmol/L (ref 22–32)
TCO2: 26 mmol/L (ref 22–32)
TCO2: 27 mmol/L (ref 22–32)
pCO2, Ven: 47.2 mmHg (ref 44.0–60.0)
pCO2, Ven: 45.5 mmHg (ref 44.0–60.0)
pCO2, Ven: 47.1 mmHg (ref 44.0–60.0)
pH, Ven: 7.344 (ref 7.250–7.430)
pH, Ven: 7.344 (ref 7.250–7.430)
pH, Ven: 7.344 (ref 7.250–7.430)
pO2, Ven: 47 mmHg — ABNORMAL HIGH (ref 32.0–45.0)
pO2, Ven: 44 mmHg (ref 32.0–45.0)
pO2, Ven: 44 mmHg (ref 32.0–45.0)

## 2020-06-14 LAB — CMP14+EGFR
ALT: 6 IU/L (ref 0–32)
AST: 16 IU/L (ref 0–40)
Albumin/Globulin Ratio: 1.6 (ref 1.2–2.2)
Albumin: 4 g/dL (ref 3.7–4.7)
Alkaline Phosphatase: 106 IU/L (ref 44–121)
BUN/Creatinine Ratio: 15 (ref 12–28)
BUN: 23 mg/dL (ref 8–27)
Bilirubin Total: 0.3 mg/dL (ref 0.0–1.2)
CO2: 21 mmol/L (ref 20–29)
Calcium: 9.6 mg/dL (ref 8.7–10.3)
Chloride: 103 mmol/L (ref 96–106)
Creatinine, Ser: 1.55 mg/dL — ABNORMAL HIGH (ref 0.57–1.00)
Globulin, Total: 2.5 g/dL (ref 1.5–4.5)
Glucose: 110 mg/dL — ABNORMAL HIGH (ref 65–99)
Potassium: 4.3 mmol/L (ref 3.5–5.2)
Sodium: 140 mmol/L (ref 134–144)
Total Protein: 6.5 g/dL (ref 6.0–8.5)
eGFR: 35 mL/min/{1.73_m2} — ABNORMAL LOW (ref >59)

## 2020-06-14 LAB — POCT I-STAT 7, (LYTES, BLD GAS, ICA,H+H)
Acid-Base Excess: 0 mmol/L (ref 0.0–2.0)
Bicarbonate: 25.5 mmol/L (ref 20.0–28.0)
Calcium, Ion: 1.28 mmol/L (ref 1.15–1.40)
HCT: 31 % — ABNORMAL LOW (ref 36.0–46.0)
Hemoglobin: 10.5 g/dL — ABNORMAL LOW (ref 12.0–15.0)
O2 Saturation: 100 %
Potassium: 4.1 mmol/L (ref 3.5–5.1)
Sodium: 140 mmol/L (ref 135–145)
TCO2: 27 mmol/L (ref 22–32)
pCO2 arterial: 45.6 mmHg (ref 32.0–48.0)
pH, Arterial: 7.356 (ref 7.350–7.450)
pO2, Arterial: 188 mmHg — ABNORMAL HIGH (ref 83.0–108.0)

## 2020-06-14 SURGERY — RIGHT/LEFT HEART CATH AND CORONARY ANGIOGRAPHY
Anesthesia: LOCAL

## 2020-06-14 MED ORDER — SODIUM CHLORIDE 0.9 % IV SOLN: 250.0000 mL | INTRAVENOUS | Status: DC | PRN

## 2020-06-14 MED ORDER — HEPARIN SODIUM (PORCINE) 1000 UNIT/ML IJ SOLN
INTRAMUSCULAR | Status: DC | PRN
Start: 2020-06-14 — End: 2020-06-14
  Administered 2020-06-14: 4500 [IU] via INTRAVENOUS

## 2020-06-14 MED ORDER — SODIUM CHLORIDE 0.9% FLUSH: 3.0000 mL | Freq: Two times a day (BID) | INTRAVENOUS | Status: DC

## 2020-06-14 MED ORDER — VERAPAMIL HCL 2.5 MG/ML IV SOLN
INTRAVENOUS | Status: AC
  Filled 2020-06-14: qty 2

## 2020-06-14 MED ORDER — HEPARIN (PORCINE) IN NACL 1000-0.9 UT/500ML-% IV SOLN
INTRAVENOUS | Status: AC
  Filled 2020-06-14: qty 1500

## 2020-06-14 MED ORDER — SODIUM CHLORIDE 0.9 % WEIGHT BASED INFUSION
3.0000 mL/kg/h | INTRAVENOUS | Status: AC
  Administered 2020-06-14: 3 mL/kg/h via INTRAVENOUS

## 2020-06-14 MED ORDER — SODIUM CHLORIDE 0.9 % WEIGHT BASED INFUSION: 1.0000 mL/kg/h | INTRAVENOUS | Status: DC

## 2020-06-14 MED ORDER — SODIUM CHLORIDE 0.9% FLUSH: 3.0000 mL | INTRAVENOUS | Status: DC | PRN

## 2020-06-14 MED ORDER — ONDANSETRON HCL 4 MG/2ML IJ SOLN: 4.0000 mg | Freq: Four times a day (QID) | INTRAMUSCULAR | Status: DC | PRN

## 2020-06-14 MED ORDER — ACETAMINOPHEN 325 MG PO TABS: 650.0000 mg | ORAL_TABLET | ORAL | Status: DC | PRN

## 2020-06-14 MED ORDER — SODIUM CHLORIDE 0.9 % IV SOLN: INTRAVENOUS | Status: DC

## 2020-06-14 MED ORDER — MIDAZOLAM HCL 2 MG/2ML IJ SOLN
INTRAMUSCULAR | Status: DC | PRN
  Administered 2020-06-14: 1 mg via INTRAVENOUS

## 2020-06-14 MED ORDER — MIDAZOLAM HCL 2 MG/2ML IJ SOLN
INTRAMUSCULAR | Status: AC
  Filled 2020-06-14: qty 2

## 2020-06-14 MED ORDER — ASPIRIN 81 MG PO CHEW: 81.0000 mg | CHEWABLE_TABLET | ORAL | Status: DC

## 2020-06-14 MED ORDER — LIDOCAINE HCL (PF) 1 % IJ SOLN
INTRAMUSCULAR | Status: DC | PRN
  Administered 2020-06-14: 2 mL

## 2020-06-14 MED ORDER — VERAPAMIL HCL 2.5 MG/ML IV SOLN: INTRAVENOUS | Status: DC | PRN

## 2020-06-14 MED ORDER — LIDOCAINE HCL (PF) 1 % IJ SOLN
INTRAMUSCULAR | Status: AC
  Filled 2020-06-14: qty 30

## 2020-06-14 MED ORDER — HEPARIN SODIUM (PORCINE) 1000 UNIT/ML IJ SOLN
INTRAMUSCULAR | Status: AC
  Filled 2020-06-14: qty 1

## 2020-06-14 MED ORDER — IOHEXOL 350 MG/ML SOLN
INTRAVENOUS | Status: DC | PRN
  Administered 2020-06-14: 30 mL

## 2020-06-14 MED ORDER — HEPARIN (PORCINE) IN NACL 1000-0.9 UT/500ML-% IV SOLN
INTRAVENOUS | Status: DC | PRN
  Administered 2020-06-14 (×2): 500 mL

## 2020-06-14 MED ORDER — FENTANYL CITRATE (PF) 100 MCG/2ML IJ SOLN
INTRAMUSCULAR | Status: AC
  Filled 2020-06-14: qty 2

## 2020-06-14 SURGICAL SUPPLY — 11 items
CATH OPTITORQUE TIG 4.0 5F (CATHETERS) ×1 IMPLANT
CATH SWAN GANZ 7F STRAIGHT (CATHETERS) ×2 IMPLANT
DEVICE RAD COMP TR BAND LRG (VASCULAR PRODUCTS) ×1 IMPLANT
GLIDESHEATH SLEND A-KIT 6F 22G (SHEATH) ×1 IMPLANT
GLIDESHEATH SLENDER 7FR .021G (SHEATH) ×2 IMPLANT
GUIDEWIRE INQWIRE 1.5J.035X260 (WIRE) IMPLANT
INQWIRE 1.5J .035X260CM (WIRE) ×6
KIT HEART LEFT (KITS) ×2 IMPLANT
PACK CARDIAC CATHETERIZATION (CUSTOM PROCEDURE TRAY) ×2 IMPLANT
TRANSDUCER W/STOPCOCK (MISCELLANEOUS) ×2 IMPLANT
TUBING CIL FLEX 10 FLL-RA (TUBING) ×2 IMPLANT

## 2020-06-14 NOTE — Interval H&P Note (Signed)
History and Physical Interval Note:  06/14/2020 11:15 AM  Cheyenne Gould  has presented today for surgery, with the diagnosis of doe - cad.  The various methods of treatment have been discussed with the patient and family. After consideration of risks, benefits and other options for treatment, the patient has consented to  Procedure(s): RIGHT/LEFT HEART CATH AND CORONARY ANGIOGRAPHY (N/A) and possible angioplasty as a surgical intervention.  The patient's history has been reviewed, patient examined, no change in status, stable for surgery.  I have reviewed the patient's chart and labs.  Questions were answered to the patient's satisfaction.    Cath Lab Visit (complete for each Cath Lab visit)  Clinical Evaluation Leading to the Procedure:   ACS: No.  Non-ACS:    Anginal Classification: CCS III  Anti-ischemic medical therapy: Minimal Therapy (1 class of medications)  Non-Invasive Test Results: No non-invasive testing performed  Prior CABG: No previous CABG   Yates Decamp

## 2020-06-14 NOTE — Discharge Instructions (Signed)
DRINK PLENTY OF FLUIDS OVER THE NEXT 2-3 DAYS.  Radial Site Care  This sheet gives you information about how to care for yourself after your procedure. Your health care provider may also give you more specific instructions. If you have problems or questions, contact your health care provider. What can I expect after the procedure? After the procedure, it is common to have:  Bruising and tenderness at the catheter insertion area. Follow these instructions at home: Medicines  Take over-the-counter and prescription medicines only as told by your health care provider. Insertion site care  Follow instructions from your health care provider about how to take care of your insertion site. Make sure you: ? Wash your hands with soap and water before you change your bandage (dressing). If soap and water are not available, use hand sanitizer. ? Change your dressing as told by your health care provider. ? Leave stitches (sutures), skin glue, or adhesive strips in place. These skin closures may need to stay in place for 2 weeks or longer. If adhesive strip edges start to loosen and curl up, you may trim the loose edges. Do not remove adhesive strips completely unless your health care provider tells you to do that.  Check your insertion site every day for signs of infection. Check for: ? Redness, swelling, or pain. ? Fluid or blood. ? Pus or a bad smell. ? Warmth.  Do not take baths, swim, or use a hot tub until your health care provider approves.  You may shower 24-48 hours after the procedure, or as directed by your health care provider. ? Remove the dressing and gently wash the site with plain soap and water. ? Pat the area dry with a clean towel. ? Do not rub the site. That could cause bleeding.  Do not apply powder or lotion to the site. Activity  For 24 hours after the procedure, or as directed by your health care provider: ? Do not flex or bend the affected arm. ? Do not push or pull  heavy objects with the affected arm. ? Do not drive yourself home from the hospital or clinic. You may drive 24 hours after the procedure unless your health care provider tells you not to. ? Do not operate machinery or power tools.  Do not lift anything that is heavier than 10 lb (4.5 kg), or the limit that you are told, until your health care provider says that it is safe.  Ask your health care provider when it is okay to: ? Return to work or school. ? Resume usual physical activities or sports. ? Resume sexual activity.   General instructions  If the catheter site starts to bleed, raise your arm and put firm pressure on the site. If the bleeding does not stop, get help right away. This is a medical emergency.  If you went home on the same day as your procedure, a responsible adult should be with you for the first 24 hours after you arrive home.  Keep all follow-up visits as told by your health care provider. This is important. Contact a health care provider if:  You have a fever.  You have redness, swelling, or yellow drainage around your insertion site. Get help right away if:  You have unusual pain at the radial site.  The catheter insertion area swells very fast.  The insertion area is bleeding, and the bleeding does not stop when you hold steady pressure on the area.  Your arm or hand becomes pale,   cool, tingly, or numb. These symptoms may represent a serious problem that is an emergency. Do not wait to see if the symptoms will go away. Get medical help right away. Call your local emergency services (911 in the [place] Do not drive yourself to the hospital. Summary  After the procedure, it is common to have bruising and tenderness at the site.  Follow instructions from your health care provider about how to take care of your radial site wound. Check the wound every day for signs of infection.  Do not lift anything that is heavier than 10 lb (4.5 kg), or the limit that you  are told, until your health care provider says that it is safe. This information is not intended to replace advice given to you by your health care provider. Make sure you discuss any questions you have with your health care provider. Document Revised: 03/13/2017 Document Reviewed: 03/13/2017 Elsevier Patient Education  2021 [employer]  

## 2020-06-15 ENCOUNTER — Encounter (HOSPITAL_COMMUNITY): Payer: Self-pay | Admitting: Cardiology

## 2020-06-15 ENCOUNTER — Other Ambulatory Visit: Payer: Self-pay

## 2020-06-15 DIAGNOSIS — I10 Essential (primary) hypertension: Secondary | ICD-10-CM

## 2020-06-15 DIAGNOSIS — I2510 Atherosclerotic heart disease of native coronary artery without angina pectoris: Secondary | ICD-10-CM

## 2020-06-15 MED FILL — Heparin Sod (Porcine)-NaCl IV Soln 1000 Unit/500ML-0.9%: INTRAVENOUS | Qty: 500 | Status: AC

## 2020-06-15 MED FILL — Fentanyl Citrate Preservative Free (PF) Inj 100 MCG/2ML: INTRAMUSCULAR | Qty: 2 | Status: AC

## 2020-06-15 NOTE — Progress Notes (Signed)
Spoke to patient she is aware to stop rx and increase her water intake and to go get labs

## 2020-06-19 ENCOUNTER — Other Ambulatory Visit: Payer: Self-pay | Admitting: Cardiology

## 2020-06-19 DIAGNOSIS — I2510 Atherosclerotic heart disease of native coronary artery without angina pectoris: Secondary | ICD-10-CM

## 2020-06-19 DIAGNOSIS — I10 Essential (primary) hypertension: Secondary | ICD-10-CM

## 2020-06-20 NOTE — Progress Notes (Signed)
Called pt to remind her about lab work. Pt understood

## 2020-06-22 DIAGNOSIS — I10 Essential (primary) hypertension: Secondary | ICD-10-CM | POA: Diagnosis not present

## 2020-06-22 DIAGNOSIS — M0589 Other rheumatoid arthritis with rheumatoid factor of multiple sites: Secondary | ICD-10-CM | POA: Diagnosis not present

## 2020-06-22 DIAGNOSIS — I2510 Atherosclerotic heart disease of native coronary artery without angina pectoris: Secondary | ICD-10-CM | POA: Diagnosis not present

## 2020-06-23 ENCOUNTER — Other Ambulatory Visit: Payer: Self-pay | Admitting: Cardiology

## 2020-06-23 LAB — BASIC METABOLIC PANEL
BUN/Creatinine Ratio: 13 (ref 12–28)
BUN: 19 mg/dL (ref 8–27)
CO2: 24 mmol/L (ref 20–29)
Calcium: 9.5 mg/dL (ref 8.7–10.3)
Chloride: 101 mmol/L (ref 96–106)
Creatinine, Ser: 1.46 mg/dL — ABNORMAL HIGH (ref 0.57–1.00)
Glucose: 71 mg/dL (ref 65–99)
Potassium: 5 mmol/L (ref 3.5–5.2)
Sodium: 138 mmol/L (ref 134–144)
eGFR: 38 mL/min/{1.73_m2} — ABNORMAL LOW (ref >59)

## 2020-06-27 ENCOUNTER — Other Ambulatory Visit: Payer: Self-pay

## 2020-06-27 DIAGNOSIS — I2510 Atherosclerotic heart disease of native coronary artery without angina pectoris: Secondary | ICD-10-CM

## 2020-06-27 DIAGNOSIS — I10 Essential (primary) hypertension: Secondary | ICD-10-CM

## 2020-06-27 NOTE — Progress Notes (Signed)
Spoke to patient she is aware to go get labs

## 2020-06-28 DIAGNOSIS — Z6832 Body mass index (BMI) 32.0-32.9, adult: Secondary | ICD-10-CM | POA: Diagnosis not present

## 2020-06-28 DIAGNOSIS — L401 Generalized pustular psoriasis: Secondary | ICD-10-CM | POA: Diagnosis not present

## 2020-06-28 DIAGNOSIS — E669 Obesity, unspecified: Secondary | ICD-10-CM | POA: Diagnosis not present

## 2020-06-28 DIAGNOSIS — M25512 Pain in left shoulder: Secondary | ICD-10-CM | POA: Diagnosis not present

## 2020-06-28 DIAGNOSIS — R0602 Shortness of breath: Secondary | ICD-10-CM | POA: Diagnosis not present

## 2020-06-28 DIAGNOSIS — M150 Primary generalized (osteo)arthritis: Secondary | ICD-10-CM | POA: Diagnosis not present

## 2020-06-28 DIAGNOSIS — M0589 Other rheumatoid arthritis with rheumatoid factor of multiple sites: Secondary | ICD-10-CM | POA: Diagnosis not present

## 2020-06-28 DIAGNOSIS — L4059 Other psoriatic arthropathy: Secondary | ICD-10-CM | POA: Diagnosis not present

## 2020-06-28 DIAGNOSIS — M5136 Other intervertebral disc degeneration, lumbar region: Secondary | ICD-10-CM | POA: Diagnosis not present

## 2020-06-29 DIAGNOSIS — Z79899 Other long term (current) drug therapy: Secondary | ICD-10-CM | POA: Diagnosis not present

## 2020-06-29 DIAGNOSIS — L403 Pustulosis palmaris et plantaris: Secondary | ICD-10-CM | POA: Diagnosis not present

## 2020-06-29 DIAGNOSIS — L400 Psoriasis vulgaris: Secondary | ICD-10-CM | POA: Diagnosis not present

## 2020-07-05 ENCOUNTER — Other Ambulatory Visit: Payer: Self-pay | Admitting: Cardiology

## 2020-07-11 DIAGNOSIS — I10 Essential (primary) hypertension: Secondary | ICD-10-CM | POA: Diagnosis not present

## 2020-07-11 DIAGNOSIS — M0540 Rheumatoid myopathy with rheumatoid arthritis of unspecified site: Secondary | ICD-10-CM | POA: Diagnosis not present

## 2020-07-12 DIAGNOSIS — I10 Essential (primary) hypertension: Secondary | ICD-10-CM | POA: Diagnosis not present

## 2020-07-12 DIAGNOSIS — I2510 Atherosclerotic heart disease of native coronary artery without angina pectoris: Secondary | ICD-10-CM | POA: Diagnosis not present

## 2020-07-13 LAB — BASIC METABOLIC PANEL
BUN/Creatinine Ratio: 13 (ref 12–28)
BUN: 20 mg/dL (ref 8–27)
CO2: 23 mmol/L (ref 20–29)
Calcium: 10.1 mg/dL (ref 8.7–10.3)
Chloride: 103 mmol/L (ref 96–106)
Creatinine, Ser: 1.54 mg/dL — ABNORMAL HIGH (ref 0.57–1.00)
Glucose: 102 mg/dL — ABNORMAL HIGH (ref 65–99)
Potassium: 4.3 mmol/L (ref 3.5–5.2)
Sodium: 140 mmol/L (ref 134–144)
eGFR: 35 mL/min/{1.73_m2} — ABNORMAL LOW (ref >59)

## 2020-07-15 ENCOUNTER — Ambulatory Visit: Payer: Federal, State, Local not specified - PPO | Admitting: Cardiology

## 2020-07-15 ENCOUNTER — Other Ambulatory Visit: Payer: Self-pay

## 2020-07-15 ENCOUNTER — Encounter: Payer: Self-pay | Admitting: Cardiology

## 2020-07-15 VITALS — BP 109/65 | HR 70 | Resp 16 | Ht 66.0 in | Wt 200.0 lb

## 2020-07-15 DIAGNOSIS — Z6832 Body mass index (BMI) 32.0-32.9, adult: Secondary | ICD-10-CM | POA: Diagnosis not present

## 2020-07-15 DIAGNOSIS — I2510 Atherosclerotic heart disease of native coronary artery without angina pectoris: Secondary | ICD-10-CM

## 2020-07-15 DIAGNOSIS — E785 Hyperlipidemia, unspecified: Secondary | ICD-10-CM

## 2020-07-15 DIAGNOSIS — R0600 Dyspnea, unspecified: Secondary | ICD-10-CM

## 2020-07-15 DIAGNOSIS — E782 Mixed hyperlipidemia: Secondary | ICD-10-CM

## 2020-07-15 DIAGNOSIS — I10 Essential (primary) hypertension: Secondary | ICD-10-CM | POA: Diagnosis not present

## 2020-07-15 DIAGNOSIS — E6609 Other obesity due to excess calories: Secondary | ICD-10-CM

## 2020-07-15 DIAGNOSIS — R0609 Other forms of dyspnea: Secondary | ICD-10-CM | POA: Diagnosis not present

## 2020-07-15 MED ORDER — HYDROCHLOROTHIAZIDE 12.5 MG PO CAPS: 12.5000 mg | ORAL_CAPSULE | Freq: Every morning | ORAL | 0 refills | Status: DC

## 2020-07-15 MED ORDER — VALSARTAN 40 MG PO TABS: 40.0000 mg | ORAL_TABLET | Freq: Every evening | ORAL | 0 refills | Status: DC

## 2020-07-15 NOTE — Progress Notes (Signed)
[name] Date of Birth: 07/25/1945 MRN: [id number] Primary Care Provider:Bland, Adrian Saran, MD Former Cardiology Providers: [place], APRN, FNP-C, Dr. Yates Decamp Primary Cardiologist: Tessa Lerner, DO, [facility] (established care 07/23/2019)  Date: 07/15/20 Last Office Visit: 05/24/2020  Chief Complaint  Patient presents with  . Post heart catheterization  . Follow-up    HPI  Cheyenne Gould is a 75 y.o.  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  Patient is being followed by our practice for the management of shortness of breath.  Noted to have coronary artery calcification on a nongated CT study and thereafter underwent an echocardiogram and stress test as noted below.  Shared decision was to uptitrate medical therapy and improve her modifable risk factors.   However, patient continued to have effort related dyspnea, decreased physical endurance, and despite unremarkable noninvasive work-up balance ischemia could not be ruled out and therefore right and left heart catheterization was recommended for further evaluation.  Patient was noted to have nonobstructive CAD and no pulmonary hypertension on recent angiography.  She now presents for follow-up.  Clinically patient states that her shortness of breath has improved significantly over the last 4 weeks.  Her rheumatologist has discontinued methotrexate as it could cause shortness of breath and she is noticing significant improvement.  She denies any chest pain or anginal discomfort.  Patient states that her home blood pressures are also running soft with a systolic blood pressures ranging between 97-103 mmHg.   Her blood work notes renal insufficiency which may be secondary to soft blood pressures.  Since last visit patient was also able to have Nexlizet filled via specialty pharmacy and she is tolerating the medication well along with simvastatin without any side effects or intolerances.  ALLERGIES: Allergies  Allergen Reactions  . Omeprazole Shortness Of Breath  . Peanut-Containing Drug Products Anaphylaxis  . Shellfish Allergy Anaphylaxis  . Ixekizumab Swelling    At injection site  Taltz  . Penicillins Other (See Comments)    Corners of mouth started splitting  Reaction: 30 years  . Sulfa Antibiotics Other (See Comments)    Corners of mouth started splitting  . Allegra [Fexofenadine] Cough  . Leflunomide Rash  . Lyrica [Pregabalin] Other (See Comments)    Causes rheumatoid flares     MEDICATION LIST PRIOR TO VISIT: Current Outpatient Medications on File Prior to Visit  Medication Sig Dispense Refill  . acetaminophen (TYLENOL) 500 MG tablet Take 1,000 mg by mouth every 6 (six) hours as needed for mild pain or headache.    . albuterol (PROAIR HFA) 108 (90 Base) MCG/ACT inhaler Inhale 2 puffs into the lungs every 6 (six) hours as needed for wheezing or shortness of breath. 1 Inhaler 3  . amLODipine (NORVASC) 5 MG tablet Take 5 mg by mouth daily.    Marland Kitchen aspirin 81 MG EC tablet Take 1 tablet (81 mg total) by mouth daily. Swallow whole. 90 tablet 0  . azelastine (ASTELIN) 0.1 % nasal spray Place 2 sprays into both nostrils daily. Use in each nostril as directed    . Bempedoic Acid-Ezetimibe (NEXLIZET) 180-10 MG TABS Take 1 tablet by mouth daily. 90 tablet 1  . budesonide-formoterol (SYMBICORT) 160-4.5 MCG/ACT inhaler Inhale 2 puffs into the lungs 2 (two) times  daily. 10.2 g 5  . carboxymethylcellulose (REFRESH PLUS) 0.5 % SOLN Place 1 drop into both eyes 3 (three) times daily as needed (allergies).    . Certolizumab Pegol (CIMZIA [place]) Inject into the skin every 30 (thirty) days.    .  Cholecalciferol (VITAMIN D3) 2000 units TABS Take 2,000 Units by mouth daily.    . clindamycin (CLEOCIN) 300 MG capsule Take 300 mg by mouth as needed (Pre dental procedures).    . EPIPEN 2-PAK 0.3 MG/0.3ML SOAJ injection Inject 0.3 mg into the muscle as needed for anaphylaxis.    . folic acid (FOLVITE) 1 MG tablet Take 2 mg by mouth daily.  3  . ISOtretinoin (ACCUTANE) 40 MG capsule Take 40 mg by mouth every 3 (three) days. Claravis    . lansoprazole (PREVACID) 15 MG capsule Take 15 mg by mouth daily at 12 noon.    Marland Kitchen levocetirizine (XYZAL) 5 MG tablet Take 5 mg by mouth at bedtime.     . metoprolol succinate (TOPROL-XL) 50 MG 24 hr tablet TAKE 1 TABLET (50 MG TOTAL) BY MOUTH DAILY. TAKE WITH OR IMMEDIATELY FOLLOWING A MEAL. 90 tablet 2  . montelukast (SINGULAIR) 10 MG tablet Take 10 mg by mouth at bedtime.    . simvastatin (ZOCOR) 10 MG tablet TAKE 1 TABLET BY MOUTH EVERYDAY AT BEDTIME 90 tablet 0  . valACYclovir (VALTREX) 500 MG tablet Take 500 mg by mouth daily.    . methotrexate (RHEUMATREX) 2.5 MG tablet Take 15 mg by mouth once a week. 7 tablets once a week  2   No current facility-administered medications on file prior to visit.    PAST MEDICAL HISTORY: Past Medical History:  Diagnosis Date  . Acid reflux   . AKI (acute kidney injury) (HCC) 06/07/2015  . Allergic sinusitis   . Anemia   . CAP (community acquired pneumonia) 06/07/2015  . Chest pain 01/01/2014  . Coronary artery calcification   . Hyperlipidemia   . Hypertension   . Hypoxia   . Rheumatoid arthritis (HCC)    on Leflunomide   . Rheumatoid arthritis(714.0)    on Leflunomide    PAST SURGICAL HISTORY: Past Surgical History:  Procedure Laterality Date  . ABDOMINAL HYSTERECTOMY  1985   Partial  . BUNIONECTOMY  2002,12/18/2006  . RIGHT/LEFT HEART CATH AND CORONARY ANGIOGRAPHY N/A 06/14/2020   Procedure: RIGHT/LEFT HEART CATH AND CORONARY ANGIOGRAPHY;  Surgeon: Yates Decamp, MD;  Location: [facility];  Service:  Cardiovascular;  Laterality: N/A;    FAMILY HISTORY: The patient's family history includes Cancer in her brother; Heart disease in her father; Hypertension in her sister; Lupus in her brother.   SOCIAL HISTORY:  The patient  reports that she quit smoking about 17 years ago. Her smoking use included cigarettes. She has a 40.00 pack-year smoking history. She has never used smokeless tobacco. She reports that she does not drink alcohol and does not use drugs.  Review of Systems  Constitutional: Negative for decreased appetite, malaise/fatigue, weight gain and weight loss.  Eyes: Negative for visual disturbance.  Cardiovascular: Positive for dyspnea on exertion (improving). Negative for chest pain, claudication, leg swelling, orthopnea, palpitations and syncope.  Respiratory: Negative for hemoptysis and wheezing.   Endocrine: Negative for cold intolerance and heat intolerance.  Hematologic/Lymphatic: Does not bruise/bleed easily.  Skin: Negative for nail changes.  Musculoskeletal: Positive for back pain and joint pain. Negative for muscle weakness and myalgias.  Gastrointestinal: Negative for abdominal pain, change in bowel habit, nausea and  vomiting.  Neurological: Negative for difficulty with concentration, dizziness, focal weakness and headaches.  Psychiatric/Behavioral: Negative for altered mental status and suicidal ideas.  All other systems reviewed and are negative.   PHYSICAL EXAM: Vitals with BMI 07/15/2020 06/14/2020 06/14/2020  Height 5\' 6"  - -  Weight 200 lbs - -  BMI 32.3 - -  Systolic 109 110  Diastolic 65 61 60  Pulse 70 67 66    CONSTITUTIONAL: Well-developed and well-nourished. No acute distress.  SKIN: Skin is warm and dry. No rash noted. No cyanosis. No pallor. No jaundice HEAD: Normocephalic and atraumatic.  EYES: No scleral icterus MOUTH/THROAT: Moist oral membranes.  NECK: No JVD present. No thyromegaly noted. No carotid bruits  LYMPHATIC: No visible  cervical adenopathy.  CHEST Normal respiratory effort. No intercostal retractions  LUNGS: Decreased breath sounds at the bases.  No stridor. No wheezes. No rales.  CARDIOVASCULAR: Regular, positive S1-S2, soft holosystolic murmur heard at the apex.  No gallops or rubs. ABDOMINAL: No apparent ascites.  EXTREMITIES: No peripheral edema.  psoriasis on bilateral palms. HEMATOLOGIC: No significant bruising NEUROLOGIC: Oriented to person, place, and time. Nonfocal. Normal muscle tone.  PSYCHIATRIC: Normal mood and affect. Normal behavior. Cooperative  CARDIAC DATABASE: CT of the chest  09/25/2017:  coronary atherosclerosis of LAD and left circumflex. Mild atherosclerotic 11/25/2017 conditions of the aortic arch. Stable 6 mm nodule in right lower lobe likely benign.  EKG: 05/23/2020: Normal sinus rhythm, 76 bpm, nonspecific T wave abnormality, without underlying injury pattern.  Echocardiogram: 05/04/2019: LVEF 66% grade 1 diastolic impairment, normal left atrial pressure, mild AR, mild MR, mild TR, RVSP 29 mmHg, no pulmonary hypertension per echocardiogram.  Stress Testing:  Exercise myoview stress 04/26/2017: Exercised for 4 minutes and 50 seconds, achieved 4.6 METS, 97% of maximum predicted heart rate, low exercise capacity, stress ECG negative for ischemia. Stress and rest SPECT images demonstrate homogeneous tracer distribution throughout the myocardium. Gated SPECT imaging reveals normal myocardial thickening and wall motion.  LVEF 72%.  Low risk study.  Right and left heart catheterization 06/14/2020: Right heart catheterization: RA 1/4, mean -1 mmHg; RV 24/2, EDP 5 mmHg; PA 19/7, mean 13; PA saturation 76%.  PW 5/3, mean 2 mmHg; aortic saturation 100%. CO 7.42, CI 3.7 by Fick.  QP/QS 1.00. Left heart catheterization: LV: Normal LV systolic function without wall motion abnormality.  Normal EDP at 16 mmHg.  No pressure gradient across the aortic valve. Left main: Large vessel, mild coronary  calcification noted. LAD: Severe extraluminal calcification noted on the proximal LAD.  Mid LAD has a 20 to 30% stenosis with moderate diffuse calcification.  Proximal large D1 has a 40 to 50% stenosis, again calcified. CX: Large caliber vessel.  Continues as a large OM 2.  Minimal luminal irregularity.  Mild calcification in the proximal segment. RI: Very small vessel with mild proximal calcification. RCA: Dominant, moderate size vessel, mild luminal irregularity, mild proximal coronary calcification.  Recommendation: Patient's symptoms of dyspnea are probably noncardiac.  No significant coronary disease.  25 to 30 mL contrast utilized.  48-hour Holter monitor 11/20/2018: Normal sinus rhythm.  Rare PAC.  No reported symptoms.  LABORATORY DATA: CBC Latest Ref Rng & Units 06/14/2020 06/14/2020 06/14/2020  WBC 3.4 - 10.8 x10E3/uL - - -  Hemoglobin 12.0 - 15.0 g/dL 10.5(L) 10.9(L) 10.9(L)  Hematocrit 36.0 - 46.0 % 31.0(L) 32.0(L) 32.0(L)  Platelets 150 - 450 x10E3/uL - - -    CMP Latest Ref Rng & Units 07/12/2020 06/22/2020 06/14/2020  Glucose 65 - 99 mg/dL 161(W) 71 -  BUN 8 - 27 mg/dL 20 19 -  Creatinine 9.60 - 1.00 mg/dL 4.54(U) 9.81(X) -  Sodium 134 - 144 mmol/L 140 138 140  Potassium 3.5 - 5.2 mmol/L 4.3 5.0 4.1  Chloride 96 - 106 mmol/L 103 101 -  CO2 20 - 29 mmol/L 23 24 -  Calcium 8.7 - 10.3 mg/dL 91.4 9.5 -  Total Protein 6.0 - 8.5 g/dL - - -  Total Bilirubin 0.0 - 1.2 mg/dL - - -  Alkaline Phos 44 - 121 IU/L - - -  AST 0 - 40 IU/L - - -  ALT 0 - 32 IU/L - - -    Lipid Panel     Component Value Date/Time   CHOL 211 (H) 05/19/2020 0905   TRIG 86 05/19/2020 0905   HDL 76 05/19/2020 0905   CHOLHDL 2.6 06/23/2019 1108   CHOLHDL 2.7 01/02/2014 0858   VLDL 9 01/02/2014 0858   LDLCALC 120 (H) 05/19/2020 0905   LDLDIRECT 106 (H) 05/19/2020 0905   LABVLDL 15 05/19/2020 0905    Lab Results  Component Value Date   HGBA1C 5.8 (H) 06/08/2015   No components found for:  NTPROBNP No results found for: TSH  Cardiac Panel (last 3 results) No results for input(s): CKTOTAL, CKMB, TROPONINIHS, RELINDX in the last 72 hours.  IMPRESSION:    ICD-10-CM   1. Dyspnea on exertion  R06.00 valsartan (DIOVAN) 40 MG tablet    hydrochlorothiazide (MICROZIDE) 12.5 MG capsule  2. Nonobstructive atherosclerosis of coronary artery  I25.10   3. Coronary artery calcification seen on CT scan  I25.10   4. Mild hyperlipidemia  E78.5   5. Benign hypertension  I10 valsartan (DIOVAN) 40 MG tablet    hydrochlorothiazide (MICROZIDE) 12.5 MG capsule    Basic metabolic panel    Magnesium  6. Mixed hyperlipidemia  E78.2   7. Class 1 obesity due to excess calories with serious comorbidity and body mass index (BMI) of 32.0 to 32.9 in adult  E66.09    Z68.32      RECOMMENDATIONS: Cheyenne Gould is a 75 y.o. 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  Dyspnea on exertion:  Improving.  Patient has stopped taking methotrexate for the last 4 weeks as recommended and her symptoms have improved.  Reviewed the results of the left and right heart catheterization with the patient at today's office visit.  Patient is noted to have nonobstructive CAD and as per right heart hemodynamics the mean PA pressure is not consistent with pulmonary hypertension.  From a cardiovascular standpoint no additional testing is needed at this time.  Recommend following up with pulmonary medicine with regards to work-up of interstitial lung disease.  Benign essential hypertension:  Patient states that her systolic blood pressures at home are quite soft which range between 97-103 mmHg.  Given her renal function recommend reducing the dose of  Diovan/hydrochlorothiazide.  Discontinue Diovan/hydrochlorothiazide given renal insufficiency.  Will prescribe Diovan 40 mg p.o. every afternoon  Will prescribe HCTZ 12.5 mg p.o. every morning.  Blood work in 1 week to evaluate kidney function and electrolytes.  Coronary artery calcification:   Continue aspirin, simvastatin, Nexlizet.    Has been on Nexlizet for the last 3-4 weeks  Recommend checking a fasting lipid profile prior to the  next office visit.  Patient states that she has upcoming labs with her PCP and will bring it in at the next visit otherwise will call the office for blood work to be placed.  Mixed hyperlipidemia: Continue simvastatin and Nexlizet.  Most recent lipid profile independently reviewed.  Obesity, due to excess calories: Body mass index is 32.28 kg/m. . I reviewed with the patient the importance of diet, regular physical activity/exercise, weight loss.   . Patient is educated on increasing physical activity gradually as tolerated.  With the goal of moderate intensity exercise for 30 minutes a day 5 days a week.  FINAL MEDICATION LIST END OF ENCOUNTER: Meds ordered this encounter  Medications  . valsartan (DIOVAN) 40 MG tablet    Sig: Take 1 tablet (40 mg total) by mouth every evening.    Dispense:  90 tablet    Refill:  0  . hydrochlorothiazide (MICROZIDE) 12.5 MG capsule    Sig: Take 1 capsule (12.5 mg total) by mouth every morning.    Dispense:  90 capsule    Refill:  0    Medications Discontinued During This Encounter  Medication Reason  . valsartan-hydrochlorothiazide (DIOVAN-HCT) 160-12.5 MG tablet Dose change     Current Outpatient Medications:  .  acetaminophen (TYLENOL) 500 MG tablet, Take 1,000 mg by mouth every 6 (six) hours as needed for mild pain or headache., Disp: , Rfl:  .  albuterol (PROAIR HFA) 108 (90 Base) MCG/ACT inhaler, Inhale 2 puffs into the lungs every 6 (six) hours as needed for wheezing or shortness of breath., Disp: 1  Inhaler, Rfl: 3 .  amLODipine (NORVASC) 5 MG tablet, Take 5 mg by mouth daily., Disp: , Rfl:  .  aspirin 81 MG EC tablet, Take 1 tablet (81 mg total) by mouth daily. Swallow whole., Disp: 90 tablet, Rfl: 0 .  azelastine (ASTELIN) 0.1 % nasal spray, Place 2 sprays into both nostrils daily. Use in each nostril as directed, Disp: , Rfl:  .  Bempedoic Acid-Ezetimibe (NEXLIZET) 180-10 MG TABS, Take 1 tablet by mouth daily., Disp: 90 tablet, Rfl: 1 .  budesonide-formoterol (SYMBICORT) 160-4.5 MCG/ACT inhaler, Inhale 2 puffs into the lungs 2 (two) times daily., Disp: 10.2 g, Rfl: 5 .  carboxymethylcellulose (REFRESH PLUS) 0.5 % SOLN, Place 1 drop into both eyes 3 (three) times daily as needed (allergies)., Disp: , Rfl:  .  Certolizumab Pegol (CIMZIA [place]), Inject into the skin every 30 (thirty) days., Disp: , Rfl:  .  Cholecalciferol (VITAMIN D3) 2000 units TABS, Take 2,000 Units by mouth daily., Disp: , Rfl:  .  clindamycin (CLEOCIN) 300 MG capsule, Take 300 mg by mouth as needed (Pre dental procedures)., Disp: , Rfl:  .  EPIPEN 2-PAK 0.3 MG/0.3ML SOAJ injection, Inject 0.3 mg into the muscle as needed for anaphylaxis., Disp: , Rfl:  .  folic acid (FOLVITE) 1 MG tablet, Take 2 mg by mouth daily., Disp: , Rfl: 3 .  hydrochlorothiazide (MICROZIDE) 12.5 MG capsule, Take 1 capsule (12.5 mg total) by mouth every morning., Disp: 90 capsule, Rfl: 0 .  ISOtretinoin (ACCUTANE) 40 MG capsule, Take 40 mg by mouth every 3 (three) days. Claravis, Disp: , Rfl:  .  lansoprazole (PREVACID) 15 MG capsule, Take 15 mg by mouth daily at 12 noon., Disp: , Rfl:  .  levocetirizine (XYZAL) 5 MG tablet, Take 5 mg by mouth at bedtime. , Disp: , Rfl:  .  metoprolol succinate (TOPROL-XL) 50 MG 24 hr tablet, TAKE 1 TABLET (50 MG  TOTAL) BY MOUTH DAILY. TAKE WITH OR IMMEDIATELY FOLLOWING A MEAL., Disp: 90 tablet, Rfl: 2 .  montelukast (SINGULAIR) 10 MG tablet, Take 10 mg by mouth at bedtime., Disp: , Rfl:  .  simvastatin (ZOCOR) 10 MG  tablet, TAKE 1 TABLET BY MOUTH EVERYDAY AT BEDTIME, Disp: 90 tablet, Rfl: 0 .  valACYclovir (VALTREX) 500 MG tablet, Take 500 mg by mouth daily., Disp: , Rfl:  .  valsartan (DIOVAN) 40 MG tablet, Take 1 tablet (40 mg total) by mouth every evening., Disp: 90 tablet, Rfl: 0 .  methotrexate (RHEUMATREX) 2.5 MG tablet, Take 15 mg by mouth once a week. 7 tablets once a week, Disp: , Rfl: 2  Orders Placed This Encounter  Procedures  . Basic metabolic panel  . Magnesium   --Continue cardiac medications as reconciled in final medication list. --Return in about 6 months (around 01/15/2021) for Follow up, Dyspnea, nonobstructive CAD.. Or sooner if needed. --Continue follow-up with your primary care physician regarding the management of your other chronic comorbid conditions.  Patient's questions and concerns were addressed to her satisfaction. She voices understanding of the instructions provided during this encounter.   This note was created using a voice recognition software as a result there may be grammatical errors inadvertently enclosed that do not reflect the nature of this encounter. Every attempt is made to correct such errors.  Total time spent: 33 minutes  Delilah Shan [facility]  Pager: [phone] Office: [phone]

## 2020-07-19 ENCOUNTER — Other Ambulatory Visit: Payer: Self-pay

## 2020-07-19 DIAGNOSIS — I10 Essential (primary) hypertension: Secondary | ICD-10-CM

## 2020-07-20 ENCOUNTER — Telehealth: Payer: Self-pay

## 2020-07-20 DIAGNOSIS — M0589 Other rheumatoid arthritis with rheumatoid factor of multiple sites: Secondary | ICD-10-CM | POA: Diagnosis not present

## 2020-07-20 NOTE — Telephone Encounter (Signed)
I dont fill Valtrex. She needs to call her PCP.  I had made no comments for her Valtrex

## 2020-07-20 NOTE — Telephone Encounter (Signed)
Patient called and said that you told her to take her valtrex different and wanted to know if you could send in for twice a day. ???? Please advise. Thanks

## 2020-07-21 ENCOUNTER — Ambulatory Visit: Payer: Federal, State, Local not specified - PPO

## 2020-07-22 NOTE — Telephone Encounter (Signed)
Called patient, she is aware we do not fill Valtrex, but she has already had it filled.

## 2020-07-27 DIAGNOSIS — L400 Psoriasis vulgaris: Secondary | ICD-10-CM | POA: Diagnosis not present

## 2020-07-28 DIAGNOSIS — I10 Essential (primary) hypertension: Secondary | ICD-10-CM | POA: Diagnosis not present

## 2020-07-29 LAB — BASIC METABOLIC PANEL
BUN/Creatinine Ratio: 16 (ref 12–28)
BUN: 25 mg/dL (ref 8–27)
CO2: 22 mmol/L (ref 20–29)
Calcium: 9.4 mg/dL (ref 8.7–10.3)
Chloride: 100 mmol/L (ref 96–106)
Creatinine, Ser: 1.53 mg/dL — ABNORMAL HIGH (ref 0.57–1.00)
Glucose: 58 mg/dL — ABNORMAL LOW (ref 65–99)
Potassium: 4.1 mmol/L (ref 3.5–5.2)
Sodium: 137 mmol/L (ref 134–144)
eGFR: 36 mL/min/{1.73_m2} — ABNORMAL LOW (ref >59)

## 2020-07-29 LAB — MAGNESIUM: Magnesium: 1.8 mg/dL (ref 1.6–2.3)

## 2020-08-01 ENCOUNTER — Other Ambulatory Visit: Payer: Self-pay | Admitting: Cardiology

## 2020-08-01 DIAGNOSIS — I2510 Atherosclerotic heart disease of native coronary artery without angina pectoris: Secondary | ICD-10-CM

## 2020-08-01 NOTE — Progress Notes (Signed)
Called pt to inform her about her lab results pt understood.

## 2020-08-02 ENCOUNTER — Other Ambulatory Visit (HOSPITAL_BASED_OUTPATIENT_CLINIC_OR_DEPARTMENT_OTHER): Payer: Self-pay

## 2020-08-02 ENCOUNTER — Other Ambulatory Visit: Payer: Self-pay

## 2020-08-02 ENCOUNTER — Ambulatory Visit: Payer: Medicare Other | Attending: Internal Medicine

## 2020-08-02 DIAGNOSIS — Z23 Encounter for immunization: Secondary | ICD-10-CM

## 2020-08-02 MED ORDER — COVID-19 MRNA VACC (MODERNA) 100 MCG/0.5ML IM SUSP
INTRAMUSCULAR | 0 refills | Status: DC
Start: 2020-08-02 — End: 2021-01-16
  Filled 2020-08-02: qty 0.25, 1d supply, fill #0

## 2020-08-02 NOTE — Progress Notes (Signed)
   Covid-19 Vaccination Clinic  Name:  Cheyenne Gould    MRN: [id number] DOB: February 17, 1946  08/02/2020  [name] was observed post Covid-19 immunization for 15 minutes without incident. She was provided with Vaccine Information Sheet and instruction to access the V-Safe system.   [name] was instructed to call 911 with any severe reactions post vaccine: Difficulty breathing  Swelling of face and throat  A fast heartbeat  A bad rash all over body  Dizziness and weakness   Immunizations Administered     Name Date Dose VIS Date Route   Moderna Covid-19 Booster Vaccine 08/02/2020  9:12 AM 0.25 mL 12/09/2019 Intramuscular   Manufacturer: Moderna   Lot: [id number]   NDC: 74734-037-09

## 2020-08-13 ENCOUNTER — Other Ambulatory Visit: Payer: Self-pay | Admitting: Cardiology

## 2020-08-13 ENCOUNTER — Other Ambulatory Visit: Payer: Self-pay | Admitting: Pulmonary Disease

## 2020-08-13 DIAGNOSIS — I2510 Atherosclerotic heart disease of native coronary artery without angina pectoris: Secondary | ICD-10-CM

## 2020-08-15 NOTE — Telephone Encounter (Signed)
Patient requesting refill on Singulair. Historical provider is listed in chart. Last OV 04/05/20. Is it ok to refill Singulair.  Dr. Isaiah Serge please advise  Thank you

## 2020-08-18 DIAGNOSIS — I10 Essential (primary) hypertension: Secondary | ICD-10-CM | POA: Diagnosis not present

## 2020-08-18 DIAGNOSIS — E785 Hyperlipidemia, unspecified: Secondary | ICD-10-CM | POA: Diagnosis not present

## 2020-08-18 DIAGNOSIS — M0589 Other rheumatoid arthritis with rheumatoid factor of multiple sites: Secondary | ICD-10-CM | POA: Diagnosis not present

## 2020-09-01 ENCOUNTER — Other Ambulatory Visit: Payer: Self-pay | Admitting: Cardiology

## 2020-09-05 DIAGNOSIS — R0602 Shortness of breath: Secondary | ICD-10-CM | POA: Diagnosis not present

## 2020-09-05 DIAGNOSIS — M150 Primary generalized (osteo)arthritis: Secondary | ICD-10-CM | POA: Diagnosis not present

## 2020-09-05 DIAGNOSIS — E669 Obesity, unspecified: Secondary | ICD-10-CM | POA: Diagnosis not present

## 2020-09-05 DIAGNOSIS — L4059 Other psoriatic arthropathy: Secondary | ICD-10-CM | POA: Diagnosis not present

## 2020-09-05 DIAGNOSIS — M5136 Other intervertebral disc degeneration, lumbar region: Secondary | ICD-10-CM | POA: Diagnosis not present

## 2020-09-05 DIAGNOSIS — Z6831 Body mass index (BMI) 31.0-31.9, adult: Secondary | ICD-10-CM | POA: Diagnosis not present

## 2020-09-05 DIAGNOSIS — L401 Generalized pustular psoriasis: Secondary | ICD-10-CM | POA: Diagnosis not present

## 2020-09-05 DIAGNOSIS — M25512 Pain in left shoulder: Secondary | ICD-10-CM | POA: Diagnosis not present

## 2020-09-05 DIAGNOSIS — M0589 Other rheumatoid arthritis with rheumatoid factor of multiple sites: Secondary | ICD-10-CM | POA: Diagnosis not present

## 2020-09-07 DIAGNOSIS — L400 Psoriasis vulgaris: Secondary | ICD-10-CM | POA: Diagnosis not present

## 2020-09-07 DIAGNOSIS — L403 Pustulosis palmaris et plantaris: Secondary | ICD-10-CM | POA: Diagnosis not present

## 2020-09-16 ENCOUNTER — Other Ambulatory Visit: Payer: Self-pay | Admitting: Cardiology

## 2020-10-06 DIAGNOSIS — L400 Psoriasis vulgaris: Secondary | ICD-10-CM | POA: Diagnosis not present

## 2020-10-06 DIAGNOSIS — L403 Pustulosis palmaris et plantaris: Secondary | ICD-10-CM | POA: Diagnosis not present

## 2020-10-11 DIAGNOSIS — E785 Hyperlipidemia, unspecified: Secondary | ICD-10-CM | POA: Diagnosis not present

## 2020-10-11 DIAGNOSIS — M0589 Other rheumatoid arthritis with rheumatoid factor of multiple sites: Secondary | ICD-10-CM | POA: Diagnosis not present

## 2020-10-11 DIAGNOSIS — I10 Essential (primary) hypertension: Secondary | ICD-10-CM | POA: Diagnosis not present

## 2020-10-11 DIAGNOSIS — J441 Chronic obstructive pulmonary disease with (acute) exacerbation: Secondary | ICD-10-CM | POA: Diagnosis not present

## 2020-10-11 DIAGNOSIS — M0540 Rheumatoid myopathy with rheumatoid arthritis of unspecified site: Secondary | ICD-10-CM | POA: Diagnosis not present

## 2020-10-18 ENCOUNTER — Other Ambulatory Visit: Payer: Self-pay | Admitting: Cardiology

## 2020-10-18 DIAGNOSIS — I10 Essential (primary) hypertension: Secondary | ICD-10-CM

## 2020-10-18 DIAGNOSIS — R0609 Other forms of dyspnea: Secondary | ICD-10-CM

## 2020-10-18 DIAGNOSIS — R0600 Dyspnea, unspecified: Secondary | ICD-10-CM

## 2020-10-19 DIAGNOSIS — M0589 Other rheumatoid arthritis with rheumatoid factor of multiple sites: Secondary | ICD-10-CM | POA: Diagnosis not present

## 2020-10-19 DIAGNOSIS — I10 Essential (primary) hypertension: Secondary | ICD-10-CM | POA: Diagnosis not present

## 2020-10-19 DIAGNOSIS — E785 Hyperlipidemia, unspecified: Secondary | ICD-10-CM | POA: Diagnosis not present

## 2020-11-09 DIAGNOSIS — J441 Chronic obstructive pulmonary disease with (acute) exacerbation: Secondary | ICD-10-CM | POA: Diagnosis not present

## 2020-11-09 DIAGNOSIS — L401 Generalized pustular psoriasis: Secondary | ICD-10-CM | POA: Diagnosis not present

## 2020-11-09 DIAGNOSIS — M130 Polyarthritis, unspecified: Secondary | ICD-10-CM | POA: Diagnosis not present

## 2020-11-09 DIAGNOSIS — I10 Essential (primary) hypertension: Secondary | ICD-10-CM | POA: Diagnosis not present

## 2020-11-18 DIAGNOSIS — E785 Hyperlipidemia, unspecified: Secondary | ICD-10-CM | POA: Diagnosis not present

## 2020-11-18 DIAGNOSIS — M0589 Other rheumatoid arthritis with rheumatoid factor of multiple sites: Secondary | ICD-10-CM | POA: Diagnosis not present

## 2020-11-18 DIAGNOSIS — I10 Essential (primary) hypertension: Secondary | ICD-10-CM | POA: Diagnosis not present

## 2020-11-28 ENCOUNTER — Other Ambulatory Visit (HOSPITAL_BASED_OUTPATIENT_CLINIC_OR_DEPARTMENT_OTHER): Payer: Self-pay

## 2020-12-06 DIAGNOSIS — Z6830 Body mass index (BMI) 30.0-30.9, adult: Secondary | ICD-10-CM | POA: Diagnosis not present

## 2020-12-06 DIAGNOSIS — E669 Obesity, unspecified: Secondary | ICD-10-CM | POA: Diagnosis not present

## 2020-12-06 DIAGNOSIS — R0602 Shortness of breath: Secondary | ICD-10-CM | POA: Diagnosis not present

## 2020-12-06 DIAGNOSIS — M0589 Other rheumatoid arthritis with rheumatoid factor of multiple sites: Secondary | ICD-10-CM | POA: Diagnosis not present

## 2020-12-06 DIAGNOSIS — M150 Primary generalized (osteo)arthritis: Secondary | ICD-10-CM | POA: Diagnosis not present

## 2020-12-06 DIAGNOSIS — L4059 Other psoriatic arthropathy: Secondary | ICD-10-CM | POA: Diagnosis not present

## 2020-12-06 DIAGNOSIS — M25512 Pain in left shoulder: Secondary | ICD-10-CM | POA: Diagnosis not present

## 2020-12-06 DIAGNOSIS — M5136 Other intervertebral disc degeneration, lumbar region: Secondary | ICD-10-CM | POA: Diagnosis not present

## 2020-12-06 DIAGNOSIS — L401 Generalized pustular psoriasis: Secondary | ICD-10-CM | POA: Diagnosis not present

## 2020-12-07 DIAGNOSIS — L403 Pustulosis palmaris et plantaris: Secondary | ICD-10-CM | POA: Diagnosis not present

## 2020-12-07 DIAGNOSIS — Z79899 Other long term (current) drug therapy: Secondary | ICD-10-CM | POA: Diagnosis not present

## 2020-12-15 ENCOUNTER — Other Ambulatory Visit: Payer: Self-pay | Admitting: Pulmonary Disease

## 2020-12-19 DIAGNOSIS — M0589 Other rheumatoid arthritis with rheumatoid factor of multiple sites: Secondary | ICD-10-CM | POA: Diagnosis not present

## 2020-12-19 DIAGNOSIS — E785 Hyperlipidemia, unspecified: Secondary | ICD-10-CM | POA: Diagnosis not present

## 2020-12-19 DIAGNOSIS — I10 Essential (primary) hypertension: Secondary | ICD-10-CM | POA: Diagnosis not present

## 2020-12-20 ENCOUNTER — Ambulatory Visit: Payer: Medicare Other | Attending: Internal Medicine

## 2020-12-20 ENCOUNTER — Other Ambulatory Visit (HOSPITAL_BASED_OUTPATIENT_CLINIC_OR_DEPARTMENT_OTHER): Payer: Self-pay

## 2020-12-20 DIAGNOSIS — Z23 Encounter for immunization: Secondary | ICD-10-CM

## 2020-12-20 MED ORDER — MODERNA COVID-19 BIVAL BOOSTER 50 MCG/0.5ML IM SUSP
INTRAMUSCULAR | 0 refills | Status: DC
  Filled 2020-12-20: qty 0.5, 1d supply, fill #0

## 2020-12-20 MED ORDER — INFLUENZA VAC A&B SA ADJ QUAD 0.5 ML IM PRSY
PREFILLED_SYRINGE | INTRAMUSCULAR | 0 refills | Status: DC
  Filled 2020-12-20: qty 0.5, 1d supply, fill #0

## 2020-12-20 NOTE — Progress Notes (Signed)
   Covid-19 Vaccination Clinic  Name:  Rozanna Cormany    MRN: [id number] DOB: [birth year]  12/20/2020  Ms. Weathington was observed post Covid-19 immunization for 15 minutes without incident. She was provided with Vaccine Information Sheet and instruction to access the V-Safe system.   Ms. Mcquade was instructed to call 911 with any severe reactions post vaccine: Difficulty breathing  Swelling of face and throat  A fast heartbeat  A bad rash all over body  Dizziness and weakness   Immunizations Administered     Name Date Dose VIS Date Route   Moderna Covid-19 vaccine Bivalent Booster 12/20/2020 11:51 AM 0.5 mL 10/01/2020 Intramuscular   Manufacturer: Moderna   Lot: [id number]   NDC: 41638-453-64

## 2020-12-21 DIAGNOSIS — I10 Essential (primary) hypertension: Secondary | ICD-10-CM | POA: Diagnosis not present

## 2020-12-21 DIAGNOSIS — L401 Generalized pustular psoriasis: Secondary | ICD-10-CM | POA: Diagnosis not present

## 2020-12-21 DIAGNOSIS — E785 Hyperlipidemia, unspecified: Secondary | ICD-10-CM | POA: Diagnosis not present

## 2020-12-21 DIAGNOSIS — R5382 Chronic fatigue, unspecified: Secondary | ICD-10-CM | POA: Diagnosis not present

## 2020-12-22 DIAGNOSIS — Z1231 Encounter for screening mammogram for malignant neoplasm of breast: Secondary | ICD-10-CM | POA: Diagnosis not present

## 2020-12-27 DIAGNOSIS — H25813 Combined forms of age-related cataract, bilateral: Secondary | ICD-10-CM | POA: Diagnosis not present

## 2020-12-27 DIAGNOSIS — H04123 Dry eye syndrome of bilateral lacrimal glands: Secondary | ICD-10-CM | POA: Diagnosis not present

## 2021-01-10 ENCOUNTER — Other Ambulatory Visit: Payer: Self-pay | Admitting: Cardiology

## 2021-01-10 DIAGNOSIS — I2510 Atherosclerotic heart disease of native coronary artery without angina pectoris: Secondary | ICD-10-CM | POA: Diagnosis not present

## 2021-01-11 LAB — CMP14+EGFR
ALT: 8 IU/L (ref 0–32)
AST: 22 IU/L (ref 0–40)
Albumin/Globulin Ratio: 1.2 (ref 1.2–2.2)
Albumin: 3.9 g/dL (ref 3.7–4.7)
Alkaline Phosphatase: 137 IU/L — ABNORMAL HIGH (ref 44–121)
BUN/Creatinine Ratio: 12 (ref 12–28)
BUN: 16 mg/dL (ref 8–27)
Bilirubin Total: 0.4 mg/dL (ref 0.0–1.2)
CO2: 25 mmol/L (ref 20–29)
Calcium: 9.6 mg/dL (ref 8.7–10.3)
Chloride: 102 mmol/L (ref 96–106)
Creatinine, Ser: 1.34 mg/dL — ABNORMAL HIGH (ref 0.57–1.00)
Globulin, Total: 3.3 g/dL (ref 1.5–4.5)
Glucose: 90 mg/dL (ref 70–99)
Potassium: 4.6 mmol/L (ref 3.5–5.2)
Sodium: 139 mmol/L (ref 134–144)
Total Protein: 7.2 g/dL (ref 6.0–8.5)
eGFR: 42 mL/min/{1.73_m2} — ABNORMAL LOW (ref >59)

## 2021-01-11 LAB — LIPID PANEL WITH LDL/HDL RATIO
Cholesterol, Total: 183 mg/dL (ref 100–199)
HDL: 69 mg/dL (ref >39)
LDL Chol Calc (NIH): 103 mg/dL — ABNORMAL HIGH (ref 0–99)
LDL/HDL Ratio: 1.5 ratio (ref 0.0–3.2)
Triglycerides: 58 mg/dL (ref 0–149)
VLDL Cholesterol Cal: 11 mg/dL (ref 5–40)

## 2021-01-11 LAB — LDL CHOLESTEROL, DIRECT: LDL Direct: 98 mg/dL (ref 0–99)

## 2021-01-16 ENCOUNTER — Encounter: Payer: Self-pay | Admitting: Cardiology

## 2021-01-16 ENCOUNTER — Other Ambulatory Visit: Payer: Self-pay

## 2021-01-16 ENCOUNTER — Ambulatory Visit: Payer: Federal, State, Local not specified - PPO | Admitting: Cardiology

## 2021-01-16 VITALS — BP 109/69 | HR 68 | Resp 16 | Ht 66.0 in | Wt 191.0 lb

## 2021-01-16 DIAGNOSIS — E6609 Other obesity due to excess calories: Secondary | ICD-10-CM

## 2021-01-16 DIAGNOSIS — R0609 Other forms of dyspnea: Secondary | ICD-10-CM | POA: Diagnosis not present

## 2021-01-16 DIAGNOSIS — E785 Hyperlipidemia, unspecified: Secondary | ICD-10-CM | POA: Diagnosis not present

## 2021-01-16 DIAGNOSIS — I2510 Atherosclerotic heart disease of native coronary artery without angina pectoris: Secondary | ICD-10-CM

## 2021-01-16 DIAGNOSIS — I10 Essential (primary) hypertension: Secondary | ICD-10-CM | POA: Diagnosis not present

## 2021-01-16 DIAGNOSIS — Z6830 Body mass index (BMI) 30.0-30.9, adult: Secondary | ICD-10-CM | POA: Diagnosis not present

## 2021-01-16 MED ORDER — EZETIMIBE 10 MG PO TABS: 10.0000 mg | ORAL_TABLET | Freq: Every day | ORAL | 0 refills | Status: DC

## 2021-01-16 NOTE — Progress Notes (Signed)
Cheyenne Gould Date of Birth: 1945/11/21 MRN: [id number] Primary Care Provider:Bland, Myra Rude, MD Former Cardiology Providers: Jeri Lager, APRN, FNP-C, Dr. Adrian Prows Primary Cardiologist: Rex Kras, DO, [facility] (established care 07/23/2019)  Date: 01/16/21 Last Office Visit: 07/15/2020.   Chief Complaint  Patient presents with   Shortness of Breath   Follow-up    HPI  Cheyenne Gould is a 75 y.o.  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  Patient is being followed by our practice for the management of shortness of breath.  Referred to me for shortness of breath evaluation.  And on gated CT study she was noted to have CAC and underwent an ischemic evaluation including an echo and stress test.  Due to her continued shortness of breath and multiple cardiovascular risk factors she underwent left heart catheterization in April 2022 and was noted to have no significant coronary artery disease.  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  Patient shortness of breath improved significantly after the discontinuation of methotrexate in the past.  Since last office visit patient is implementing lifestyle changes and has lost 9 pounds.  Her blood pressure medications are currently being titrated by PCP.  She denies any chest pain at rest or with effort related activities.  And her shortness of breath is overall chronic and stable.  She appears to be euvolemic and not in congestive heart failure.  She was on Nexlizet  which was filled by specialty pharmacy but due to her running out of refills she is no longer taking it.  Most recent labs from 01/10/2021 independently reviewed with the patient at today's office visit.  ALLERGIES: Allergies  Allergen Reactions   Omeprazole Shortness Of Breath   Peanut-Containing Drug Products Anaphylaxis   Shellfish Allergy Anaphylaxis   Ixekizumab Swelling    At injection site  Taltz   Penicillins Other (See Comments)    Corners of mouth started splitting  Reaction: 30 years   Sulfa Antibiotics Other (See Comments)    Corners of mouth started splitting   Allegra [Fexofenadine] Cough   Leflunomide Rash   Lyrica [Pregabalin] Other (See Comments)    Causes rheumatoid flares     MEDICATION LIST PRIOR TO VISIT: Current Outpatient Medications on File Prior to Visit  Medication Sig Dispense Refill   acetaminophen (TYLENOL) 500 MG tablet Take 1,000 mg by mouth every 6 (six) hours as needed for mild pain or headache.     albuterol (PROAIR HFA) 108 (90 Base) MCG/ACT inhaler Inhale 2 puffs into the lungs every 6 (six) hours as needed for wheezing or shortness of breath. 1 Inhaler 3   amLODipine (NORVASC) 5 MG tablet Take 5 mg by mouth daily.     azelastine (ASTELIN) 0.1 % nasal spray Place 2 sprays into both nostrils daily. Use in each nostril as directed     budesonide-formoterol (SYMBICORT) 160-4.5 MCG/ACT inhaler Inhale 2 puffs into the lungs 2 (two) times daily. 10.2 g 5   Cholecalciferol (VITAMIN D3) 2000 units TABS Take 2,000 Units by mouth daily.     clindamycin (CLEOCIN) 300 MG capsule Take 300 mg by mouth  as needed (Pre dental procedures).     EPIPEN 2-PAK 0.3 MG/0.3ML SOAJ injection Inject 0.3 mg into the muscle as needed for anaphylaxis.     folic acid (FOLVITE) 1 MG tablet Take 2 mg by mouth daily.  3   lansoprazole (PREVACID) 15 MG capsule Take 15 mg by mouth daily at 12 noon.     levocetirizine (XYZAL) 5 MG tablet Take 5 mg by mouth at bedtime.       metoprolol succinate (TOPROL-XL) 50 MG 24 hr tablet TAKE 1 TABLET BY MOUTH DAILY. TAKE WITH OR IMMEDIATELY FOLLOWING A MEAL. 90 tablet 2   simvastatin (ZOCOR) 10 MG tablet TAKE 1 TABLET BY MOUTH EVERYDAY AT BEDTIME 90 tablet 0   valACYclovir (VALTREX) 500 MG tablet Take 500 mg by mouth daily.     TREMFYA 100 MG/ML SOPN Inject into the skin.     No current facility-administered medications on file prior to visit.    PAST MEDICAL HISTORY: Past Medical History:  Diagnosis Date   Acid reflux    AKI (acute kidney injury) ([place]) 06/07/2015   Allergic sinusitis    Anemia    CAP (community acquired pneumonia) 06/07/2015   Chest pain 01/01/2014   Coronary artery calcification    Hyperlipidemia    Hypertension    Hypoxia    Rheumatoid arthritis ([place])    on Leflunomide    Rheumatoid arthritis(714.0)    on Leflunomide    PAST SURGICAL HISTORY: Past Surgical History:  Procedure Laterality Date   ABDOMINAL HYSTERECTOMY  1985   Partial   BUNIONECTOMY  2002,12/18/2006   RIGHT/LEFT HEART CATH AND CORONARY ANGIOGRAPHY N/A 06/14/2020   Procedure: RIGHT/LEFT HEART CATH AND CORONARY ANGIOGRAPHY;  Surgeon: Adrian Prows, MD;  Location: [facility];  Service: Cardiovascular;  Laterality: N/A;    FAMILY HISTORY: The patient's family history includes Cancer in her brother; Heart disease in her father; Hypertension in her sister; Lupus in her brother.   SOCIAL HISTORY:  The patient  reports that she quit smoking about 17 years ago. Her smoking use included cigarettes. She has a 40.00 pack-year smoking history. She has never used smokeless tobacco. She reports that she does not drink alcohol and does not use drugs.  Review of Systems  Constitutional: Negative for decreased appetite, malaise/fatigue, weight gain and weight loss.  Eyes:  Negative for visual disturbance.  Cardiovascular:  Positive for dyspnea on exertion (improving). Negative for chest pain, claudication, leg swelling, orthopnea,  palpitations and syncope.  Respiratory:  Negative for hemoptysis and wheezing.   Endocrine: Negative for cold intolerance and heat intolerance.  Hematologic/Lymphatic: Does not bruise/bleed easily.  Skin:  Negative for nail changes.  Musculoskeletal:  Positive for back pain and joint pain. Negative for muscle weakness and myalgias.  Gastrointestinal:  Negative for abdominal pain, change in bowel habit, nausea and vomiting.  Neurological:  Negative for difficulty with concentration, dizziness, focal weakness and headaches.  Psychiatric/Behavioral:  Negative for altered mental status and suicidal ideas.   All other systems reviewed and are negative.  PHYSICAL EXAM: Vitals with BMI 01/16/2021 07/15/2020 06/14/2020  Height 5\' 6"  5\' 6"  -  Weight 191 lbs 200 lbs -  BMI [id number] -  Systolic 0000000 0000000 [id number]  Diastolic 69 65 61  Pulse 68 70 67    CONSTITUTIONAL: Well-developed and well-nourished. No acute distress.  SKIN: Skin is warm and dry. No rash noted. No cyanosis. No pallor. No jaundice HEAD: Normocephalic and atraumatic.  EYES: No scleral icterus MOUTH/THROAT:  Moist oral membranes.  NECK: No JVD present. No thyromegaly noted. No carotid bruits  LYMPHATIC: No visible cervical adenopathy.  CHEST Normal respiratory effort. No intercostal retractions  LUNGS: Decreased breath sounds at the bases.  No stridor. No wheezes. No rales.  CARDIOVASCULAR: Regular, positive [id number], soft holosystolic murmur heard at the apex.  No gallops or rubs. ABDOMINAL: Obese, soft, nontender, nondistended, positive bowel sounds all 4 quadrants. No apparent ascites.  EXTREMITIES: No peripheral edema.  psoriasis on bilateral palms. HEMATOLOGIC: No significant bruising NEUROLOGIC: Oriented to person, place, and time. Nonfocal. Normal muscle tone.  PSYCHIATRIC: Normal mood and affect. Normal behavior. Cooperative  CARDIAC DATABASE: CT of the chest  09/25/2017:  coronary atherosclerosis of LAD and left circumflex.  Mild atherosclerotic [place] conditions of the aortic arch. Stable 6 mm nodule in right lower lobe likely benign.  EKG: 01/16/2021: Sinus  Rhythm, 68 bpm, nonspecific T wave abnormality, without underlying injury pattern.  Echocardiogram: 05/04/2019: LVEF [id number] grade 1 diastolic impairment, normal left atrial pressure, mild AR, mild MR, mild TR, RVSP 29 mmHg, no pulmonary hypertension per echocardiogram.  Stress Testing:  Exercise myoview stress 04/26/2017: Exercised for 4 minutes and 50 seconds, achieved 4.6 METS, 97% of maximum predicted heart rate, low exercise capacity, stress ECG negative for ischemia. Stress and rest SPECT images demonstrate homogeneous tracer distribution throughout the myocardium. Gated SPECT imaging reveals normal myocardial thickening and wall motion.  LVEF 72%.  Low risk study.  Right and left heart catheterization 06/14/2020: Right heart catheterization: RA 1/4, mean -1 mmHg; RV 24/2, EDP 5 mmHg; PA 19/7, mean 13; PA saturation 76%.  PW 5/3, mean 2 mmHg; aortic saturation 100%. CO 7.42, CI 3.7 by Fick.  QP/QS 1.00. Left heart catheterization: LV: Normal LV systolic function without wall motion abnormality.  Normal EDP at 16 mmHg.  No pressure gradient across the aortic valve. Left main: Large vessel, mild coronary calcification noted. LAD: Severe extraluminal calcification noted on the proximal LAD.  Mid LAD has a 20 to 30% stenosis with moderate diffuse calcification.  Proximal large D1 has a 40 to 50% stenosis, again calcified. CX: Large caliber vessel.  Continues as a large OM 2.  Minimal luminal irregularity.  Mild calcification in the proximal segment. RI: Very small vessel with mild proximal calcification. RCA: Dominant, moderate size vessel, mild luminal irregularity, mild proximal coronary calcification.   Recommendation: Patient's symptoms of dyspnea are probably noncardiac.  No significant coronary disease.  25 to 30 mL contrast utilized.  48-hour Holter  monitor 11/20/2018: Normal sinus rhythm.  Rare PAC.  No reported symptoms.  LABORATORY DATA: CBC Latest Ref Rng & Units 06/14/2020 06/14/2020 06/14/2020  WBC 3.4 - 10.8 x10E3/uL - - -  Hemoglobin 12.0 - 15.0 g/dL 10.5(L) 10.9(L) 10.9(L)  Hematocrit 36.0 - 46.0 % 31.0(L) 32.0(L) 32.0(L)  Platelets 150 - 450 x10E3/uL - - -    CMP Latest Ref Rng & Units 01/10/2021 07/28/2020 07/12/2020  Glucose 70 - 99 mg/dL 90 58(L) 102(H)  BUN 8 - 27 mg/dL 16 25 20   Creatinine 0.57 - 1.00 mg/dL 1.34(H) 1.53(H) 1.54(H)  Sodium 134 - 144 mmol/L 139 137 140  Potassium 3.5 - 5.2 mmol/L 4.6 4.1 4.3  Chloride 96 - 106 mmol/L 102 100 103  CO2 20 - 29 mmol/L 25 22 23   Calcium 8.7 - 10.3 mg/dL 9.6 9.4 10.1  Total Protein 6.0 - 8.5 g/dL 7.2 - -  Total Bilirubin 0.0 - 1.2 mg/dL 0.4 - -  Alkaline Phos 44 - 121 IU/L  137(H) - -  AST 0 - 40 IU/L 22 - -  ALT 0 - 32 IU/L 8 - -    Lipid Panel  Lab Results  Component Value Date   CHOL 183 01/10/2021   HDL 69 01/10/2021   LDLCALC 103 (H) 01/10/2021   LDLDIRECT 98 01/10/2021   TRIG 58 01/10/2021   CHOLHDL 2.6 06/23/2019     Lab Results  Component Value Date   HGBA1C 5.8 (H) 06/08/2015   No components found for: NTPROBNP No results found for: TSH  Cardiac Panel (last 3 results) No results for input(s): CKTOTAL, CKMB, TROPONINIHS, RELINDX in the last 72 hours.  IMPRESSION:    ICD-10-CM   1. Nonobstructive atherosclerosis of coronary artery  I25.10 EKG 12-Lead    ezetimibe (ZETIA) 10 MG tablet    2. Coronary artery calcification seen on CT scan  I25.10 ezetimibe (ZETIA) 10 MG tablet    3. Dyspnea on exertion  R06.09     4. Benign hypertension  I10     5. Mild hyperlipidemia  E78.5 ezetimibe (ZETIA) 10 MG tablet    6. Class 1 obesity due to excess calories with serious comorbidity and body mass index (BMI) of 30.0 to 30.9 in adult  E66.09    Z68.30        RECOMMENDATIONS: Cheyenne Gould is a 75 y.o. 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  Nonobstructive atherosclerosis of coronary artery / Coronary artery calcification seen on CT scan Remains chest pain-free. Medications reconciled. Currently not on Nexlizet due to it being cost prohibitive.  We will transition her to Zetia. EKG notes normal sinus rhythm.  Has undergone ischemic evaluation in the recent past. As the patient remains asymptomatic, implementing lifestyle changes to improve her cardiovascular risk factors, no additional cardiovascular testing warranted at this time.  Dyspnea on exertion Significantly improved. Now only present with over exertional activities. Continue to monitor  Benign hypertension Blood pressures currently well controlled. Her medications are currently being titrated by PCP, per patient. Medication reconciled, per patient valsartan/hydrochlorothiazide 160/12.5 mg half a tablet daily. Recent labs from 01/10/2021 independently reviewed.  Renal function has improved. We emphasized the importance of a low-salt diet.  Mild hyperlipidemia Currently on simvastatin.   Not able to forward Nexlizet; therefore, shared decision was to restart Zetia 10 mg p.o. daily. She denies myalgia or other side effects. Most recent lipids dated 01/10/2021 reviewed as noted above. Patient's alkaline phosphatase is elevated, I do not have any prior levels for comparison.  I have asked her to discuss this further with PCP.  Patient states that she sees her Friday.  Class 1 obesity due to excess calories with serious comorbidity and body mass index (BMI) of 30.0 to 30.9 in adult Body mass index is 30.83 kg/m. I reviewed with the patient the importance of diet, regular physical activity/exercise, weight loss.   Patient is  educated on increasing physical activity gradually as tolerated.  With the goal of moderate intensity exercise for 30 minutes a day 5 days a week.  FINAL MEDICATION LIST END OF ENCOUNTER: Meds ordered this encounter  Medications   ezetimibe (ZETIA) 10 MG tablet    Sig: Take 1 tablet (10 mg total) by mouth daily.    Dispense:  90 tablet    Refill:  0     Medications  Discontinued During This Encounter  Medication Reason   carboxymethylcellulose (REFRESH PLUS) 0.5 % SOLN    Certolizumab Pegol (CIMZIA [place])    COVID-19 mRNA bivalent vaccine, Moderna, (MODERNA COVID-19 BIVAL BOOSTER) 50 MCG/0.5ML injection    COVID-19 mRNA vaccine, Moderna, 100 MCG/0.5ML injection    influenza vaccine adjuvanted (FLUAD) 0.5 ML injection    ISOtretinoin (ACCUTANE) 40 MG capsule    methotrexate (RHEUMATREX) 2.5 MG tablet    montelukast (SINGULAIR) 10 MG tablet    valsartan (DIOVAN) 40 MG tablet    Bempedoic Acid-Ezetimibe (NEXLIZET) 180-10 MG TABS Change in therapy   hydrochlorothiazide (MICROZIDE) 12.5 MG capsule Patient Preference   valsartan (DIOVAN) 160 MG tablet Patient Preference     Current Outpatient Medications:    acetaminophen (TYLENOL) 500 MG tablet, Take 1,000 mg by mouth every 6 (six) hours as needed for mild pain or headache., Disp: , Rfl:    albuterol (PROAIR HFA) 108 (90 Base) MCG/ACT inhaler, Inhale 2 puffs into the lungs every 6 (six) hours as needed for wheezing or shortness of breath., Disp: 1 Inhaler, Rfl: 3   amLODipine (NORVASC) 5 MG tablet, Take 5 mg by mouth daily., Disp: , Rfl:    azelastine (ASTELIN) 0.1 % nasal spray, Place 2 sprays into both nostrils daily. Use in each nostril as directed, Disp: , Rfl:    budesonide-formoterol (SYMBICORT) 160-4.5 MCG/ACT inhaler, Inhale 2 puffs into the lungs 2 (two) times daily., Disp: 10.2 g, Rfl: 5   Cholecalciferol (VITAMIN D3) 2000 units TABS, Take 2,000 Units by mouth daily., Disp: , Rfl:    clindamycin (CLEOCIN) 300 MG capsule, Take 300  mg by mouth as needed (Pre dental procedures)., Disp: , Rfl:    EPIPEN 2-PAK 0.3 MG/0.3ML SOAJ injection, Inject 0.3 mg into the muscle as needed for anaphylaxis., Disp: , Rfl:    ezetimibe (ZETIA) 10 MG tablet, Take 1 tablet (10 mg total) by mouth daily., Disp: 90 tablet, Rfl: 0   folic acid (FOLVITE) 1 MG tablet, Take 2 mg by mouth daily., Disp: , Rfl: 3   lansoprazole (PREVACID) 15 MG capsule, Take 15 mg by mouth daily at 12 noon., Disp: , Rfl:    levocetirizine (XYZAL) 5 MG tablet, Take 5 mg by mouth at bedtime. , Disp: , Rfl:    metoprolol succinate (TOPROL-XL) 50 MG 24 hr tablet, TAKE 1 TABLET BY MOUTH DAILY. TAKE WITH OR IMMEDIATELY FOLLOWING A MEAL., Disp: 90 tablet, Rfl: 2   simvastatin (ZOCOR) 10 MG tablet, TAKE 1 TABLET BY MOUTH EVERYDAY AT BEDTIME, Disp: 90 tablet, Rfl: 0   valACYclovir (VALTREX) 500 MG tablet, Take 500 mg by mouth daily., Disp: , Rfl:    TREMFYA 100 MG/ML SOPN, Inject into the skin., Disp: , Rfl:   Orders Placed This Encounter  Procedures   EKG 12-Lead   --Continue cardiac medications as reconciled in final medication list. --Return in about 1 year (around 01/16/2022) for Follow up Dyspena, CAC. . Or sooner if needed. --Continue follow-up with your primary care physician regarding the management of your other chronic comorbid conditions.  Patient's questions and concerns were addressed to her satisfaction. She voices understanding of the instructions provided during this encounter.   This note was created using a voice recognition software as a result there may be grammatical errors inadvertently enclosed that do not reflect the nature of this encounter. Every attempt is made to correct such errors.  Total time spent: 31 minutes  Mechele Claude [facility]  Pager: [phone] Office: [phone]

## 2021-01-20 DIAGNOSIS — I10 Essential (primary) hypertension: Secondary | ICD-10-CM | POA: Diagnosis not present

## 2021-01-20 DIAGNOSIS — Z6831 Body mass index (BMI) 31.0-31.9, adult: Secondary | ICD-10-CM | POA: Diagnosis not present

## 2021-01-23 ENCOUNTER — Other Ambulatory Visit: Payer: Self-pay | Admitting: Cardiology

## 2021-01-23 DIAGNOSIS — R0609 Other forms of dyspnea: Secondary | ICD-10-CM

## 2021-01-23 DIAGNOSIS — I10 Essential (primary) hypertension: Secondary | ICD-10-CM

## 2021-01-24 ENCOUNTER — Other Ambulatory Visit: Payer: Self-pay

## 2021-01-24 DIAGNOSIS — I2510 Atherosclerotic heart disease of native coronary artery without angina pectoris: Secondary | ICD-10-CM

## 2021-02-07 DIAGNOSIS — L403 Pustulosis palmaris et plantaris: Secondary | ICD-10-CM | POA: Diagnosis not present

## 2021-02-15 ENCOUNTER — Other Ambulatory Visit: Payer: Self-pay | Admitting: Cardiology

## 2021-02-26 ENCOUNTER — Other Ambulatory Visit: Payer: Self-pay | Admitting: Cardiology

## 2021-02-26 DIAGNOSIS — R0609 Other forms of dyspnea: Secondary | ICD-10-CM

## 2021-02-26 DIAGNOSIS — I10 Essential (primary) hypertension: Secondary | ICD-10-CM

## 2021-03-17 ENCOUNTER — Other Ambulatory Visit: Payer: Self-pay | Admitting: Cardiology

## 2021-03-17 DIAGNOSIS — I2510 Atherosclerotic heart disease of native coronary artery without angina pectoris: Secondary | ICD-10-CM

## 2021-03-21 DIAGNOSIS — L4059 Other psoriatic arthropathy: Secondary | ICD-10-CM | POA: Diagnosis not present

## 2021-03-21 DIAGNOSIS — L401 Generalized pustular psoriasis: Secondary | ICD-10-CM | POA: Diagnosis not present

## 2021-03-21 DIAGNOSIS — M0589 Other rheumatoid arthritis with rheumatoid factor of multiple sites: Secondary | ICD-10-CM | POA: Diagnosis not present

## 2021-03-21 DIAGNOSIS — R0602 Shortness of breath: Secondary | ICD-10-CM | POA: Diagnosis not present

## 2021-03-21 DIAGNOSIS — M5136 Other intervertebral disc degeneration, lumbar region: Secondary | ICD-10-CM | POA: Diagnosis not present

## 2021-03-21 DIAGNOSIS — Z6830 Body mass index (BMI) 30.0-30.9, adult: Secondary | ICD-10-CM | POA: Diagnosis not present

## 2021-03-21 DIAGNOSIS — M150 Primary generalized (osteo)arthritis: Secondary | ICD-10-CM | POA: Diagnosis not present

## 2021-03-21 DIAGNOSIS — E669 Obesity, unspecified: Secondary | ICD-10-CM | POA: Diagnosis not present

## 2021-03-21 DIAGNOSIS — M25512 Pain in left shoulder: Secondary | ICD-10-CM | POA: Diagnosis not present

## 2021-04-14 ENCOUNTER — Other Ambulatory Visit: Payer: Self-pay | Admitting: Cardiology

## 2021-04-14 DIAGNOSIS — E785 Hyperlipidemia, unspecified: Secondary | ICD-10-CM

## 2021-04-14 DIAGNOSIS — I2510 Atherosclerotic heart disease of native coronary artery without angina pectoris: Secondary | ICD-10-CM

## 2021-04-20 DIAGNOSIS — I10 Essential (primary) hypertension: Secondary | ICD-10-CM | POA: Diagnosis not present

## 2021-04-20 DIAGNOSIS — M0549 Rheumatoid myopathy with rheumatoid arthritis of multiple sites: Secondary | ICD-10-CM | POA: Diagnosis not present

## 2021-04-20 DIAGNOSIS — M0589 Other rheumatoid arthritis with rheumatoid factor of multiple sites: Secondary | ICD-10-CM | POA: Diagnosis not present

## 2021-04-20 DIAGNOSIS — F064 Anxiety disorder due to known physiological condition: Secondary | ICD-10-CM | POA: Diagnosis not present

## 2021-04-20 DIAGNOSIS — Z6831 Body mass index (BMI) 31.0-31.9, adult: Secondary | ICD-10-CM | POA: Diagnosis not present

## 2021-04-20 DIAGNOSIS — L4052 Psoriatic arthritis mutilans: Secondary | ICD-10-CM | POA: Diagnosis not present

## 2021-04-20 DIAGNOSIS — L905 Scar conditions and fibrosis of skin: Secondary | ICD-10-CM | POA: Diagnosis not present

## 2021-04-25 DIAGNOSIS — L403 Pustulosis palmaris et plantaris: Secondary | ICD-10-CM | POA: Diagnosis not present

## 2021-05-03 ENCOUNTER — Other Ambulatory Visit: Payer: Self-pay | Admitting: Cardiology

## 2021-05-13 ENCOUNTER — Other Ambulatory Visit: Payer: Self-pay | Admitting: Cardiology

## 2021-05-19 DIAGNOSIS — M0589 Other rheumatoid arthritis with rheumatoid factor of multiple sites: Secondary | ICD-10-CM | POA: Diagnosis not present

## 2021-05-19 DIAGNOSIS — E785 Hyperlipidemia, unspecified: Secondary | ICD-10-CM | POA: Diagnosis not present

## 2021-05-19 DIAGNOSIS — I10 Essential (primary) hypertension: Secondary | ICD-10-CM | POA: Diagnosis not present

## 2021-06-27 DIAGNOSIS — H01009 Unspecified blepharitis unspecified eye, unspecified eyelid: Secondary | ICD-10-CM | POA: Diagnosis not present

## 2021-06-27 DIAGNOSIS — H25813 Combined forms of age-related cataract, bilateral: Secondary | ICD-10-CM | POA: Diagnosis not present

## 2021-06-27 DIAGNOSIS — H04123 Dry eye syndrome of bilateral lacrimal glands: Secondary | ICD-10-CM | POA: Diagnosis not present

## 2021-06-28 DIAGNOSIS — L401 Generalized pustular psoriasis: Secondary | ICD-10-CM | POA: Diagnosis not present

## 2021-06-28 DIAGNOSIS — Z6830 Body mass index (BMI) 30.0-30.9, adult: Secondary | ICD-10-CM | POA: Diagnosis not present

## 2021-06-28 DIAGNOSIS — M25512 Pain in left shoulder: Secondary | ICD-10-CM | POA: Diagnosis not present

## 2021-06-28 DIAGNOSIS — R0602 Shortness of breath: Secondary | ICD-10-CM | POA: Diagnosis not present

## 2021-06-28 DIAGNOSIS — M1991 Primary osteoarthritis, unspecified site: Secondary | ICD-10-CM | POA: Diagnosis not present

## 2021-06-28 DIAGNOSIS — L4059 Other psoriatic arthropathy: Secondary | ICD-10-CM | POA: Diagnosis not present

## 2021-06-28 DIAGNOSIS — M5136 Other intervertebral disc degeneration, lumbar region: Secondary | ICD-10-CM | POA: Diagnosis not present

## 2021-06-28 DIAGNOSIS — M0589 Other rheumatoid arthritis with rheumatoid factor of multiple sites: Secondary | ICD-10-CM | POA: Diagnosis not present

## 2021-06-28 DIAGNOSIS — Z111 Encounter for screening for respiratory tuberculosis: Secondary | ICD-10-CM | POA: Diagnosis not present

## 2021-06-28 DIAGNOSIS — E669 Obesity, unspecified: Secondary | ICD-10-CM | POA: Diagnosis not present

## 2021-07-13 ENCOUNTER — Other Ambulatory Visit: Payer: Self-pay | Admitting: Cardiology

## 2021-07-13 DIAGNOSIS — E785 Hyperlipidemia, unspecified: Secondary | ICD-10-CM

## 2021-07-13 DIAGNOSIS — I2510 Atherosclerotic heart disease of native coronary artery without angina pectoris: Secondary | ICD-10-CM

## 2021-07-27 DIAGNOSIS — L400 Psoriasis vulgaris: Secondary | ICD-10-CM | POA: Diagnosis not present

## 2021-08-13 ENCOUNTER — Other Ambulatory Visit: Payer: Self-pay | Admitting: Cardiology

## 2021-08-21 DIAGNOSIS — Z0000 Encounter for general adult medical examination without abnormal findings: Secondary | ICD-10-CM | POA: Diagnosis not present

## 2021-08-21 DIAGNOSIS — Z6832 Body mass index (BMI) 32.0-32.9, adult: Secondary | ICD-10-CM | POA: Diagnosis not present

## 2021-08-21 DIAGNOSIS — I10 Essential (primary) hypertension: Secondary | ICD-10-CM | POA: Diagnosis not present

## 2021-08-21 DIAGNOSIS — L4059 Other psoriatic arthropathy: Secondary | ICD-10-CM | POA: Diagnosis not present

## 2021-08-21 DIAGNOSIS — M5135 Other intervertebral disc degeneration, thoracolumbar region: Secondary | ICD-10-CM | POA: Diagnosis not present

## 2021-09-06 ENCOUNTER — Other Ambulatory Visit: Payer: Self-pay | Admitting: Pulmonary Disease

## 2021-09-21 DIAGNOSIS — U071 COVID-19: Secondary | ICD-10-CM | POA: Diagnosis not present

## 2021-10-09 DIAGNOSIS — R21 Rash and other nonspecific skin eruption: Secondary | ICD-10-CM | POA: Diagnosis not present

## 2021-10-09 DIAGNOSIS — R0602 Shortness of breath: Secondary | ICD-10-CM | POA: Diagnosis not present

## 2021-10-09 DIAGNOSIS — Z91013 Allergy to seafood: Secondary | ICD-10-CM | POA: Diagnosis not present

## 2021-10-09 DIAGNOSIS — Z91010 Allergy to peanuts: Secondary | ICD-10-CM | POA: Diagnosis not present

## 2021-10-09 DIAGNOSIS — Z91018 Allergy to other foods: Secondary | ICD-10-CM | POA: Diagnosis not present

## 2021-10-12 ENCOUNTER — Other Ambulatory Visit: Payer: Self-pay | Admitting: Cardiology

## 2021-10-12 DIAGNOSIS — E785 Hyperlipidemia, unspecified: Secondary | ICD-10-CM

## 2021-10-12 DIAGNOSIS — I2510 Atherosclerotic heart disease of native coronary artery without angina pectoris: Secondary | ICD-10-CM

## 2021-10-18 DIAGNOSIS — L401 Generalized pustular psoriasis: Secondary | ICD-10-CM | POA: Diagnosis not present

## 2021-10-18 DIAGNOSIS — L4059 Other psoriatic arthropathy: Secondary | ICD-10-CM | POA: Diagnosis not present

## 2021-10-18 DIAGNOSIS — M0589 Other rheumatoid arthritis with rheumatoid factor of multiple sites: Secondary | ICD-10-CM | POA: Diagnosis not present

## 2021-10-18 DIAGNOSIS — Z6831 Body mass index (BMI) 31.0-31.9, adult: Secondary | ICD-10-CM | POA: Diagnosis not present

## 2021-10-18 DIAGNOSIS — M5136 Other intervertebral disc degeneration, lumbar region: Secondary | ICD-10-CM | POA: Diagnosis not present

## 2021-10-18 DIAGNOSIS — R0602 Shortness of breath: Secondary | ICD-10-CM | POA: Diagnosis not present

## 2021-10-18 DIAGNOSIS — E669 Obesity, unspecified: Secondary | ICD-10-CM | POA: Diagnosis not present

## 2021-10-18 DIAGNOSIS — M25512 Pain in left shoulder: Secondary | ICD-10-CM | POA: Diagnosis not present

## 2021-10-18 DIAGNOSIS — M1991 Primary osteoarthritis, unspecified site: Secondary | ICD-10-CM | POA: Diagnosis not present

## 2021-10-27 ENCOUNTER — Ambulatory Visit (INDEPENDENT_AMBULATORY_CARE_PROVIDER_SITE_OTHER): Payer: Medicare Other | Admitting: Pulmonary Disease

## 2021-10-27 ENCOUNTER — Encounter: Payer: Self-pay | Admitting: Pulmonary Disease

## 2021-10-27 VITALS — BP 116/60 | HR 109 | Temp 98.3°F | Ht 66.0 in | Wt 193.6 lb

## 2021-10-27 DIAGNOSIS — J4540 Moderate persistent asthma, uncomplicated: Secondary | ICD-10-CM

## 2021-10-27 DIAGNOSIS — I2510 Atherosclerotic heart disease of native coronary artery without angina pectoris: Secondary | ICD-10-CM | POA: Diagnosis not present

## 2021-10-27 DIAGNOSIS — J849 Interstitial pulmonary disease, unspecified: Secondary | ICD-10-CM | POA: Diagnosis not present

## 2021-10-27 NOTE — Progress Notes (Signed)
Cheyenne Gould    [id number]    [birth year]  Primary Care Physician:Bland, Adrian Saran, MD  Referring Physician: [name], MD [address] [place],  [place] [address]  Chief complaint: Follow up for asthma, dyspnea.  HPI: 76 year old with history of rheumatoid, psoriatic arthritis, pustular psoriasis, GERD, hypertension.  Evaluated in October 2020 for worsening dyspnea.  She had a high-res CT which showed mild centrilobular nodularity suggestive of hypersensitivity pneumonitis.  However ILD questionnare does not reveal any significant exposures.  Follow-up imaging in showed improvement in nodularity.  She follows with Dr. Dierdre Forth for arthritis.  She had been previously treated with [place] which had to be stopped due to side effects and Remicade which had to be stopped after she developed sepsis and pneumonia.  She also had been tried on [place] with no efficacy, then tried Simponi which was ineffective. Tried on Taltz injections from September to November 2021 but had to stop due to allergic reaction.  She is currently on Cimzia and methotrexate  She was evaluated by Dr. Jacinto Halim had an echo and stress test which are normal as per the patient   She has history of GERD and is on Prilosec.  Notices that dyspnea is better when she stops the Prilosec however has recurrence of heartburn when off Prilosec.  Has had a 30 pound weight gain over the past 1 year but feels that her dyspnea is not related to the weight gain.  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  Quit in 2005 Travel History: Lived in [place] for 5 years otherwise was a resident of [place].  No significant travel.  Interim history: Taken off methotrexate earlier this  year 2023 by Dr. Dierdre Forth and is currently on Rinvoq.  Her rheumatoid arthritis is under good control.  Breathing is also doing well.  She stopped using the inhalers and Singulair with no worsening of her breathing  No new complaints today  Outpatient Encounter Medications as of 10/27/2021  Medication Sig   acetaminophen (TYLENOL) 500 MG tablet Take 1,000 mg by mouth every 6 (six) hours as needed for mild pain or headache.   albuterol (PROAIR HFA) 108 (90 Base) MCG/ACT inhaler Inhale 2 puffs into the lungs every 6 (six) hours as needed for wheezing or shortness of breath.   amLODipine (NORVASC) 5 MG tablet Take 5 mg by mouth daily.   ASPIRIN LOW DOSE 81 MG EC tablet TAKE 1 TABLET (81 MG TOTAL) BY MOUTH DAILY. SWALLOW WHOLE.   azelastine (ASTELIN) 0.1 % nasal spray Place 2 sprays into both nostrils daily. Use in each nostril as directed   budesonide-formoterol (SYMBICORT) 160-4.5 MCG/ACT inhaler Inhale 2 puffs into the lungs 2 (two) times daily.   Cholecalciferol (VITAMIN D3) 2000 units TABS Take 2,000 Units by mouth daily.   clindamycin (CLEOCIN) 300 MG capsule Take 300 mg by mouth as needed (Pre dental procedures).   EPIPEN 2-PAK 0.3 MG/0.3ML SOAJ injection Inject 0.3 mg into the muscle as needed for anaphylaxis.   ezetimibe (ZETIA) 10 MG tablet TAKE 1 TABLET BY MOUTH EVERY DAY   folic acid (FOLVITE) 1 MG tablet Take 2 mg by mouth daily.   lansoprazole (PREVACID) 15 MG capsule  Take 15 mg by mouth daily at 12 noon.   levocetirizine (XYZAL) 5 MG tablet Take 5 mg by mouth at bedtime.    metoprolol succinate (TOPROL-XL) 50 MG 24 hr tablet TAKE 1 TABLET BY MOUTH DAILY. TAKE WITH OR IMMEDIATELY FOLLOWING A MEAL.   simvastatin (ZOCOR) 10 MG tablet TAKE 1 TABLET BY MOUTH EVERYDAY AT BEDTIME   Upadacitinib ER (RINVOQ) 15 MG TB24 1 tablet Orally Once a day for 30 day(s)   valACYclovir (VALTREX) 500 MG tablet Take 500 mg by mouth daily.   valsartan-hydrochlorothiazide (DIOVAN-HCT) 160-12.5 MG tablet Take  0.5 tablets by mouth daily.   [DISCONTINUED] TREMFYA 100 MG/ML SOPN Inject into the skin.   No facility-administered encounter medications on file as of 10/27/2021.   Physical Exam: Blood pressure 116/60, pulse (!) 109, temperature 98.3 F (36.8 C), temperature source Oral, height 5\' 6"  (1.676 m), weight 193 lb 9.6 oz (87.8 kg), SpO2 99 %. Gen:      No acute distress HEENT:  EOMI, sclera anicteric Neck:     No masses; no thyromegaly Lungs:    Clear to auscultation bilaterally; normal respiratory effort CV:         Regular rate and rhythm; no murmurs Abd:      + bowel sounds; soft, non-tender; no palpable masses, no distension Ext:    No edema; adequate peripheral perfusion Skin:      Warm and dry; no rash Neuro: alert and oriented x 3 Psych: normal mood and affect   Data Reviewed: Imaging Chest x-ray 06/10/15-bilateral lung infiltrates Chest x-ray 06/25/15-resolution of right upper lobe infiltrate and left basilar pneumonia.  There is linear atelectasis at the right base with right diaphragm elevation. High-resolution CT 06/05/2017- mild atrophy, no evidence of interstitial lung disease.  Subcentimeter pulmonary nodule, coronary atherosclerosis.  Mild scarring in the right middle, lower lobes CT chest 09/25/2017- stable subcentimeter pulmonary nodules, coronary atherosclerosis. High-resolution CT 12/19/2018-mild centrilobular nodularity, coronary artery atherosclerosis, aortic atherosclerosis CT chest 4/40/21-improvement in centrilobular nodularity.  High-resolution CT 03/11/2020-no interstitial lung disease, mild peripheral scarring in the periphery of the right lung base, aortic atherosclerosis, three-vessel coronary artery disease, nephrolithiasis.  I have reviewed the images personally.   PFTs 06/28/2017 FVC 1.76 [7%), FEV1 1.58 [78%], F/F 90, TLC 69%, DLCO 54%, DLCO/VA 83%  04/05/2020 FVC 2.02 [80%], FEV1 1.79 [92%], F/F 89, TLC 3.45 [64%], DLCO 14.10 [68%] Moderate restriction and  diffusion defect.  FENO 06/28/2017-9  ACT score 10/27/2021-24  Labs: CCP 01/21/2019-35,  Rheumatoid factor 01/21/19-25 ANA 01/21/2019-negative  CBC 01/21/2019-WBC 7, eos 4.7%, absolute eosinophil count 329 CBC 02/09/2020-WBC 6.6, eos 9.2%, absolute eosinophil count 607 IgE 02/09/2020-365   Assessment:  Asthma CT reviewed with mild centrilobular nodularity of unclear etiology.  Imaging is suggestive of hypersensitivity pneumonitis but she does not have any exposure history and follow-up imaging showed some improvement with no evidence of ILD She has history of rheumatoid arthritis so RA ILD is on the differential but imaging is not suggestive  Suspect she may have reactive airway disease or asthma given elevated peripheral eosinophils and IgE. PFTs reviewed with reduction in lung volumes secondary to body habitus given reduction in ERV.  She does have mild increase in flow rates suggestive of airway reactivity  Currently off all inhalers and clinically she is doing well.  We will continue to observe for now Follow-up high-res CT for evaluation of ILD in 6 months  Subcentimeter pulmonary nodule Stable and likely benign  Coronary atherosclerosis on CT scan Follows with  Dr. Jacinto Halim, cardiology  Plan/Recommendations: - Observe off inhalers - CT scan in 6 months  Chilton Greathouse MD [facility] 10/27/2021, 1:32 PM  CC: [name], MD

## 2021-10-27 NOTE — Patient Instructions (Signed)
I am glad you are doing well with your breathing and are off all inhalers We will get a high-resolution CT in 6 months for reevaluation of the lungs Return to clinic in 6 months after CT scan.

## 2021-10-30 ENCOUNTER — Other Ambulatory Visit: Payer: Self-pay | Admitting: Cardiology

## 2021-10-30 ENCOUNTER — Other Ambulatory Visit: Payer: Self-pay | Admitting: Pulmonary Disease

## 2021-10-30 DIAGNOSIS — I2510 Atherosclerotic heart disease of native coronary artery without angina pectoris: Secondary | ICD-10-CM

## 2021-10-30 DIAGNOSIS — E785 Hyperlipidemia, unspecified: Secondary | ICD-10-CM

## 2021-11-18 DIAGNOSIS — E785 Hyperlipidemia, unspecified: Secondary | ICD-10-CM | POA: Diagnosis not present

## 2021-11-18 DIAGNOSIS — I10 Essential (primary) hypertension: Secondary | ICD-10-CM | POA: Diagnosis not present

## 2021-12-22 DIAGNOSIS — E785 Hyperlipidemia, unspecified: Secondary | ICD-10-CM | POA: Diagnosis not present

## 2021-12-22 DIAGNOSIS — I129 Hypertensive chronic kidney disease with stage 1 through stage 4 chronic kidney disease, or unspecified chronic kidney disease: Secondary | ICD-10-CM | POA: Diagnosis not present

## 2021-12-22 DIAGNOSIS — D6489 Other specified anemias: Secondary | ICD-10-CM | POA: Diagnosis not present

## 2021-12-22 DIAGNOSIS — R635 Abnormal weight gain: Secondary | ICD-10-CM | POA: Diagnosis not present

## 2021-12-22 DIAGNOSIS — L4059 Other psoriatic arthropathy: Secondary | ICD-10-CM | POA: Diagnosis not present

## 2021-12-22 DIAGNOSIS — M05639 Rheumatoid arthritis of unspecified wrist with involvement of other organs and systems: Secondary | ICD-10-CM | POA: Diagnosis not present

## 2021-12-22 DIAGNOSIS — N189 Chronic kidney disease, unspecified: Secondary | ICD-10-CM | POA: Diagnosis not present

## 2021-12-22 DIAGNOSIS — Z6833 Body mass index (BMI) 33.0-33.9, adult: Secondary | ICD-10-CM | POA: Diagnosis not present

## 2021-12-22 DIAGNOSIS — E538 Deficiency of other specified B group vitamins: Secondary | ICD-10-CM | POA: Diagnosis not present

## 2021-12-28 DIAGNOSIS — H04123 Dry eye syndrome of bilateral lacrimal glands: Secondary | ICD-10-CM | POA: Diagnosis not present

## 2021-12-28 DIAGNOSIS — H25813 Combined forms of age-related cataract, bilateral: Secondary | ICD-10-CM | POA: Diagnosis not present

## 2021-12-28 DIAGNOSIS — H01009 Unspecified blepharitis unspecified eye, unspecified eyelid: Secondary | ICD-10-CM | POA: Diagnosis not present

## 2021-12-28 DIAGNOSIS — H524 Presbyopia: Secondary | ICD-10-CM | POA: Diagnosis not present

## 2022-01-02 DIAGNOSIS — Z1231 Encounter for screening mammogram for malignant neoplasm of breast: Secondary | ICD-10-CM | POA: Diagnosis not present

## 2022-01-16 ENCOUNTER — Other Ambulatory Visit (HOSPITAL_BASED_OUTPATIENT_CLINIC_OR_DEPARTMENT_OTHER): Payer: Self-pay

## 2022-01-16 ENCOUNTER — Ambulatory Visit: Payer: Medicare Other | Admitting: Cardiology

## 2022-01-16 MED ORDER — FLUAD QUADRIVALENT 0.5 ML IM PRSY
PREFILLED_SYRINGE | INTRAMUSCULAR | 0 refills | Status: DC
  Filled 2022-01-16: qty 0.5, 1d supply, fill #0

## 2022-01-16 MED ORDER — COMIRNATY 30 MCG/0.3ML IM SUSY
PREFILLED_SYRINGE | INTRAMUSCULAR | 0 refills | Status: DC
  Filled 2022-01-16: qty 0.3, 1d supply, fill #0

## 2022-01-25 ENCOUNTER — Ambulatory Visit: Payer: Federal, State, Local not specified - PPO | Admitting: Cardiology

## 2022-01-25 ENCOUNTER — Encounter: Payer: Self-pay | Admitting: Cardiology

## 2022-01-25 VITALS — BP 123/65 | HR 77 | Resp 16 | Ht 66.0 in | Wt 202.0 lb

## 2022-01-25 DIAGNOSIS — I10 Essential (primary) hypertension: Secondary | ICD-10-CM

## 2022-01-25 DIAGNOSIS — E785 Hyperlipidemia, unspecified: Secondary | ICD-10-CM | POA: Diagnosis not present

## 2022-01-25 DIAGNOSIS — I2510 Atherosclerotic heart disease of native coronary artery without angina pectoris: Secondary | ICD-10-CM | POA: Diagnosis not present

## 2022-01-25 DIAGNOSIS — E6609 Other obesity due to excess calories: Secondary | ICD-10-CM | POA: Diagnosis not present

## 2022-01-25 DIAGNOSIS — R0609 Other forms of dyspnea: Secondary | ICD-10-CM | POA: Diagnosis not present

## 2022-01-25 DIAGNOSIS — Z6832 Body mass index (BMI) 32.0-32.9, adult: Secondary | ICD-10-CM | POA: Diagnosis not present

## 2022-01-25 NOTE — Progress Notes (Signed)
[name] Date of Birth: 07/15/1945 MRN: [id number] Primary Care Provider:Bland, Adrian Saran, MD Former Cardiology Providers: Altamese [place], APRN, FNP-C, Dr. Yates Decamp Primary Cardiologist: Tessa Lerner, DO, [facility] (established care 07/23/2019)  Date: 01/25/22 Last Office Visit: 01/16/2022  Chief Complaint  Patient presents with   Follow-up    1 year Nonobstructive CAD and coronary artery calcification    HPI  Cheyenne Gould is a 76 y.o.  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  Patient was referred to the practice for evaluation and management of shortness of breath.  As per her prior ischemic workup she is noted to have nonobstructive CAD along with coronary artery calcification.  She is undergone appropriate ischemic workup as outlined below.  She presents today for 1 year follow-up visit.  She denies anginal discomfort or heart failure symptoms.  No hospitalizations or urgent care visits since last office encounter.  Given her dyspnea on exertion which is chronic and stable she did undergo pulmonary evaluation.  There was concern for possible pulmonary hypertension.  She did undergo right heart catheterization given her working diagnosis of interstitial lung disease and RA.  Based on the hemodynamics her PA pressures were within normal limits.  She has gained 11 pounds over the last year due to stress eating / snacking.  ALLERGIES: Allergies  Allergen Reactions   Omeprazole Shortness Of Breath   Peanut-Containing Drug Products Anaphylaxis   Shellfish Allergy Anaphylaxis   Ixekizumab Swelling    At injection site  Taltz   Penicillins Other (See Comments)    Corners of mouth started splitting  Reaction: 30 years   Sulfa Antibiotics Other  (See Comments)    Corners of mouth started splitting   Allegra [Fexofenadine] Cough   Leflunomide Rash   Lyrica [Pregabalin] Other (See Comments)    Causes rheumatoid flares     MEDICATION LIST PRIOR TO VISIT: Current Outpatient Medications on File Prior to Visit  Medication Sig Dispense Refill   acetaminophen (TYLENOL) 500 MG tablet Take 1,000 mg by mouth every 6 (six) hours as needed for mild pain or headache.     albuterol (PROAIR HFA) 108 (90 Base) MCG/ACT inhaler Inhale 2 puffs into the lungs every 6 (six) hours as needed for wheezing or shortness of breath. 1 Inhaler 3   amLODipine (NORVASC) 5 MG tablet Take 5 mg by mouth daily.     ASPIRIN LOW DOSE 81 MG EC tablet TAKE 1 TABLET (81 MG TOTAL) BY MOUTH DAILY. SWALLOW WHOLE. 90 tablet 3   azelastine (ASTELIN) 0.1 % nasal spray Place 2 sprays into both nostrils daily. Use in each nostril as directed     budesonide-formoterol (SYMBICORT) 160-4.5 MCG/ACT inhaler Inhale 2 puffs into the lungs 2 (two) times daily. 10.2 g 5   Cholecalciferol (VITAMIN D3) 2000 units TABS Take 2,000 Units by mouth daily.     COVID-19 mRNA vaccine 2023-2024 (COMIRNATY) syringe Inject into the muscle. 0.3 mL 0   EPIPEN 2-PAK 0.3 MG/0.3ML SOAJ injection Inject 0.3 mg into the muscle as needed for anaphylaxis.     ezetimibe (ZETIA) 10 MG tablet TAKE 1 TABLET BY MOUTH EVERY DAY 90 tablet 0   folic acid (FOLVITE) 1 MG tablet Take 2 mg by mouth daily.  3   influenza vaccine adjuvanted (FLUAD QUADRIVALENT) 0.5 ML injection Inject into the muscle. 0.5 mL  0   lansoprazole (PREVACID) 15 MG capsule Take 15 mg by mouth daily at 12 noon.     levocetirizine (XYZAL) 5 MG tablet Take 5 mg by mouth at bedtime.      metoprolol succinate (TOPROL-XL) 50 MG 24 hr tablet TAKE 1 TABLET BY MOUTH DAILY. TAKE WITH OR IMMEDIATELY FOLLOWING A MEAL. 90 tablet 2   montelukast (SINGULAIR) 10 MG tablet TAKE 1 TABLET BY MOUTH EVERYDAY AT BEDTIME 90 tablet 1   RESTASIS 0.05 % ophthalmic  emulsion Place 1 drop into both eyes 2 (two) times daily.     simvastatin (ZOCOR) 10 MG tablet TAKE 1 TABLET BY MOUTH EVERYDAY AT BEDTIME 90 tablet 0   Upadacitinib ER (RINVOQ) 15 MG TB24 1 tablet Orally Once a day for 30 day(s)     valACYclovir (VALTREX) 500 MG tablet Take 500 mg by mouth daily.     valsartan-hydrochlorothiazide (DIOVAN-HCT) 160-12.5 MG tablet Take 0.5 tablets by mouth daily.     clindamycin (CLEOCIN) 300 MG capsule Take 300 mg by mouth as needed (Pre dental procedures). (Patient not taking: Reported on 01/25/2022)     No current facility-administered medications on file prior to visit.    PAST MEDICAL HISTORY: Past Medical History:  Diagnosis Date   Acid reflux    AKI (acute kidney injury) ([place]) 06/07/2015   Allergic sinusitis    Anemia    CAP (community acquired pneumonia) 06/07/2015   Chest pain 01/01/2014   Coronary artery calcification    Hyperlipidemia    Hypertension    Hypoxia    Rheumatoid arthritis ([place])    on Leflunomide    Rheumatoid arthritis(714.0)    on Leflunomide    PAST SURGICAL HISTORY: Past Surgical History:  Procedure Laterality Date   ABDOMINAL HYSTERECTOMY  1985   Partial   BUNIONECTOMY  2002,12/18/2006   RIGHT/LEFT HEART CATH AND CORONARY ANGIOGRAPHY N/A 06/14/2020   Procedure: RIGHT/LEFT HEART CATH AND CORONARY ANGIOGRAPHY;  Surgeon: Adrian Prows, MD;  Location: [facility];  Service: Cardiovascular;  Laterality: N/A;    FAMILY HISTORY: The patient's family history includes Cancer in her brother; Heart disease in her father; Hypertension in her sister; Lupus in her brother.   SOCIAL HISTORY:  The patient  reports that she quit smoking about 18 years ago. Her smoking use included cigarettes. She has a 40.00 pack-year smoking history. She has never used smokeless tobacco. She reports that she does not drink alcohol and does not use drugs.  Review of Systems  Constitutional: Negative for decreased appetite, malaise/fatigue, weight  gain and weight loss.  Eyes:  Negative for visual disturbance.  Cardiovascular:  Positive for dyspnea on exertion (stable). Negative for chest pain, claudication, leg swelling, orthopnea, palpitations and syncope.  Respiratory:  Negative for hemoptysis and wheezing.   Endocrine: Negative for cold intolerance and heat intolerance.  Hematologic/Lymphatic: Does not bruise/bleed easily.  Skin:  Negative for nail changes.  Musculoskeletal:  Positive for back pain and joint pain. Negative for muscle weakness and myalgias.  Gastrointestinal:  Negative for abdominal pain, change in bowel habit, nausea and vomiting.  Neurological:  Negative for difficulty with concentration, dizziness, focal weakness and headaches.  Psychiatric/Behavioral:  Negative for altered mental status and suicidal ideas.   All other systems reviewed and are negative.   PHYSICAL EXAM:    01/25/2022    2:19 PM 10/27/2021    1:21 PM 01/16/2021   10:19 AM  Vitals with BMI  Height 5\' 6"  5\' 6"  5\' 6"   Weight 202 lbs 193 lbs 10 oz 191 lbs  BMI 32.62 [id number]  Systolic [id number]  Diastolic 65 60 69  Pulse 77 109 68    Physical Exam  Constitutional: No distress.  Age appropriate, hemodynamically stable.   Neck: No JVD present.  Cardiovascular: Normal rate, regular rhythm, S1 normal, S2 normal, intact distal pulses and normal pulses. Exam reveals no gallop, no S3 and no S4.  Murmur heard. Holosystolic murmur is present with a grade of 3/6 at the apex. Pulmonary/Chest: Effort normal and breath sounds normal. No stridor. She has no wheezes. She has no rales.  Abdominal: Soft. Bowel sounds are normal. She exhibits no distension. There is no abdominal tenderness.  Musculoskeletal:        General: No edema.     Cervical back: Neck supple.  Neurological: She is alert and oriented to person, place, and time. She has intact cranial nerves (2-12).  Skin: Skin is warm and moist.   CARDIAC DATABASE: CT of the chest  09/25/2017:   coronary atherosclerosis of LAD and left circumflex. Mild atherosclerotic [place] conditions of the aortic arch. Stable 6 mm nodule in right lower lobe likely benign.  EKG: 01/25/2022: Normal sinus rhythm, 75 bpm, frequent PACs, LAE, nonspecific T wave abnormality without underlying injury pattern.  No significant change compared to prior ECG dated 01/16/2021.  Echocardiogram: 05/04/2019: LVEF [id number] grade 1 diastolic impairment, normal left atrial pressure, mild AR, mild MR, mild TR, RVSP 29 mmHg, no pulmonary hypertension per echocardiogram.  Stress Testing:  Exercise myoview stress 04/26/2017: Exercised for 4 minutes and 50 seconds, achieved 4.6 METS, 97% of maximum predicted heart rate, low exercise capacity, stress ECG negative for ischemia. Stress and rest SPECT images demonstrate homogeneous tracer distribution throughout the myocardium. Gated SPECT imaging reveals normal myocardial thickening and wall motion.  LVEF 72%.  Low risk study.  Right and left heart catheterization 06/14/2020: Right heart catheterization: RA 1/4, mean -1 mmHg; RV 24/2, EDP 5 mmHg; PA 19/7, mean 13; PA saturation 76%.  PW 5/3, mean 2 mmHg; aortic saturation 100%. CO 7.42, CI 3.7 by Fick.  QP/QS 1.00. Left heart catheterization: LV: Normal LV systolic function without wall motion abnormality.  Normal EDP at 16 mmHg.  No pressure gradient across the aortic valve. Left main: Large vessel, mild coronary calcification noted. LAD: Severe extraluminal calcification noted on the proximal LAD.  Mid LAD has a 20 to 30% stenosis with moderate diffuse calcification.  Proximal large D1 has a 40 to 50% stenosis, again calcified. CX: Large caliber vessel.  Continues as a large OM 2.  Minimal luminal irregularity.  Mild calcification in the proximal segment. RI: Very small vessel with mild proximal calcification. RCA: Dominant, moderate size vessel, mild luminal irregularity, mild proximal coronary calcification.   Recommendation:  Patient's symptoms of dyspnea are probably noncardiac.  No significant coronary disease.  25 to 30 mL contrast utilized.  48-hour Holter monitor 11/20/2018: Normal sinus rhythm.  Rare PAC.  No reported symptoms.  LABORATORY DATA:    Latest Ref Rng & Units 06/14/2020   11:43 AM 06/14/2020   11:34 AM 06/14/2020   11:31 AM  CBC  Hemoglobin 12.0 - 15.0 g/dL 10.5  10.9  10.9   Hematocrit 36.0 - 46.0 % 31.0  32.0  32.0        Latest Ref Rng & Units 01/10/2021   11:12 AM 07/28/2020   10:34 AM 07/12/2020   10:59 AM  CMP  Glucose 70 - 99 mg/dL  90  58  102   BUN 8 - 27 mg/dL 16  25  20    Creatinine 0.57 - 1.00 mg/dL 1.34  1.53  1.54   Sodium 134 - 144 mmol/L 139  137  140   Potassium 3.5 - 5.2 mmol/L 4.6  4.1  4.3   Chloride 96 - 106 mmol/L 102  100  103   CO2 20 - 29 mmol/L 25  22  23    Calcium 8.7 - 10.3 mg/dL 9.6  9.4  10.1   Total Protein 6.0 - 8.5 g/dL 7.2     Total Bilirubin 0.0 - 1.2 mg/dL 0.4     Alkaline Phos 44 - 121 IU/L 137     AST 0 - 40 IU/L 22     ALT 0 - 32 IU/L 8       Lipid Panel  Lab Results  Component Value Date   CHOL 183 01/10/2021   HDL 69 01/10/2021   LDLCALC 103 (H) 01/10/2021   LDLDIRECT 98 01/10/2021   TRIG 58 01/10/2021   CHOLHDL 2.6 06/23/2019     Lab Results  Component Value Date   HGBA1C 5.8 (H) 06/08/2015   No components found for: "NTPROBNP" No results found for: "TSH"  Cardiac Panel (last 3 results) No results for input(s): "CKTOTAL", "CKMB", "TROPONINIHS", "RELINDX" in the last 72 hours.  IMPRESSION:    ICD-10-CM   1. Nonobstructive atherosclerosis of coronary artery  I25.10 PCV ECHOCARDIOGRAM COMPLETE    2. Coronary artery calcification seen on CT scan  I25.10 PCV ECHOCARDIOGRAM COMPLETE    3. Dyspnea on exertion  R06.09 EKG 12-Lead    PCV ECHOCARDIOGRAM COMPLETE    4. Benign hypertension  I10     5. Mild hyperlipidemia  E78.5     6. Class 1 obesity due to excess calories with serious comorbidity and body mass index (BMI) of  32.0 to 32.9 in adult  E66.09    Z68.32        RECOMMENDATIONS: Neeli Whitling is a 76 y.o. 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  Nonobstructive atherosclerosis of coronary artery / Coronary artery calcification seen on CT scan/dyspnea on exertion Denies anginal discomfort. Noted to have nonobstructive CAD based on the angiography from 2022. We emphasized the importance of secondary prevention. EKG: Nonischemic. No hospitalizations or urgent care visits over the last 1 year. Overall euvolemic and not in congestive heart failure. Recently had lipids checked with PCP. Currently on aspirin, simvastatin, Zetia. Patient states that her dyspnea on exertion is relatively stable over the last 1 year (differential diagnosis includes increased weight since the last office visit, working diagnosis of interstitial lung disease, history of smoking, etc.)  Benign hypertension Office blood pressures are very well-controlled. Medications reconciled. Reemphasized the importance of low-salt diet. No changes warranted at this time.  Mild hyperlipidemia Currently on simvastatin and Zetia.   She denies myalgia or other side effects. Recently checked by PCP, per patient.  Patient will try to obtain the labs and have them forwarded to the office for reference Currently managed by primary care provider.  Class 1 obesity due to excess calories with serious comorbidity and body mass index (BMI) of 32.0 to 32.9 in adult Body mass index is 32.6 kg/m. I reviewed with the patient the importance of diet, regular physical activity/exercise, weight loss.  Patient is educated on increasing physical activity gradually as tolerated.  With the goal of moderate intensity  exercise for 30 minutes a day 5 days a week.  FINAL MEDICATION LIST END OF ENCOUNTER: No orders of the defined types were placed in this encounter.    There are no discontinued medications.    Current Outpatient Medications:    acetaminophen (TYLENOL) 500 MG tablet, Take 1,000 mg by mouth every 6 (six) hours as needed for mild pain or headache., Disp: , Rfl:    albuterol (PROAIR HFA) 108 (90 Base) MCG/ACT inhaler, Inhale 2 puffs into the lungs every 6 (six) hours as needed for wheezing or shortness of breath., Disp: 1 Inhaler, Rfl: 3   amLODipine (NORVASC) 5 MG tablet, Take 5 mg by mouth daily., Disp: , Rfl:    ASPIRIN LOW DOSE 81 MG EC tablet, TAKE 1 TABLET (81 MG TOTAL) BY MOUTH DAILY. SWALLOW WHOLE., Disp: 90 tablet, Rfl: 3   azelastine (ASTELIN) 0.1 % nasal spray, Place 2 sprays into both nostrils daily. Use in each nostril as directed, Disp: , Rfl:    budesonide-formoterol (SYMBICORT) 160-4.5 MCG/ACT inhaler, Inhale 2 puffs into the lungs 2 (two) times daily., Disp: 10.2 g, Rfl: 5   Cholecalciferol (VITAMIN D3) 2000 units TABS, Take 2,000 Units by mouth daily., Disp: , Rfl:    COVID-19 mRNA vaccine 2023-2024 (COMIRNATY) syringe, Inject into the muscle., Disp: 0.3 mL, Rfl: 0   EPIPEN 2-PAK 0.3 MG/0.3ML SOAJ injection, Inject 0.3 mg into the muscle as needed for anaphylaxis., Disp: , Rfl:    ezetimibe (ZETIA) 10 MG tablet, TAKE 1 TABLET BY MOUTH EVERY DAY, Disp: 90 tablet, Rfl: 0   folic acid (FOLVITE) 1 MG tablet, Take 2 mg by mouth daily., Disp: , Rfl: 3   influenza vaccine adjuvanted (FLUAD QUADRIVALENT) 0.5 ML injection, Inject into the muscle., Disp: 0.5 mL, Rfl: 0   lansoprazole (PREVACID) 15 MG capsule, Take 15 mg by mouth daily at 12 noon., Disp: , Rfl:    levocetirizine (XYZAL) 5 MG tablet, Take 5 mg by mouth at bedtime. , Disp: , Rfl:    metoprolol succinate (TOPROL-XL) 50 MG 24 hr tablet, TAKE 1 TABLET BY MOUTH DAILY. TAKE WITH OR IMMEDIATELY FOLLOWING A MEAL., Disp: 90 tablet,  Rfl: 2   montelukast (SINGULAIR) 10 MG tablet, TAKE 1 TABLET BY MOUTH EVERYDAY AT BEDTIME, Disp: 90 tablet, Rfl: 1   RESTASIS 0.05 % ophthalmic emulsion, Place 1 drop into both eyes 2 (two) times daily., Disp: , Rfl:    simvastatin (ZOCOR) 10 MG tablet, TAKE 1 TABLET BY MOUTH EVERYDAY AT BEDTIME, Disp: 90 tablet, Rfl: 0   Upadacitinib ER (RINVOQ) 15 MG TB24, 1 tablet Orally Once a day for 30 day(s), Disp: , Rfl:    valACYclovir (VALTREX) 500 MG tablet, Take 500 mg by mouth daily., Disp: , Rfl:    valsartan-hydrochlorothiazide (DIOVAN-HCT) 160-12.5 MG tablet, Take 0.5 tablets by mouth daily., Disp: , Rfl:    clindamycin (CLEOCIN) 300 MG capsule, Take 300 mg by mouth as needed (Pre dental procedures). (Patient not taking: Reported on 01/25/2022), Disp: , Rfl:   Orders Placed This Encounter  Procedures   EKG 12-Lead   PCV ECHOCARDIOGRAM COMPLETE   --Continue cardiac medications as reconciled in final medication list. --Return in about 1 year (around 01/26/2023) for Follow up nonobstructive CAD. Or sooner if needed. --Continue follow-up with your primary care physician regarding the management of your other chronic comorbid conditions.  Patient's questions and concerns were addressed  to her satisfaction. She voices understanding of the instructions provided during this encounter.   This note was created using a voice recognition software as a result there may be grammatical errors inadvertently enclosed that do not reflect the nature of this encounter. Every attempt is made to correct such errors.  Rex Kras, [place], [facility]  Pager: [phone] Office: [phone]

## 2022-01-27 ENCOUNTER — Other Ambulatory Visit: Payer: Self-pay | Admitting: Cardiology

## 2022-02-08 DIAGNOSIS — E6609 Other obesity due to excess calories: Secondary | ICD-10-CM | POA: Diagnosis not present

## 2022-02-08 DIAGNOSIS — Z6833 Body mass index (BMI) 33.0-33.9, adult: Secondary | ICD-10-CM | POA: Diagnosis not present

## 2022-02-08 DIAGNOSIS — M549 Dorsalgia, unspecified: Secondary | ICD-10-CM | POA: Diagnosis not present

## 2022-02-09 ENCOUNTER — Ambulatory Visit
Admission: RE | Admit: 2022-02-09 | Discharge: 2022-02-09 | Disposition: A | Discharge status: Home / Self Care | Payer: Medicare Other | Source: Ambulatory Visit | Attending: Family Medicine | Admitting: Family Medicine

## 2022-02-09 ENCOUNTER — Other Ambulatory Visit: Payer: Self-pay | Admitting: Family Medicine

## 2022-02-09 DIAGNOSIS — M5489 Other dorsalgia: Secondary | ICD-10-CM

## 2022-02-21 DIAGNOSIS — M25512 Pain in left shoulder: Secondary | ICD-10-CM | POA: Diagnosis not present

## 2022-02-21 DIAGNOSIS — R0602 Shortness of breath: Secondary | ICD-10-CM | POA: Diagnosis not present

## 2022-02-21 DIAGNOSIS — L4059 Other psoriatic arthropathy: Secondary | ICD-10-CM | POA: Diagnosis not present

## 2022-02-21 DIAGNOSIS — E669 Obesity, unspecified: Secondary | ICD-10-CM | POA: Diagnosis not present

## 2022-02-21 DIAGNOSIS — M5136 Other intervertebral disc degeneration, lumbar region: Secondary | ICD-10-CM | POA: Diagnosis not present

## 2022-02-21 DIAGNOSIS — M1991 Primary osteoarthritis, unspecified site: Secondary | ICD-10-CM | POA: Diagnosis not present

## 2022-02-21 DIAGNOSIS — M0589 Other rheumatoid arthritis with rheumatoid factor of multiple sites: Secondary | ICD-10-CM | POA: Diagnosis not present

## 2022-02-21 DIAGNOSIS — Z6833 Body mass index (BMI) 33.0-33.9, adult: Secondary | ICD-10-CM | POA: Diagnosis not present

## 2022-02-21 DIAGNOSIS — L401 Generalized pustular psoriasis: Secondary | ICD-10-CM | POA: Diagnosis not present

## 2022-02-25 ENCOUNTER — Other Ambulatory Visit: Payer: Self-pay | Admitting: Cardiology

## 2022-02-25 DIAGNOSIS — I2510 Atherosclerotic heart disease of native coronary artery without angina pectoris: Secondary | ICD-10-CM

## 2022-02-26 DIAGNOSIS — H269 Unspecified cataract: Secondary | ICD-10-CM | POA: Diagnosis not present

## 2022-02-26 DIAGNOSIS — H25811 Combined forms of age-related cataract, right eye: Secondary | ICD-10-CM | POA: Diagnosis not present

## 2022-02-26 DIAGNOSIS — H25813 Combined forms of age-related cataract, bilateral: Secondary | ICD-10-CM | POA: Diagnosis not present

## 2022-03-12 DIAGNOSIS — H59021 Cataract (lens) fragments in eye following cataract surgery, right eye: Secondary | ICD-10-CM | POA: Diagnosis not present

## 2022-03-12 DIAGNOSIS — T8529XA Other mechanical complication of intraocular lens, initial encounter: Secondary | ICD-10-CM | POA: Diagnosis not present

## 2022-03-21 DIAGNOSIS — T8522XA Displacement of intraocular lens, initial encounter: Secondary | ICD-10-CM | POA: Diagnosis not present

## 2022-03-21 DIAGNOSIS — H59021 Cataract (lens) fragments in eye following cataract surgery, right eye: Secondary | ICD-10-CM | POA: Diagnosis not present

## 2022-03-21 DIAGNOSIS — H2701 Aphakia, right eye: Secondary | ICD-10-CM | POA: Diagnosis not present

## 2022-03-21 DIAGNOSIS — T8529XA Other mechanical complication of intraocular lens, initial encounter: Secondary | ICD-10-CM | POA: Diagnosis not present

## 2022-04-09 ENCOUNTER — Other Ambulatory Visit: Payer: Self-pay | Admitting: Cardiology

## 2022-04-09 DIAGNOSIS — E785 Hyperlipidemia, unspecified: Secondary | ICD-10-CM

## 2022-04-09 DIAGNOSIS — I2510 Atherosclerotic heart disease of native coronary artery without angina pectoris: Secondary | ICD-10-CM

## 2022-04-12 ENCOUNTER — Other Ambulatory Visit: Payer: Self-pay | Admitting: Cardiology

## 2022-04-27 ENCOUNTER — Other Ambulatory Visit: Payer: Self-pay | Admitting: Pulmonary Disease

## 2022-04-27 ENCOUNTER — Ambulatory Visit
Admission: RE | Admit: 2022-04-27 | Discharge: 2022-04-27 | Disposition: A | Discharge status: Home / Self Care | Payer: Medicare Other | Source: Ambulatory Visit | Attending: Pulmonary Disease | Admitting: Pulmonary Disease

## 2022-04-27 DIAGNOSIS — J849 Interstitial pulmonary disease, unspecified: Secondary | ICD-10-CM

## 2022-04-30 DIAGNOSIS — M79605 Pain in left leg: Secondary | ICD-10-CM | POA: Diagnosis not present

## 2022-05-01 ENCOUNTER — Other Ambulatory Visit (HOSPITAL_COMMUNITY): Payer: Self-pay | Admitting: Orthopedic Surgery

## 2022-05-01 DIAGNOSIS — M79605 Pain in left leg: Secondary | ICD-10-CM

## 2022-05-01 DIAGNOSIS — M25572 Pain in left ankle and joints of left foot: Secondary | ICD-10-CM | POA: Diagnosis not present

## 2022-05-01 DIAGNOSIS — M79662 Pain in left lower leg: Secondary | ICD-10-CM | POA: Diagnosis not present

## 2022-05-02 ENCOUNTER — Ambulatory Visit (HOSPITAL_COMMUNITY)
Admission: RE | Admit: 2022-05-02 | Discharge: 2022-05-02 | Disposition: A | Discharge status: Home / Self Care | Payer: Medicare Other | Source: Ambulatory Visit | Attending: Family Medicine | Admitting: Family Medicine

## 2022-05-02 DIAGNOSIS — M79605 Pain in left leg: Secondary | ICD-10-CM

## 2022-05-23 DIAGNOSIS — L4059 Other psoriatic arthropathy: Secondary | ICD-10-CM | POA: Diagnosis not present

## 2022-05-30 DIAGNOSIS — H35351 Cystoid macular degeneration, right eye: Secondary | ICD-10-CM | POA: Diagnosis not present

## 2022-06-01 ENCOUNTER — Ambulatory Visit (INDEPENDENT_AMBULATORY_CARE_PROVIDER_SITE_OTHER): Payer: Medicare Other | Admitting: Pulmonary Disease

## 2022-06-01 ENCOUNTER — Encounter: Payer: Self-pay | Admitting: Pulmonary Disease

## 2022-06-01 VITALS — BP 120/72 | HR 65 | Ht 66.0 in | Wt 202.0 lb

## 2022-06-01 DIAGNOSIS — R0609 Other forms of dyspnea: Secondary | ICD-10-CM | POA: Diagnosis not present

## 2022-06-01 NOTE — Progress Notes (Addendum)
 Cheyenne Gould    [id number]    [birth year]  Primary Care Physician:Bland, Samara Crest, MD  Referring Physician: Jonathon Neighbors, MD [address] [facility],  [place] [address]  Chief complaint: Follow up for asthma, dyspnea.  HPI: 77 y.o. with history of rheumatoid, psoriatic arthritis, pustular psoriasis, GERD, hypertension.  Evaluated in October 2020 for worsening dyspnea.  She had a high-res CT which showed mild centrilobular nodularity suggestive of hypersensitivity pneumonitis.  However ILD questionnare does not reveal any significant exposures.  Follow-up imaging in showed improvement in nodularity.  She follows with Dr. Ebbie Goldmann for arthritis.  She had been previously treated with Arava  which had to be stopped due to side effects and Remicade  which had to be stopped after she developed sepsis and pneumonia.  She also had been tried on Otesla, stelara  with no efficacy, then tried Simponi  which was ineffective. Tried on Taltz injections from September to November 2021 but had to stop due to allergic reaction.  She is currently on Cimzia  and methotrexate  She was evaluated by Dr. Berry Bristol had an echo and stress test which are normal as per the patient   She has history of GERD and is on Prilosec.  Notices that dyspnea is better when she stops the Prilosec however has recurrence of heartburn when off Prilosec.  Has had a 30 pound weight gain over the past 1 year but feels that her dyspnea is not related to the weight gain.  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  Quit in 2005 Travel History: Lived in [place]  for 5 years otherwise was a resident of [place] .  No significant travel.  Interim history: Taken off methotrexate earlier this year  2022 by Dr. Ebbie Goldmann and is currently on Rinvoq.  Her rheumatoid arthritis is under good control. Breathing is also doing well.  She stopped using the inhalers and Singulair  in 2022 with no worsening of her breathing  No new complaints today  Outpatient Encounter Medications as of 06/01/2022  Medication Sig   acetaminophen  (TYLENOL ) 500 MG tablet Take 1,000 mg by mouth every 6 (six) hours as needed for mild pain or headache.   albuterol  (PROAIR  HFA) 108 (90 Base) MCG/ACT inhaler Inhale 2 puffs into the lungs every 6 (six) hours as needed for wheezing or shortness of breath.   amLODipine  (NORVASC ) 5 MG tablet Take 5 mg by mouth daily.   azelastine (ASTELIN) 0.1 % nasal spray Place 2 sprays into both nostrils daily. Use in each nostril as directed   budesonide -formoterol  (SYMBICORT ) 160-4.5 MCG/ACT inhaler Inhale 2 puffs into the lungs 2 (two) times daily.   Cholecalciferol (VITAMIN D3) 2000 units TABS Take 2,000 Units by mouth daily.   clindamycin  (CLEOCIN ) 300 MG capsule Take 300 mg by mouth as needed (Pre dental procedures).   COVID-19 mRNA vaccine 2023-2024 (COMIRNATY ) syringe Inject into the muscle.   [employer] ASPIRIN  LOW DOSE 81 MG tablet TAKE 1 TABLET (81 MG TOTAL) BY MOUTH DAILY. SWALLOW WHOLE.   EPIPEN 2-PAK 0.3 MG/0.3ML SOAJ injection Inject 0.3 mg into the muscle as needed for anaphylaxis.   ezetimibe  (ZETIA ) 10 MG tablet TAKE 1 TABLET BY MOUTH EVERY DAY   folic acid  (FOLVITE ) 1 MG  tablet Take 2 mg by mouth daily.   influenza vaccine adjuvanted (FLUAD  QUADRIVALENT) 0.5 ML injection Inject into the muscle.   lansoprazole (PREVACID) 15 MG capsule Take 15 mg by mouth daily at 12 noon.   levocetirizine (XYZAL ) 5 MG tablet Take 5 mg by mouth at bedtime.    metoprolol  succinate (TOPROL -XL) 50 MG 24 hr tablet TAKE 1 TABLET BY MOUTH EVERY DAY WITH OR IMMEDIATELY FOLLOWING A MEAL   montelukast  (SINGULAIR ) 10 MG tablet TAKE 1 TABLET BY MOUTH EVERYDAY AT BEDTIME   PROLENSA 0.07 % SOLN Apply to eye.    simvastatin  (ZOCOR ) 10 MG tablet TAKE 1 TABLET BY MOUTH EVERYDAY AT BEDTIME   Upadacitinib ER (RINVOQ) 15 MG TB24 1 tablet Orally Once a day for 30 day(s)   valACYclovir  (VALTREX ) 500 MG tablet Take 500 mg by mouth daily.   valsartan -hydrochlorothiazide  (DIOVAN -HCT) 160-12.5 MG tablet Take 0.5 tablets by mouth daily.   XIIDRA 5 % SOLN Apply 1 drop to eye 2 (two) times daily.   [DISCONTINUED] RESTASIS 0.05 % ophthalmic emulsion Place 1 drop into both eyes 2 (two) times daily.   No facility-administered encounter medications on file as of 06/01/2022.   Physical Exam: Blood pressure 120/72, pulse 65, height 5' 6 (1.676 m), weight 202 lb (91.6 kg), SpO2 98 %. Gen:      No acute distress HEENT:  EOMI, sclera anicteric Neck:     No masses; no thyromegaly Lungs:    Clear to auscultation bilaterally; normal respiratory effort CV:         Regular rate and rhythm; no murmurs Abd:      + bowel sounds; soft, non-tender; no palpable masses, no distension Ext:    No edema; adequate peripheral perfusion Skin:      Warm and dry; no rash Neuro: alert and oriented x 3 Psych: normal mood and affect   Data Reviewed: Imaging Chest x-ray 06/10/15-bilateral lung infiltrates Chest x-ray 06/25/15-resolution of right upper lobe infiltrate and left basilar pneumonia.  There is linear atelectasis at the right base with right diaphragm elevation. High-resolution CT 06/05/2017- mild atrophy, no evidence of interstitial lung disease.  Subcentimeter pulmonary nodule, coronary atherosclerosis.  Mild scarring in the right middle, lower lobes CT chest 09/25/2017- stable subcentimeter pulmonary nodules, coronary atherosclerosis. High-resolution CT 12/19/2018-mild centrilobular nodularity, coronary artery atherosclerosis, aortic atherosclerosis CT chest 4/40/21-improvement in centrilobular nodularity.  High-resolution CT 03/11/2020-no interstitial lung disease, mild peripheral scarring in the periphery of the right lung base,  aortic atherosclerosis, three-vessel coronary artery disease, nephrolithiasis.   High resolution CT 04/29/2022-minimal bland scarring which is unchanged, lobular air trapping, coronary artery disease I reviewed the images personally.   PFTs 06/28/2017 FVC 1.76 [7%), FEV1 1.58 [78%], F/F 90, TLC 69%, DLCO 54%, DLCO/VA 83%  04/05/2020 FVC 2.02 [80%], FEV1 1.79 [92%], F/F 89, TLC 3.45 [64%], DLCO 14.10 [68%] Moderate restriction and diffusion defect.  FENO 06/28/2017-9  ACT score 10/27/2021-24  Labs: CCP 01/21/2019-35,  Rheumatoid factor 01/21/19-25 ANA 01/21/2019-negative  CBC 01/21/2019-WBC 7, eos 4.7%, absolute eosinophil count 329 CBC 02/09/2020-WBC 6.6, eos 9.2%, absolute eosinophil count 607 IgE 02/09/2020-365  Cardiac: Right and left heart catheterization 05/25/2020 No pulmonary hypertension, nonobstructive coronary artery disease  Assessment:  Asthma Suspect she may have reactive airway disease or asthma given elevated peripheral eosinophils and IgE. PFTs reviewed with reduction in lung volumes secondary to body habitus given reduction in ERV.  She does have mild increase in flow rates suggestive of airway reactivity  Currently off all inhalers and clinically  she is doing well.  She has a Symbicort  prescription but hardly needs to use it.  We will continue to observe for now  Concern for interstitial lung disease CT in the past reviewed with mild centrilobular nodularity of unclear etiology.  Imaging is suggestive of hypersensitivity pneumonitis but she does not have any exposure history and follow-up imaging showed some improvement with no evidence of ILD She has history of rheumatoid arthritis so RA ILD is on the differential but imaging is not suggestive  Latest CT from this year reviewed with no evidence of interstitial lung disease.  She can follow-up with us  as needed  Subcentimeter pulmonary nodule Stable and likely benign  Coronary atherosclerosis on CT scan Follows  with Dr. Berry Bristol, cardiology  Plan/Recommendations: - Observe off inhalers - Return to clinic as needed  Betzayda Braxton MD [facility] 06/01/2022, 10:41 AM  CC: Jonathon Neighbors, MD  Addendum: Received note from Dr. Kirtland Perfect, rheumatology dated 07/23/2023 She is being followed for inflammatory arthritis.  Rheumatoid arthritis, psoriatic arthritis overlap.  Initially diagnosed as rheumatoid arthritis with positive CCP.  Also developed psoriasis and psoriatic arthritis in 2017.  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    Currently on Rinvoq.  Developed superficial vein thrombosis in 2024 while on Rinvoq but okay to continue current therapy.

## 2022-06-01 NOTE — Patient Instructions (Signed)
I am glad you are doing well with your breathing The CT scan looks normal Since you are doing well and not needing any inhalers she can follow-up in clinic as needed.

## 2022-06-05 ENCOUNTER — Other Ambulatory Visit (HOSPITAL_COMMUNITY): Payer: Self-pay | Admitting: Family Medicine

## 2022-06-05 DIAGNOSIS — M79605 Pain in left leg: Secondary | ICD-10-CM

## 2022-06-05 DIAGNOSIS — R2243 Localized swelling, mass and lump, lower limb, bilateral: Secondary | ICD-10-CM | POA: Diagnosis not present

## 2022-06-06 ENCOUNTER — Ambulatory Visit (HOSPITAL_COMMUNITY)
Admission: RE | Admit: 2022-06-06 | Discharge: 2022-06-06 | Disposition: A | Discharge status: Home / Self Care | Payer: Medicare Other | Source: Ambulatory Visit | Attending: Family Medicine | Admitting: Family Medicine

## 2022-06-06 DIAGNOSIS — M79662 Pain in left lower leg: Secondary | ICD-10-CM

## 2022-06-06 DIAGNOSIS — M79605 Pain in left leg: Secondary | ICD-10-CM | POA: Diagnosis not present

## 2022-06-06 NOTE — Progress Notes (Signed)
Left lower extremity venous study completed.   Preliminary results relayed to [facility].  Please see CV Procedures for preliminary results.  Chamille Werntz, RVT  11:06 AM 06/06/22

## 2022-06-21 DIAGNOSIS — H35351 Cystoid macular degeneration, right eye: Secondary | ICD-10-CM | POA: Diagnosis not present

## 2022-06-26 DIAGNOSIS — I10 Essential (primary) hypertension: Secondary | ICD-10-CM | POA: Diagnosis not present

## 2022-06-26 DIAGNOSIS — E669 Obesity, unspecified: Secondary | ICD-10-CM | POA: Diagnosis not present

## 2022-06-26 DIAGNOSIS — I82812 Embolism and thrombosis of superficial veins of left lower extremities: Secondary | ICD-10-CM | POA: Diagnosis not present

## 2022-06-26 DIAGNOSIS — I129 Hypertensive chronic kidney disease with stage 1 through stage 4 chronic kidney disease, or unspecified chronic kidney disease: Secondary | ICD-10-CM | POA: Diagnosis not present

## 2022-07-03 ENCOUNTER — Other Ambulatory Visit: Payer: Self-pay | Admitting: Cardiology

## 2022-07-03 ENCOUNTER — Inpatient Hospital Stay: Payer: Medicare Other | Attending: Hematology and Oncology | Admitting: Hematology and Oncology

## 2022-07-03 ENCOUNTER — Inpatient Hospital Stay: Payer: Medicare Other

## 2022-07-03 ENCOUNTER — Other Ambulatory Visit: Payer: Self-pay

## 2022-07-03 ENCOUNTER — Encounter: Payer: Self-pay | Admitting: Hematology and Oncology

## 2022-07-03 VITALS — BP 128/59 | HR 76 | Temp 98.0°F | Resp 18 | Ht 66.0 in | Wt 209.2 lb

## 2022-07-03 DIAGNOSIS — Z7951 Long term (current) use of inhaled steroids: Secondary | ICD-10-CM | POA: Diagnosis not present

## 2022-07-03 DIAGNOSIS — K219 Gastro-esophageal reflux disease without esophagitis: Secondary | ICD-10-CM | POA: Insufficient documentation

## 2022-07-03 DIAGNOSIS — Z800 Family history of malignant neoplasm of digestive organs: Secondary | ICD-10-CM | POA: Diagnosis not present

## 2022-07-03 DIAGNOSIS — Z79899 Other long term (current) drug therapy: Secondary | ICD-10-CM | POA: Insufficient documentation

## 2022-07-03 DIAGNOSIS — Z7982 Long term (current) use of aspirin: Secondary | ICD-10-CM | POA: Insufficient documentation

## 2022-07-03 DIAGNOSIS — E785 Hyperlipidemia, unspecified: Secondary | ICD-10-CM | POA: Insufficient documentation

## 2022-07-03 DIAGNOSIS — M069 Rheumatoid arthritis, unspecified: Secondary | ICD-10-CM | POA: Diagnosis not present

## 2022-07-03 DIAGNOSIS — Z79624 Long term (current) use of inhibitors of nucleotide synthesis: Secondary | ICD-10-CM | POA: Diagnosis not present

## 2022-07-03 DIAGNOSIS — I2510 Atherosclerotic heart disease of native coronary artery without angina pectoris: Secondary | ICD-10-CM | POA: Diagnosis not present

## 2022-07-03 DIAGNOSIS — Z87891 Personal history of nicotine dependence: Secondary | ICD-10-CM | POA: Diagnosis not present

## 2022-07-03 DIAGNOSIS — M7989 Other specified soft tissue disorders: Secondary | ICD-10-CM | POA: Insufficient documentation

## 2022-07-03 DIAGNOSIS — I82812 Embolism and thrombosis of superficial veins of left lower extremities: Secondary | ICD-10-CM

## 2022-07-03 DIAGNOSIS — I10 Essential (primary) hypertension: Secondary | ICD-10-CM | POA: Insufficient documentation

## 2022-07-03 DIAGNOSIS — Z86718 Personal history of other venous thrombosis and embolism: Secondary | ICD-10-CM | POA: Diagnosis not present

## 2022-07-03 DIAGNOSIS — Z8616 Personal history of COVID-19: Secondary | ICD-10-CM | POA: Insufficient documentation

## 2022-07-03 NOTE — Progress Notes (Signed)
[facility] CONSULT NOTE  Patient Care Team: [name], MD as PCP - General (Family Medicine) Jeani Hawking, MD (Gastroenterology) Syliva Overman, MD as Attending Physician (Internal Medicine)   ASSESSMENT & PLAN:  Acute superficial venous thrombosis of left lower extremity She does not need anticoagulation for this I suspect it was triggered by recent trauma from ill fitting shoe, reduced mobility and dehydration She had numerous challenges in the past including 3 pregnancies, multiple surgeries, birth control pills as well as hormone replacement therapy and smoking and never developed DVT She does not need long-term follow-up or extensive workup I reinforced the importance of adequate hydration, exercise as tolerated and to continue taking 81 mg aspirin  No orders of the defined types were placed in this encounter.   All questions were answered. The patient knows to call the clinic with any problems, questions or concerns. The total time spent in the appointment was 55 minutes encounter with patients including review of chart and various tests results, discussions about plan of care and coordination of care plan  Artis Delay, MD 5/14/20242:08 PM  CHIEF COMPLAINTS/PURPOSE OF CONSULTATION:  Acute left lower extremity superficial venous thrombosis  HISTORY OF PRESENTING ILLNESS:  Cheyenne Gould 77 y.o. female is here because of recent diagnosis of acute left lower extremity superficial venous thrombosis She noticed discomfort and pain on the left lower extremity several months ago after trying out an ill fitting shoe It hurt her arches The very next day, she developed left lower extremity pain, discomfort and mild swelling She had evaluation with venous Doppler ultrasound On March 13 th, 2024, Venous Doppler US showed RIGHT:  - No evidence of common femoral vein obstruction.    LEFT:  - Findings consistent with acute superficial vein thrombosis involving the  left great saphenous vein (mid calf to ankle).  - There is no evidence of deep vein thrombosis in the lower extremity.  - No cystic structure found in the popliteal fossa.   She was recommended to take ibuprofen, hot compress and her symptoms subsequently improved She is still has intermittent aches and swelling of her left lower extremity.  Recently, she had repeat evaluation  On June 06, 2022, repeat venous Doppler ultrasound showed RIGHT:  - No evidence of common femoral vein obstruction.    LEFT:  - Findings consistent with age indeterminate superficial vein thrombosis involving the left great saphenous vein (segment in mid calf and segment at the ankle).  - Findings appear improved from previous examination.  - There is no evidence of deep vein thrombosis in the lower extremity.  She denies recent history of trauma, long distance travel, dehydration, recent surgery, smoking or prolonged immobilization. She had no prior history or diagnosis of cancer. Her age appropriate screening programs are up-to-date. She had prior surgeries before and never had perioperative thromboembolic events. The patient had been exposed to birth control pills for over 10 years as well as hormone replacement therapy for over 20 years and never had thrombotic events. The patient had been pregnant before and denies history of peripartum thromboembolic event or history of recurrent miscarriages. There is no family history of blood clots or miscarriages. She also have significant past history of smoking for 40 years before she quit in 2005.  She had surgeries before and never developed post surgical thrombotic events At baseline, she drinks approximately 60 ounces of fluid.  She walks a few days a week  MEDICAL HISTORY:  Past Medical History:  Diagnosis Date  Acid reflux    AKI (acute kidney injury) (HCC) 06/07/2015   Allergic sinusitis    Anemia    CAP (community acquired pneumonia) 06/07/2015   Chest pain  01/01/2014   Coronary artery calcification    Hyperlipidemia    Hypertension    Hypoxia    Rheumatoid arthritis (HCC)    on Leflunomide    Rheumatoid arthritis(714.0)    on Leflunomide    SURGICAL HISTORY: Past Surgical History:  Procedure Laterality Date   ABDOMINAL HYSTERECTOMY  1985   Partial   BUNIONECTOMY  2002,12/18/2006   RIGHT/LEFT HEART CATH AND CORONARY ANGIOGRAPHY N/A 06/14/2020   Procedure: RIGHT/LEFT HEART CATH AND CORONARY ANGIOGRAPHY;  Surgeon: Yates Decamp, MD;  Location: [facility];  Service: Cardiovascular;  Laterality: N/A;    SOCIAL HISTORY: Social History   Socioeconomic History   Marital status: Divorced    Spouse name: Not on file   Number of children: 3   Years of education: Not on file   Highest education level: Not on file  Occupational History   Not on file  Tobacco Use   Smoking status: Former    Packs/day: 1.00    Years: 40.00    Additional pack years: 0.00    Total pack years: 40.00    Types: Cigarettes    Quit date: 02/20/2003    Years since quitting: 19.3   Smokeless tobacco: Never  Vaping Use   Vaping Use: Never used  Substance and Sexual Activity   Alcohol use: No   Drug use: No   Sexual activity: Not on file  Other Topics Concern   Not on file  Social History Narrative   Not on file   Social Determinants of Health   Financial Resource Strain: Not on file  Food Insecurity: Not on file  Transportation Needs: Not on file  Physical Activity: Not on file  Stress: Not on file  Social Connections: Not on file  Intimate Partner Violence: Not on file    FAMILY HISTORY: Family History  Problem Relation Age of Onset   Heart disease Father    Hypertension Sister    Lupus Brother    Cancer Brother        Colon    ALLERGIES:  is allergic to omeprazole, peanut-containing drug products, shellfish allergy, ixekizumab, penicillins, sulfa antibiotics, allegra [fexofenadine], leflunomide, and lyrica  [pregabalin].  MEDICATIONS:  Current Outpatient Medications  Medication Sig Dispense Refill   acetaminophen (TYLENOL) 500 MG tablet Take 1,000 mg by mouth every 6 (six) hours as needed for mild pain or headache.     albuterol (PROAIR HFA) 108 (90 Base) MCG/ACT inhaler Inhale 2 puffs into the lungs every 6 (six) hours as needed for wheezing or shortness of breath. 1 Inhaler 3   amLODipine (NORVASC) 5 MG tablet Take 5 mg by mouth daily.     azelastine (ASTELIN) 0.1 % nasal spray Place 2 sprays into both nostrils daily. Use in each nostril as directed     budesonide-formoterol (SYMBICORT) 160-4.5 MCG/ACT inhaler Inhale 2 puffs into the lungs 2 (two) times daily. 10.2 g 5   Cholecalciferol (VITAMIN D3) 2000 units TABS Take 2,000 Units by mouth daily.     clindamycin (CLEOCIN) 300 MG capsule Take 300 mg by mouth as needed (Pre dental procedures).     COVID-19 mRNA vaccine 2023-2024 (COMIRNATY) syringe Inject into the muscle. 0.3 mL 0   CVS ASPIRIN LOW DOSE 81 MG tablet TAKE 1 TABLET (81 MG TOTAL) BY MOUTH DAILY.  SWALLOW WHOLE. 90 tablet 3   EPIPEN 2-PAK 0.3 MG/0.3ML SOAJ injection Inject 0.3 mg into the muscle as needed for anaphylaxis.     ezetimibe (ZETIA) 10 MG tablet TAKE 1 TABLET BY MOUTH EVERY DAY 90 tablet 0   folic acid (FOLVITE) 1 MG tablet Take 2 mg by mouth daily.  3   influenza vaccine adjuvanted (FLUAD QUADRIVALENT) 0.5 ML injection Inject into the muscle. 0.5 mL 0   lansoprazole (PREVACID) 15 MG capsule Take 15 mg by mouth daily at 12 noon.     levocetirizine (XYZAL) 5 MG tablet Take 5 mg by mouth at bedtime.      metoprolol succinate (TOPROL-XL) 50 MG 24 hr tablet TAKE 1 TABLET BY MOUTH EVERY DAY WITH OR IMMEDIATELY FOLLOWING A MEAL 90 tablet 2   montelukast (SINGULAIR) 10 MG tablet TAKE 1 TABLET BY MOUTH EVERYDAY AT BEDTIME 90 tablet 1   PROLENSA 0.07 % SOLN Apply to eye.     simvastatin (ZOCOR) 10 MG tablet TAKE 1 TABLET BY MOUTH EVERYDAY AT BEDTIME 90 tablet 3   Upadacitinib ER  (RINVOQ) 15 MG TB24 1 tablet Orally Once a day for 30 day(s)     valACYclovir (VALTREX) 500 MG tablet Take 500 mg by mouth daily.     valsartan-hydrochlorothiazide (DIOVAN-HCT) 160-12.5 MG tablet Take 0.5 tablets by mouth daily.     XIIDRA 5 % SOLN Apply 1 drop to eye 2 (two) times daily.     No current facility-administered medications for this visit.    REVIEW OF SYSTEMS:   Constitutional: Denies fevers, chills or abnormal night sweats Eyes: Denies blurriness of vision, double vision or watery eyes Ears, nose, mouth, throat, and face: Denies mucositis or sore throat Respiratory: Denies cough, dyspnea or wheezes Cardiovascular: Denies palpitation, chest discomfort  Gastrointestinal:  Denies nausea, heartburn or change in bowel habits Skin: Denies abnormal skin rashes Lymphatics: Denies new lymphadenopathy or easy bruising Neurological:Denies numbness, tingling or new weaknesses Behavioral/Psych: Mood is stable, no new changes  All other systems were reviewed with the patient and are negative.  PHYSICAL EXAMINATION: ECOG PERFORMANCE STATUS: 1 - Symptomatic but completely ambulatory  Vitals:   07/03/22 1253  BP: (!) 128/59  Pulse: 76  Resp: 18  Temp: 98 F (36.7 C)  SpO2: 100%   Filed Weights   07/03/22 1253  Weight: 209 lb 3.2 oz (94.9 kg)    GENERAL:alert, no distress and comfortable SKIN: skin color, texture, turgor are normal, no rashes or significant lesions EYES: normal, conjunctiva are pink and non-injected, sclera clear OROPHARYNX:no exudate, no erythema and lips, buccal mucosa, and tongue normal  NECK: supple, thyroid normal size, non-tender, without nodularity LYMPH:  no palpable lymphadenopathy in the cervical, axillary or inguinal LUNGS: clear to auscultation and percussion with normal breathing effort HEART: regular rate & rhythm and no murmurs and no lower extremity edema.  Noted varicose vein and discomfort on palpation on the left lower  extremity ABDOMEN:abdomen soft, non-tender and normal bowel sounds Musculoskeletal:no cyanosis of digits and no clubbing  PSYCH: alert & oriented x 3 with fluent speech NEURO: no focal motor/sensory deficits  LABORATORY DATA:  I have reviewed the data as listed Lab Results  Component Value Date   WBC 6.1 06/13/2020   HGB 10.5 (L) 06/14/2020   HCT 31.0 (L) 06/14/2020   MCV 91 06/13/2020   PLT 350 06/13/2020    RADIOGRAPHIC STUDIES: I have personally reviewed the radiological images as listed and agreed with the findings in the report.  VAS [place] LOWER EXTREMITY VENOUS (DVT)  Result Date: 06/06/2022  Lower Venous DVT Study Patient Name:  ALSACE QUIRINDONGO  Date of Exam:   06/06/2022 Medical Rec #: [id number]      Accession #:    [id number] Date of Birth: 1945/09/12      Patient Gender: F Patient Age:   77 years Exam Location:  [facility] Procedure:      VAS [place] LOWER EXTREMITY VENOUS (DVT) Referring Phys: Adrian Saran BLAND --------------------------------------------------------------------------------  Indications: Pain, Swelling, and follow up SVT scan.  Comparison Study: Previous 05/01/21 SVT in GSV (mid calf to ankle). Performing Technologist: McKayla Maag RVT, VT  Examination Guidelines: A complete evaluation includes B-mode imaging, spectral Doppler, color Doppler, and power Doppler as needed of all accessible portions of each vessel. Bilateral testing is considered an integral part of a complete examination. Limited examinations for reoccurring indications may be performed as noted. The reflux portion of the exam is performed with the patient in reverse Trendelenburg.  +-----+---------------+---------+-----------+----------+--------------+ RIGHTCompressibilityPhasicitySpontaneityPropertiesThrombus Aging +-----+---------------+---------+-----------+----------+--------------+ CFV  Full           Yes      Yes                                  +-----+---------------+---------+-----------+----------+--------------+ SFJ  Full                                                        +-----+---------------+---------+-----------+----------+--------------+   +---------+---------------+---------+-----------+----------+-----------------+ LEFT     CompressibilityPhasicitySpontaneityPropertiesThrombus Aging    +---------+---------------+---------+-----------+----------+-----------------+ CFV      Full           Yes      Yes                                    +---------+---------------+---------+-----------+----------+-----------------+ SFJ      Full                                                           +---------+---------------+---------+-----------+----------+-----------------+ FV Prox  Full                                                           +---------+---------------+---------+-----------+----------+-----------------+ FV Mid   Full                                                           +---------+---------------+---------+-----------+----------+-----------------+ FV Distal               Yes      Yes                                    +---------+---------------+---------+-----------+----------+-----------------+  PFV      Full                                                           +---------+---------------+---------+-----------+----------+-----------------+ POP      Full           Yes      Yes                                    +---------+---------------+---------+-----------+----------+-----------------+ PTV      Full                                                           +---------+---------------+---------+-----------+----------+-----------------+ PERO     Full                                                           +---------+---------------+---------+-----------+----------+-----------------+ GSV      Partial        No       No                   Age  Indeterminate +---------+---------------+---------+-----------+----------+-----------------+    Summary: RIGHT: - No evidence of common femoral vein obstruction.  LEFT: - Findings consistent with age indeterminate superficial vein thrombosis involving the left great saphenous vein (segment in mid calf and segment at the ankle). - Findings appear improved from previous examination. - There is no evidence of deep vein thrombosis in the lower extremity.  - No cystic structure found in the popliteal fossa.  *See table(s) above for measurements and observations. Electronically signed by Gerarda Fraction on 06/06/2022 at 10:31:03 PM.    Final

## 2022-07-03 NOTE — Assessment & Plan Note (Signed)
She does not need anticoagulation for this I suspect it was triggered by recent trauma from ill fitting shoe, reduced mobility and dehydration She had numerous challenges in the past including 3 pregnancies, multiple surgeries, birth control pills as well as hormone replacement therapy and smoking and never developed DVT She does not need long-term follow-up or extensive workup I reinforced the importance of adequate hydration, exercise as tolerated and to continue taking 81 mg aspirin

## 2022-07-24 DIAGNOSIS — M1991 Primary osteoarthritis, unspecified site: Secondary | ICD-10-CM | POA: Diagnosis not present

## 2022-07-24 DIAGNOSIS — M0589 Other rheumatoid arthritis with rheumatoid factor of multiple sites: Secondary | ICD-10-CM | POA: Diagnosis not present

## 2022-07-24 DIAGNOSIS — M25512 Pain in left shoulder: Secondary | ICD-10-CM | POA: Diagnosis not present

## 2022-07-24 DIAGNOSIS — M5136 Other intervertebral disc degeneration, lumbar region: Secondary | ICD-10-CM | POA: Diagnosis not present

## 2022-07-24 DIAGNOSIS — L4059 Other psoriatic arthropathy: Secondary | ICD-10-CM | POA: Diagnosis not present

## 2022-07-24 DIAGNOSIS — E669 Obesity, unspecified: Secondary | ICD-10-CM | POA: Diagnosis not present

## 2022-07-24 DIAGNOSIS — I82890 Acute embolism and thrombosis of other specified veins: Secondary | ICD-10-CM | POA: Diagnosis not present

## 2022-07-24 DIAGNOSIS — L401 Generalized pustular psoriasis: Secondary | ICD-10-CM | POA: Diagnosis not present

## 2022-07-24 DIAGNOSIS — Z6834 Body mass index (BMI) 34.0-34.9, adult: Secondary | ICD-10-CM | POA: Diagnosis not present

## 2022-07-24 DIAGNOSIS — R0602 Shortness of breath: Secondary | ICD-10-CM | POA: Diagnosis not present

## 2022-07-25 DIAGNOSIS — R1013 Epigastric pain: Secondary | ICD-10-CM | POA: Diagnosis not present

## 2022-07-25 DIAGNOSIS — K219 Gastro-esophageal reflux disease without esophagitis: Secondary | ICD-10-CM | POA: Diagnosis not present

## 2022-07-25 DIAGNOSIS — Z1211 Encounter for screening for malignant neoplasm of colon: Secondary | ICD-10-CM | POA: Diagnosis not present

## 2022-07-31 DIAGNOSIS — I10 Essential (primary) hypertension: Secondary | ICD-10-CM | POA: Diagnosis not present

## 2022-07-31 DIAGNOSIS — L4059 Other psoriatic arthropathy: Secondary | ICD-10-CM | POA: Diagnosis not present

## 2022-07-31 DIAGNOSIS — E669 Obesity, unspecified: Secondary | ICD-10-CM | POA: Diagnosis not present

## 2022-08-02 DIAGNOSIS — H35351 Cystoid macular degeneration, right eye: Secondary | ICD-10-CM | POA: Diagnosis not present

## 2022-08-02 DIAGNOSIS — H04123 Dry eye syndrome of bilateral lacrimal glands: Secondary | ICD-10-CM | POA: Diagnosis not present

## 2022-08-02 DIAGNOSIS — T8529XD Other mechanical complication of intraocular lens, subsequent encounter: Secondary | ICD-10-CM | POA: Diagnosis not present

## 2022-08-02 DIAGNOSIS — H59021 Cataract (lens) fragments in eye following cataract surgery, right eye: Secondary | ICD-10-CM | POA: Diagnosis not present

## 2022-08-02 DIAGNOSIS — H18231 Secondary corneal edema, right eye: Secondary | ICD-10-CM | POA: Diagnosis not present

## 2022-08-14 DIAGNOSIS — Z1211 Encounter for screening for malignant neoplasm of colon: Secondary | ICD-10-CM | POA: Diagnosis not present

## 2022-08-14 DIAGNOSIS — K5730 Diverticulosis of large intestine without perforation or abscess without bleeding: Secondary | ICD-10-CM | POA: Diagnosis not present

## 2022-08-21 DIAGNOSIS — H35351 Cystoid macular degeneration, right eye: Secondary | ICD-10-CM | POA: Diagnosis not present

## 2022-08-21 DIAGNOSIS — H18231 Secondary corneal edema, right eye: Secondary | ICD-10-CM | POA: Diagnosis not present

## 2022-08-21 DIAGNOSIS — T8529XD Other mechanical complication of intraocular lens, subsequent encounter: Secondary | ICD-10-CM | POA: Diagnosis not present

## 2022-08-21 DIAGNOSIS — H04123 Dry eye syndrome of bilateral lacrimal glands: Secondary | ICD-10-CM | POA: Diagnosis not present

## 2022-08-21 DIAGNOSIS — H59021 Cataract (lens) fragments in eye following cataract surgery, right eye: Secondary | ICD-10-CM | POA: Diagnosis not present

## 2022-08-25 ENCOUNTER — Other Ambulatory Visit: Payer: Self-pay | Admitting: Cardiology

## 2022-08-25 ENCOUNTER — Other Ambulatory Visit: Payer: Self-pay | Admitting: Pulmonary Disease

## 2022-08-25 DIAGNOSIS — E785 Hyperlipidemia, unspecified: Secondary | ICD-10-CM

## 2022-08-25 DIAGNOSIS — I2510 Atherosclerotic heart disease of native coronary artery without angina pectoris: Secondary | ICD-10-CM

## 2022-10-02 DIAGNOSIS — H04123 Dry eye syndrome of bilateral lacrimal glands: Secondary | ICD-10-CM | POA: Diagnosis not present

## 2022-10-02 DIAGNOSIS — H35351 Cystoid macular degeneration, right eye: Secondary | ICD-10-CM | POA: Diagnosis not present

## 2022-10-02 DIAGNOSIS — T8529XD Other mechanical complication of intraocular lens, subsequent encounter: Secondary | ICD-10-CM | POA: Diagnosis not present

## 2022-10-10 DIAGNOSIS — Z91010 Allergy to peanuts: Secondary | ICD-10-CM | POA: Diagnosis not present

## 2022-10-10 DIAGNOSIS — Z91013 Allergy to seafood: Secondary | ICD-10-CM | POA: Diagnosis not present

## 2022-10-10 DIAGNOSIS — R0602 Shortness of breath: Secondary | ICD-10-CM | POA: Diagnosis not present

## 2022-10-10 DIAGNOSIS — R21 Rash and other nonspecific skin eruption: Secondary | ICD-10-CM | POA: Diagnosis not present

## 2022-10-14 ENCOUNTER — Other Ambulatory Visit: Payer: Self-pay | Admitting: Cardiology

## 2022-10-14 DIAGNOSIS — J019 Acute sinusitis, unspecified: Secondary | ICD-10-CM | POA: Diagnosis not present

## 2022-10-14 DIAGNOSIS — J209 Acute bronchitis, unspecified: Secondary | ICD-10-CM | POA: Diagnosis not present

## 2022-10-23 DIAGNOSIS — L4059 Other psoriatic arthropathy: Secondary | ICD-10-CM | POA: Diagnosis not present

## 2022-10-31 DIAGNOSIS — H2512 Age-related nuclear cataract, left eye: Secondary | ICD-10-CM | POA: Diagnosis not present

## 2022-10-31 DIAGNOSIS — T8529XD Other mechanical complication of intraocular lens, subsequent encounter: Secondary | ICD-10-CM | POA: Diagnosis not present

## 2022-10-31 DIAGNOSIS — H04123 Dry eye syndrome of bilateral lacrimal glands: Secondary | ICD-10-CM | POA: Diagnosis not present

## 2022-10-31 DIAGNOSIS — H35351 Cystoid macular degeneration, right eye: Secondary | ICD-10-CM | POA: Diagnosis not present

## 2022-11-01 DIAGNOSIS — E1169 Type 2 diabetes mellitus with other specified complication: Secondary | ICD-10-CM | POA: Diagnosis not present

## 2022-11-01 DIAGNOSIS — Z6834 Body mass index (BMI) 34.0-34.9, adult: Secondary | ICD-10-CM | POA: Diagnosis not present

## 2022-11-01 DIAGNOSIS — I10 Essential (primary) hypertension: Secondary | ICD-10-CM | POA: Diagnosis not present

## 2022-11-01 DIAGNOSIS — R7303 Prediabetes: Secondary | ICD-10-CM | POA: Diagnosis not present

## 2022-11-01 DIAGNOSIS — N189 Chronic kidney disease, unspecified: Secondary | ICD-10-CM | POA: Diagnosis not present

## 2022-11-03 ENCOUNTER — Other Ambulatory Visit: Payer: Self-pay | Admitting: Cardiology

## 2022-11-03 DIAGNOSIS — I2510 Atherosclerotic heart disease of native coronary artery without angina pectoris: Secondary | ICD-10-CM

## 2022-11-03 DIAGNOSIS — E785 Hyperlipidemia, unspecified: Secondary | ICD-10-CM

## 2022-11-14 DIAGNOSIS — H35363 Drusen (degenerative) of macula, bilateral: Secondary | ICD-10-CM | POA: Diagnosis not present

## 2022-11-14 DIAGNOSIS — H35033 Hypertensive retinopathy, bilateral: Secondary | ICD-10-CM | POA: Diagnosis not present

## 2022-11-14 DIAGNOSIS — H04123 Dry eye syndrome of bilateral lacrimal glands: Secondary | ICD-10-CM | POA: Diagnosis not present

## 2022-11-14 DIAGNOSIS — Z961 Presence of intraocular lens: Secondary | ICD-10-CM | POA: Diagnosis not present

## 2022-11-16 DIAGNOSIS — M13162 Monoarthritis, not elsewhere classified, left knee: Secondary | ICD-10-CM | POA: Diagnosis not present

## 2022-11-16 DIAGNOSIS — M13161 Monoarthritis, not elsewhere classified, right knee: Secondary | ICD-10-CM | POA: Diagnosis not present

## 2022-11-16 DIAGNOSIS — L4059 Other psoriatic arthropathy: Secondary | ICD-10-CM | POA: Diagnosis not present

## 2022-11-16 DIAGNOSIS — I10 Essential (primary) hypertension: Secondary | ICD-10-CM | POA: Diagnosis not present

## 2022-11-28 DIAGNOSIS — M1711 Unilateral primary osteoarthritis, right knee: Secondary | ICD-10-CM | POA: Diagnosis not present

## 2022-11-29 DIAGNOSIS — H04123 Dry eye syndrome of bilateral lacrimal glands: Secondary | ICD-10-CM | POA: Diagnosis not present

## 2022-11-29 DIAGNOSIS — H35033 Hypertensive retinopathy, bilateral: Secondary | ICD-10-CM | POA: Diagnosis not present

## 2022-11-29 DIAGNOSIS — H35363 Drusen (degenerative) of macula, bilateral: Secondary | ICD-10-CM | POA: Diagnosis not present

## 2022-12-17 DIAGNOSIS — M549 Dorsalgia, unspecified: Secondary | ICD-10-CM | POA: Diagnosis not present

## 2022-12-17 DIAGNOSIS — E669 Obesity, unspecified: Secondary | ICD-10-CM | POA: Diagnosis not present

## 2022-12-17 DIAGNOSIS — N189 Chronic kidney disease, unspecified: Secondary | ICD-10-CM | POA: Diagnosis not present

## 2022-12-17 DIAGNOSIS — L4052 Psoriatic arthritis mutilans: Secondary | ICD-10-CM | POA: Diagnosis not present

## 2022-12-17 DIAGNOSIS — Z0000 Encounter for general adult medical examination without abnormal findings: Secondary | ICD-10-CM | POA: Diagnosis not present

## 2022-12-20 DIAGNOSIS — H35033 Hypertensive retinopathy, bilateral: Secondary | ICD-10-CM | POA: Diagnosis not present

## 2022-12-20 DIAGNOSIS — H04123 Dry eye syndrome of bilateral lacrimal glands: Secondary | ICD-10-CM | POA: Diagnosis not present

## 2022-12-20 DIAGNOSIS — H35363 Drusen (degenerative) of macula, bilateral: Secondary | ICD-10-CM | POA: Diagnosis not present

## 2022-12-26 DIAGNOSIS — M1711 Unilateral primary osteoarthritis, right knee: Secondary | ICD-10-CM | POA: Diagnosis not present

## 2023-01-04 ENCOUNTER — Other Ambulatory Visit: Payer: Self-pay | Admitting: Cardiology

## 2023-01-04 DIAGNOSIS — E785 Hyperlipidemia, unspecified: Secondary | ICD-10-CM

## 2023-01-04 DIAGNOSIS — I2510 Atherosclerotic heart disease of native coronary artery without angina pectoris: Secondary | ICD-10-CM

## 2023-01-08 DIAGNOSIS — M8589 Other specified disorders of bone density and structure, multiple sites: Secondary | ICD-10-CM | POA: Diagnosis not present

## 2023-01-08 DIAGNOSIS — Z1231 Encounter for screening mammogram for malignant neoplasm of breast: Secondary | ICD-10-CM | POA: Diagnosis not present

## 2023-01-14 ENCOUNTER — Other Ambulatory Visit: Payer: Medicare Other

## 2023-01-14 ENCOUNTER — Ambulatory Visit (HOSPITAL_COMMUNITY): Payer: Medicare Other | Attending: Cardiology

## 2023-01-14 DIAGNOSIS — I2510 Atherosclerotic heart disease of native coronary artery without angina pectoris: Secondary | ICD-10-CM | POA: Diagnosis not present

## 2023-01-14 DIAGNOSIS — R0609 Other forms of dyspnea: Secondary | ICD-10-CM | POA: Diagnosis not present

## 2023-01-14 LAB — ECHOCARDIOGRAM COMPLETE
Area-P 1/2: 3.69 cm2
S' Lateral: 2.7 cm

## 2023-01-22 ENCOUNTER — Other Ambulatory Visit (HOSPITAL_BASED_OUTPATIENT_CLINIC_OR_DEPARTMENT_OTHER): Payer: Self-pay

## 2023-01-22 DIAGNOSIS — M1991 Primary osteoarthritis, unspecified site: Secondary | ICD-10-CM | POA: Diagnosis not present

## 2023-01-22 DIAGNOSIS — M0589 Other rheumatoid arthritis with rheumatoid factor of multiple sites: Secondary | ICD-10-CM | POA: Diagnosis not present

## 2023-01-22 DIAGNOSIS — R0602 Shortness of breath: Secondary | ICD-10-CM | POA: Diagnosis not present

## 2023-01-22 DIAGNOSIS — L4059 Other psoriatic arthropathy: Secondary | ICD-10-CM | POA: Diagnosis not present

## 2023-01-22 DIAGNOSIS — E669 Obesity, unspecified: Secondary | ICD-10-CM | POA: Diagnosis not present

## 2023-01-22 DIAGNOSIS — M5136 Other intervertebral disc degeneration, lumbar region with discogenic back pain only: Secondary | ICD-10-CM | POA: Diagnosis not present

## 2023-01-22 DIAGNOSIS — M25512 Pain in left shoulder: Secondary | ICD-10-CM | POA: Diagnosis not present

## 2023-01-22 DIAGNOSIS — I82890 Acute embolism and thrombosis of other specified veins: Secondary | ICD-10-CM | POA: Diagnosis not present

## 2023-01-22 DIAGNOSIS — Z6834 Body mass index (BMI) 34.0-34.9, adult: Secondary | ICD-10-CM | POA: Diagnosis not present

## 2023-01-22 DIAGNOSIS — L401 Generalized pustular psoriasis: Secondary | ICD-10-CM | POA: Diagnosis not present

## 2023-01-22 DIAGNOSIS — Z111 Encounter for screening for respiratory tuberculosis: Secondary | ICD-10-CM | POA: Diagnosis not present

## 2023-01-22 MED ORDER — COVID-19 MRNA VAC-TRIS(PFIZER) 30 MCG/0.3ML IM SUSY
0.3000 mL | PREFILLED_SYRINGE | Freq: Once | INTRAMUSCULAR | 0 refills | Status: AC
  Filled 2023-01-22: qty 0.3, 1d supply, fill #0

## 2023-01-25 ENCOUNTER — Ambulatory Visit: Payer: Self-pay | Admitting: Cardiology

## 2023-02-01 ENCOUNTER — Encounter: Payer: Self-pay | Admitting: Cardiology

## 2023-02-01 ENCOUNTER — Ambulatory Visit: Payer: Medicare Other | Attending: Cardiology | Admitting: Cardiology

## 2023-02-01 VITALS — BP 128/60 | HR 72 | Resp 16 | Ht 66.0 in | Wt 206.8 lb

## 2023-02-01 DIAGNOSIS — Z6833 Body mass index (BMI) 33.0-33.9, adult: Secondary | ICD-10-CM | POA: Insufficient documentation

## 2023-02-01 DIAGNOSIS — R0609 Other forms of dyspnea: Secondary | ICD-10-CM | POA: Diagnosis not present

## 2023-02-01 DIAGNOSIS — E6609 Other obesity due to excess calories: Secondary | ICD-10-CM | POA: Diagnosis not present

## 2023-02-01 DIAGNOSIS — E66811 Obesity, class 1: Secondary | ICD-10-CM | POA: Insufficient documentation

## 2023-02-01 DIAGNOSIS — I10 Essential (primary) hypertension: Secondary | ICD-10-CM | POA: Insufficient documentation

## 2023-02-01 DIAGNOSIS — I2510 Atherosclerotic heart disease of native coronary artery without angina pectoris: Secondary | ICD-10-CM | POA: Insufficient documentation

## 2023-02-01 DIAGNOSIS — E785 Hyperlipidemia, unspecified: Secondary | ICD-10-CM | POA: Diagnosis not present

## 2023-02-01 NOTE — Patient Instructions (Signed)

## 2023-02-01 NOTE — Progress Notes (Signed)
Cardiology Office Note:  .   Date:  02/01/2023  ID:  Cheyenne Gould, DOB 1945-11-05, MRN [id number] PCP:  [name], MD  Former Cardiology Providers: Altamese Hanover, APRN, FNP-C, Dr. Yates Decamp  [facility] Providers Cardiologist:  Tessa Lerner, DO , [facility] (established care 07/23/2019  ) Electrophysiologist:  None  Click to update primary MD,subspecialty MD or APP then REFRESH:1}    Chief Complaint  Patient presents with   Nonobstructive atherosclerosis of coronary artery   Follow-up    History of Present Illness: .   Cheyenne Gould is a 77 y.o. 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   Referred to the practice for evaluation of shortness of breath.  Ischemic workup noted nonobstructive CAD. Given her chronic dyspnea she did undergo right heart catheterization for evaluation for pulmonary artery disease given her history of ILD and RA.  Based on hemodynamics to be pressures are within normal limits.  She presents today for 1 year follow-up study.  Since last office visit patient states that shortness of breath is improved with up titration of inhalers and medical therapy.  However she stopped using the inhalers for reasons unknown and also has gained weight since last office visit.  Over the last 2 months that her dyspnea has resurfaced but overall intensity, frequency and duration is not as severe as before.  Review of Systems: .   Review of Systems  Cardiovascular:  Negative for chest pain, claudication, irregular heartbeat, leg swelling, near-syncope, orthopnea, palpitations, paroxysmal nocturnal dyspnea and syncope.  Respiratory:  Negative for shortness of breath.   Hematologic/Lymphatic: Negative for bleeding problem.     Studies Reviewed:   EKG: EKG Interpretation Date/Time:  Friday February 01 2023 09:19:22 EST Ventricular Rate:  68 PR Interval:  150 QRS Duration:  78 QT Interval:  358 QTC Calculation: 380 R Axis:   -1  Text Interpretation: Normal sinus rhythm Nonspecific T wave abnormality When compared with ECG of 07-Jun-2015 21:17, HEART RATE has decreased Confirmed by Tessa Lerner [phone]) on 02/01/2023 9:42:22 AM  Echocardiogram: January 14 2023 LVEF: 60 to 65% Diastolic Function: Normal Average GLS: -20.7 Aortic valve sclerosis without stenosis, mild AR Estimated RAP 3 mmHg    Stress Testing:  Exercise myoview stress 04/26/2017: Exercised for 4 minutes and 50 seconds, achieved 4.6 METS, 97% of maximum predicted heart rate, low exercise capacity, stress ECG negative for ischemia. Stress and rest SPECT images demonstrate homogeneous tracer distribution throughout the myocardium. Gated SPECT imaging reveals normal myocardial thickening and wall motion.  LVEF 72%.  Low risk study.   Right and left heart catheterization 06/14/2020: Right heart catheterization: RA 1/4, mean -1 mmHg; RV 24/2, EDP 5 mmHg; PA 19/7, mean 13; PA saturation 76%.  PW 5/3, mean 2 mmHg; aortic saturation 100%. CO 7.42, CI 3.7 by Fick.  QP/QS 1.00. Left heart catheterization: LV: Normal LV systolic function without wall motion abnormality.  Normal EDP at 16 mmHg.  No pressure gradient across the aortic valve. Left main: Large vessel, mild coronary calcification noted. LAD: Severe extraluminal calcification noted on the proximal LAD.  Mid LAD has a 20 to 30% stenosis with moderate diffuse calcification.  Proximal large D1 has a 40 to 50% stenosis, again calcified. CX: Large caliber vessel.  Continues as a large OM 2.  Minimal luminal irregularity.  Mild calcification  in the proximal segment. RI: Very small vessel with mild proximal calcification. RCA: Dominant, moderate size vessel, mild luminal irregularity, mild  proximal coronary calcification.   Recommendation: Patient's symptoms of dyspnea are probably noncardiac.  No significant coronary disease.  25 to 30 mL contrast utilized.  RADIOLOGY: NA  Risk Assessment/Calculations:   NA   Labs:    External Labs: Collected: January 27, 2023 available in Care Everywhere. Hemoglobin 10.9 g/dL, hematocrit 10.2%. BUN 12, creatinine 1.23. eGFR 46. Sodium 142, potassium 4.4, chloride 103, bicarb 26. AST, ALT, alkaline phosphatase within normal limits.  Physical Exam:    Today's Vitals   02/01/23 0916  BP: 128/60  Pulse: 72  Resp: 16  SpO2: 90%  Weight: 206 lb 12.8 oz (93.8 kg)  Height: 5\' 6"  (1.676 m)   Body mass index is 33.38 kg/m. Wt Readings from Last 3 Encounters:  02/01/23 206 lb 12.8 oz (93.8 kg)  07/03/22 209 lb 3.2 oz (94.9 kg)  06/01/22 202 lb (91.6 kg)    Physical Exam  Constitutional: No distress.  Age appropriate, hemodynamically stable.   Neck: No JVD present.  Cardiovascular: Normal rate, regular rhythm, S1 normal, S2 normal, intact distal pulses and normal pulses. Exam reveals no gallop, no S3 and no S4.  No murmur heard. Pulmonary/Chest: Effort normal and breath sounds normal. No stridor. She has no wheezes. She has no rales.  Abdominal: Soft. Bowel sounds are normal. She exhibits no distension. There is no abdominal tenderness.  Musculoskeletal:        General: No edema.     Cervical back: Neck supple.  Neurological: She is alert and oriented to person, place, and time. She has intact cranial nerves (2-12).  Skin: Skin is warm and moist.     Impression & Recommendation(s):  Impression:   ICD-10-CM   1. Nonobstructive atherosclerosis of coronary artery  I25.10 EKG 12-Lead    2. Coronary artery calcification seen on CT scan  I25.10     3. Dyspnea on exertion  R06.09     4. Benign hypertension  I10     5. Mild hyperlipidemia  E78.5     6. Class 1 obesity due to excess calories with serious comorbidity and  body mass index (BMI) of 33.0 to 33.9 in adult  E66.811    E66.09    Z68.33        Recommendation(s):  Nonobstructive atherosclerosis of coronary artery Coronary artery calcification seen on CT scan Chronic and stable. Denies anginal chest pain. EKG today is nonischemic. No recent labs available for review, patient states that lipids are currently being checked by PCP. Continue aspirin 81 mg p.o. daily. Continue with Zetia 10 mg p.o. daily as well as simvastatin 10 mg p.o. daily  Dyspnea on exertion Chronic and stable. Multifactorial-obesity/deconditioning, former smoker, was being worked up by pulmonary medicine for ILD. Since last office visit patient states that she did have improvement in her dyspnea with inhalers provided by pulmonary medicine.  However once she started feeling well she has either stopped or decreased frequency of using her inhalers regularly as prescribed. In the recent past her dyspnea has resurfaced. For now I have asked her to restart her inhaler therapy and follow-up with pulmonary medicine.  Benign hypertension Office blood pressures are very well-controlled. Continue Diovan/HCTZ 160/12.5 half a tablet a day Continue Norvasc 5 mg p.o. daily. Medications are currently being managed by PCP. Would recommend either changing Diovan/HCTZ to a lower dose or to a different agent as patient  is only taking half a tablet.  Patient to discuss further with her prescribing physician.  Mild hyperlipidemia Currently on simvastatin 10 mg p.o. daily as well as Zetia 10 mg p.o. daily. No recent labs available for review. Patient states that the cholesterol is currently being checked by Dr. Parke Simmers. If lipids are well-controlled may consider discontinuing Zetia and placing her on a higher dose of statin if she is able to tolerate it (to minimize pill burden).  Class 1 obesity due to excess calories with serious comorbidity and body mass index (BMI) of 33.0 to 33.9 in  adult Body mass index is 33.38 kg/m. I reviewed with her importance of diet, regular physical activity/exercise, weight loss.   Patient is educated on the importance of increasing physical activity gradually as tolerated with a goal of moderate intensity exercise for 30 minutes a day 5 days a week.  As part of today's office visit discussed management of at least 2 chronic comorbid conditions, independently reviewed and performed EKG, independently reviewed labs from Care Everywhere as of December 2024.  Orders Placed:  Orders Placed This Encounter  Procedures   EKG 12-Lead   Final Medication List:   No orders of the defined types were placed in this encounter.   There are no discontinued medications.   Current Outpatient Medications:    acetaminophen (TYLENOL) 500 MG tablet, Take 1,000 mg by mouth every 6 (six) hours as needed for mild pain or headache., Disp: , Rfl:    albuterol (PROAIR HFA) 108 (90 Base) MCG/ACT inhaler, Inhale 2 puffs into the lungs every 6 (six) hours as needed for wheezing or shortness of breath., Disp: 1 Inhaler, Rfl: 3   amLODipine (NORVASC) 5 MG tablet, Take 5 mg by mouth daily., Disp: , Rfl:    Cholecalciferol (VITAMIN D3) 2000 units TABS, Take 2,000 Units by mouth daily., Disp: , Rfl:    clindamycin (CLEOCIN) 300 MG capsule, Take 300 mg by mouth as needed (Pre dental procedures)., Disp: , Rfl:    COVID-19 mRNA vaccine 2023-2024 (COMIRNATY) syringe, Inject into the muscle., Disp: 0.3 mL, Rfl: 0   [employer] ASPIRIN LOW DOSE 81 MG tablet, TAKE 1 TABLET (81 MG TOTAL) BY MOUTH DAILY. SWALLOW WHOLE., Disp: 90 tablet, Rfl: 3   EPIPEN 2-PAK 0.3 MG/0.3ML SOAJ injection, Inject 0.3 mg into the muscle as needed for anaphylaxis., Disp: , Rfl:    ezetimibe (ZETIA) 10 MG tablet, TAKE 1 TABLET BY MOUTH EVERY DAY, Disp: 90 tablet, Rfl: 0   folic acid (FOLVITE) 1 MG tablet, Take 2 mg by mouth daily., Disp: , Rfl: 3   influenza vaccine adjuvanted (FLUAD QUADRIVALENT) 0.5 ML  injection, Inject into the muscle., Disp: 0.5 mL, Rfl: 0   lansoprazole (PREVACID) 15 MG capsule, Take 15 mg by mouth daily at 12 noon., Disp: , Rfl:    levocetirizine (XYZAL) 5 MG tablet, Take 5 mg by mouth at bedtime. , Disp: , Rfl:    metoprolol succinate (TOPROL-XL) 50 MG 24 hr tablet, TAKE 1 TABLET BY MOUTH EVERY DAY WITH OR IMMEDIATELY FOLLOWING A MEAL, Disp: 90 tablet, Rfl: 2   montelukast (SINGULAIR) 10 MG tablet, TAKE 1 TABLET BY MOUTH EVERYDAY AT BEDTIME, Disp: 90 tablet, Rfl: 3   PROLENSA 0.07 % SOLN, Apply to eye., Disp: , Rfl:    simvastatin (ZOCOR) 10 MG tablet, TAKE 1 TABLET BY MOUTH EVERYDAY AT BEDTIME, Disp: 90 tablet, Rfl: 3   Upadacitinib ER (RINVOQ) 15 MG TB24, 1 tablet Orally Once a day for 30  day(s), Disp: , Rfl:    valACYclovir (VALTREX) 500 MG tablet, Take 500 mg by mouth daily., Disp: , Rfl:    valsartan-hydrochlorothiazide (DIOVAN-HCT) 160-12.5 MG tablet, Take 0.5 tablets by mouth daily., Disp: , Rfl:    azelastine (ASTELIN) 0.1 % nasal spray, Place 2 sprays into both nostrils daily. Use in each nostril as directed, Disp: , Rfl:    budesonide-formoterol (SYMBICORT) 160-4.5 MCG/ACT inhaler, Inhale 2 puffs into the lungs 2 (two) times daily., Disp: 10.2 g, Rfl: 5   XIIDRA 5 % SOLN, Apply 1 drop to eye 2 (two) times daily., Disp: , Rfl:   Consent:   N/A  Disposition:   1 year follow-up sooner if needed  Patient may be asked to follow-up sooner based on the results of the above-mentioned testing.  Her questions and concerns were addressed to her satisfaction. She voices understanding of the recommendations provided during this encounter.    Signed, Tessa Lerner, DO, [facility]  [facility]  [address] [place], [place] [address] 02/01/2023 9:42 AM

## 2023-02-05 DIAGNOSIS — H04123 Dry eye syndrome of bilateral lacrimal glands: Secondary | ICD-10-CM | POA: Diagnosis not present

## 2023-02-05 DIAGNOSIS — H35363 Drusen (degenerative) of macula, bilateral: Secondary | ICD-10-CM | POA: Diagnosis not present

## 2023-02-05 DIAGNOSIS — H35033 Hypertensive retinopathy, bilateral: Secondary | ICD-10-CM | POA: Diagnosis not present

## 2023-02-06 ENCOUNTER — Ambulatory Visit (HOSPITAL_COMMUNITY)
Admission: EM | Admit: 2023-02-06 | Discharge: 2023-02-06 | Disposition: A | Discharge status: Home / Self Care | Payer: Medicare Other | Attending: Emergency Medicine | Admitting: Emergency Medicine

## 2023-02-06 ENCOUNTER — Encounter (HOSPITAL_COMMUNITY): Payer: Self-pay | Admitting: Emergency Medicine

## 2023-02-06 DIAGNOSIS — R220 Localized swelling, mass and lump, head: Secondary | ICD-10-CM | POA: Diagnosis not present

## 2023-02-06 MED ORDER — CLINDAMYCIN HCL 300 MG PO CAPS: 300.0000 mg | ORAL_CAPSULE | Freq: Three times a day (TID) | ORAL | 0 refills | Status: AC

## 2023-02-06 NOTE — ED Provider Notes (Signed)
[facility]    CSN: [id number] Arrival date & time: 02/06/23  1251      History   Chief Complaint Chief Complaint  Patient presents with   Facial Swelling    HPI Cheyenne Gould is a 77 y.o. female.   Patient presents to clinic over complaints of left-sided facial swelling that started sometime yesterday.  Her sister was the one who initially noticed the swelling.  When she woke up today she noticed that the swelling was much worse.   Denies any recent contact with allergies.  She does not have any itching, no shortness of breath, oral swelling, drooling or trouble swallowing.  No wheezing.  She has not had any fevers.  She does have a left upper partial and tooth with a root canal.  She has not had any dental pain.  No pain at all.    The history is provided by the patient and medical records.    Past Medical History:  Diagnosis Date   Acid reflux    AKI (acute kidney injury) (HCC) 06/07/2015   Allergic sinusitis    Anemia    CAP (community acquired pneumonia) 06/07/2015   Chest pain 01/01/2014   Coronary artery calcification    Hyperlipidemia    Hypertension    Hypoxia    Rheumatoid arthritis (HCC)    on Leflunomide    Rheumatoid arthritis(714.0)    on Leflunomide    Patient Active Problem List   Diagnosis Date Noted   Acute superficial venous thrombosis of left lower extremity 07/03/2022   Dyspnea on exertion 06/14/2020   Coronary artery calcification seen on CAT scan 06/14/2020   ILD (interstitial lung disease) (HCC) 03/31/2019   Bronchitis 03/31/2019    Class: Acute   Palpitations 08/16/2016   AKI (acute kidney injury) (HCC) 06/07/2015   Hypertension    Rheumatoid arthritis (HCC)    Hyperlipidemia    Anemia     Past Surgical History:  Procedure Laterality Date   ABDOMINAL HYSTERECTOMY  1985   Partial   BUNIONECTOMY  2002,12/18/2006   RIGHT/LEFT HEART CATH AND CORONARY ANGIOGRAPHY N/A 06/14/2020   Procedure: RIGHT/LEFT HEART CATH AND  CORONARY ANGIOGRAPHY;  Surgeon: Yates Decamp, MD;  [facility];  Service: Cardiovascular;  Laterality: N/A;    OB History   No obstetric history on file.      Home Medications    Prior to Admission medications   Medication Sig Start Date End Date Taking? Authorizing Provider  clindamycin (CLEOCIN) 300 MG capsule Take 1 capsule (300 mg total) by mouth 3 (three) times daily for 5 days. 02/06/23 02/11/23 Yes Rinaldo Ratel, [place], FNP  acetaminophen (TYLENOL) 500 MG tablet Take 1,000 mg by mouth every 6 (six) hours as needed for mild pain or headache.    [provider]  albuterol (PROAIR HFA) 108 (90 Base) MCG/ACT inhaler Inhale 2 puffs into the lungs every 6 (six) hours as needed for wheezing or shortness of breath. 06/28/17   Mannam, Colbert Coyer, MD  amLODipine (NORVASC) 5 MG tablet Take 5 mg by mouth daily. 04/06/19   [provider]  azelastine (ASTELIN) 0.1 % nasal spray Place 2 sprays into both nostrils daily. Use in each nostril as directed    [provider]  budesonide-formoterol (SYMBICORT) 160-4.5 MCG/ACT inhaler Inhale 2 puffs into the lungs 2 (two) times daily. 02/09/20   Mannam, Colbert Coyer, MD  Cholecalciferol (VITAMIN D3) 2000 units TABS Take 2,000 Units by mouth daily.    [provider]  clindamycin (CLEOCIN) 300 MG capsule Take 300 mg by mouth as needed (Pre dental procedures). 10/28/19   [provider]  COVID-19 mRNA vaccine [phone] (COMIRNATY) syringe Inject into the muscle. 01/16/22     [employer] ASPIRIN LOW DOSE 81 MG tablet TAKE 1 TABLET (81 MG TOTAL) BY MOUTH DAILY. SWALLOW WHOLE. 02/26/22   Tolia, Sunit, DO  EPIPEN 2-PAK 0.3 MG/0.3ML SOAJ injection Inject 0.3 mg into the muscle as needed for anaphylaxis. 06/14/14   [provider]  ezetimibe (ZETIA) 10 MG tablet TAKE 1 TABLET BY MOUTH EVERY DAY 01/04/23   Tolia, Sunit, DO  folic acid (FOLVITE) 1 MG tablet Take 2 mg by mouth daily. 08/13/14   [provider]   influenza vaccine adjuvanted (FLUAD QUADRIVALENT) 0.5 ML injection Inject into the muscle. 01/16/22     lansoprazole (PREVACID) 15 MG capsule Take 15 mg by mouth daily at 12 noon.    [provider]  levocetirizine (XYZAL) 5 MG tablet Take 5 mg by mouth at bedtime.  05/21/12   [provider]  metoprolol succinate (TOPROL-XL) 50 MG 24 hr tablet TAKE 1 TABLET BY MOUTH EVERY DAY WITH OR IMMEDIATELY FOLLOWING A MEAL 10/15/22   Tolia, Sunit, DO  montelukast (SINGULAIR) 10 MG tablet TAKE 1 TABLET BY MOUTH EVERYDAY AT BEDTIME 08/28/22   Mannam, Praveen, MD  PROLENSA 0.07 % SOLN Apply to eye. 05/30/22   [provider]  simvastatin (ZOCOR) 10 MG tablet TAKE 1 TABLET BY MOUTH EVERYDAY AT BEDTIME 11/06/22   Tolia, Sunit, DO  Upadacitinib ER (RINVOQ) 15 MG TB24 1 tablet Orally Once a day for 30 day(s) 06/28/21   [provider]  valACYclovir (VALTREX) 500 MG tablet Take 500 mg by mouth daily.    [provider]  valsartan-hydrochlorothiazide (DIOVAN-HCT) 160-12.5 MG tablet Take 0.5 tablets by mouth daily.    [provider]  XIIDRA 5 % SOLN Apply 1 drop to eye 2 (two) times daily. 04/26/22   [provider]    Family History Family History  Problem Relation Age of Onset   Heart disease Father    Hypertension Sister    Lupus Brother    Cancer Brother        Colon    Social History Social History   Tobacco Use   Smoking status: Former    Current packs/day: 0.00    Average packs/day: 1 pack/day for 40.0 years (40.0 ttl pk-yrs)    Types: Cigarettes    Start date: 02/20/1963    Quit date: 02/20/2003    Years since quitting: 19.9   Smokeless tobacco: Never  Vaping Use   Vaping status: Never Used  Substance Use Topics   Alcohol use: No   Drug use: No     Allergies   Omeprazole, Peanut-containing drug products, Shellfish allergy, Ixekizumab, Penicillins, Sulfa antibiotics, Allegra [fexofenadine], Leflunomide, and Lyrica  [pregabalin]   Review of Systems Review of Systems  Per HPI   Physical Exam Triage Vital Signs ED Triage Vitals  Encounter Vitals Group     BP 02/06/23 1323 132/77     Systolic BP Percentile --      Diastolic BP Percentile --      Pulse Rate 02/06/23 1323 65     Resp 02/06/23 1323 17     Temp 02/06/23 1323 98.2 F (36.8 C)     Temp Source 02/06/23 1323 Oral     SpO2 02/06/23 1323 94 %     Weight --  Height --      Head Circumference --      Peak Flow --      Pain Score 02/06/23 1322 0     Pain Loc --      Pain Education --      Exclude from Growth Chart --    No data found.  Updated Vital Signs BP 132/77 (BP Location: Left Arm)   Pulse 65   Temp 98.2 F (36.8 C) (Oral)   Resp 17   SpO2 94%   Visual Acuity Right Eye Distance:   Left Eye Distance:   Bilateral Distance:    Right Eye Near:   Left Eye Near:    Bilateral Near:     Physical Exam Vitals and nursing note reviewed.  Constitutional:      Appearance: Normal appearance.  HENT:     Head: Normocephalic and atraumatic.      Comments: Swelling to left side of face. No tenderness, no warmth or erythema. Atraumatic.     Right Ear: External ear normal.     Left Ear: External ear normal.     Nose: Nose normal.     Mouth/Throat:     Mouth: Mucous membranes are moist.  Eyes:     Conjunctiva/sclera: Conjunctivae normal.  Cardiovascular:     Rate and Rhythm: Normal rate.  Pulmonary:     Effort: Pulmonary effort is normal. No respiratory distress.  Musculoskeletal:        General: Normal range of motion.  Skin:    General: Skin is warm and dry.  Neurological:     General: No focal deficit present.     Mental Status: She is alert and oriented to person, place, and time.  Psychiatric:        Mood and Affect: Mood normal.        Behavior: Behavior normal. Behavior is cooperative.      UC Treatments / Results  Labs (all labs ordered are listed, but only abnormal results are displayed) Labs  Reviewed - No data to display  EKG   Radiology No results found.  Procedures Procedures (including critical care time)  Medications Ordered in UC Medications - No data to display  Initial Impression / Assessment and Plan / UC Course  I have reviewed the triage vital signs and the nursing notes.  Pertinent labs & imaging results that were available during my care of the patient were reviewed by me and considered in my medical decision making (see chart for details).  Vitals and triage reviewed, patient is hemodynamically stable.  Some left-sided facial swelling.  Atraumatic.  No pain.  Low concern for cellulitis as there is no warmth, erythema or tenderness to palpation.  Bilateral tympanic membranes are pearly gray, no rashes.  No skin changes.  Potential for parotid gland swelling or dental infection.  Will cover with clindamycin in the case of bacterial infection.  Advised parotid gland massages.  Plan of care, follow-up care return precautions given, no questions at this time.     Final Clinical Impressions(s) / UC Diagnoses   Final diagnoses:  Left facial swelling     Discharge Instructions      It is unclear what is causing your facial swelling, you may have a dental infection.  This could be an infection of the parotid gland as well.  Use ice to help with any swelling or discomfort for 1-15 minutes at a time, with a rag in between your skin and the ice pack.  Take all antibiotics as prescribed and until finished, you can take them with food to help prevent gastrointestinal upset.    If no improvement despite antibiotics please follow-up with your primary care provider.  If you develop any dental pain, please follow-up with your dentist.       ED Prescriptions     Medication Sig Dispense Auth. Provider   clindamycin (CLEOCIN) 300 MG capsule Take 1 capsule (300 mg total) by mouth 3 (three) times daily for 5 days. 15 capsule Mihir Flanigan, [place], [place]      PDMP not  reviewed this encounter.   Kaimani Clayson, [place] N, [place] 02/06/23 1425

## 2023-02-06 NOTE — ED Triage Notes (Signed)
Pt presents with facial swelling on left side that began last night but is worse today

## 2023-02-06 NOTE — Discharge Instructions (Addendum)
It is unclear what is causing your facial swelling, you may have a dental infection.  This could be an infection of the parotid gland as well.  Use ice to help with any swelling or discomfort for 1-15 minutes at a time, with a rag in between your skin and the ice pack.  Take all antibiotics as prescribed and until finished, you can take them with food to help prevent gastrointestinal upset.    If no improvement despite antibiotics please follow-up with your primary care provider.  If you develop any dental pain, please follow-up with your dentist.

## 2023-03-27 DIAGNOSIS — J0100 Acute maxillary sinusitis, unspecified: Secondary | ICD-10-CM | POA: Diagnosis not present

## 2023-03-27 DIAGNOSIS — I10 Essential (primary) hypertension: Secondary | ICD-10-CM | POA: Diagnosis not present

## 2023-04-19 ENCOUNTER — Other Ambulatory Visit: Payer: Self-pay | Admitting: Family Medicine

## 2023-04-19 ENCOUNTER — Ambulatory Visit
Admission: RE | Admit: 2023-04-19 | Discharge: 2023-04-19 | Disposition: A | Discharge status: Home / Self Care | Payer: Medicare Other | Source: Ambulatory Visit | Attending: Family Medicine | Admitting: Family Medicine

## 2023-04-19 DIAGNOSIS — N189 Chronic kidney disease, unspecified: Secondary | ICD-10-CM | POA: Diagnosis not present

## 2023-04-19 DIAGNOSIS — J0100 Acute maxillary sinusitis, unspecified: Secondary | ICD-10-CM

## 2023-04-19 DIAGNOSIS — I129 Hypertensive chronic kidney disease with stage 1 through stage 4 chronic kidney disease, or unspecified chronic kidney disease: Secondary | ICD-10-CM | POA: Diagnosis not present

## 2023-04-19 DIAGNOSIS — E7800 Pure hypercholesterolemia, unspecified: Secondary | ICD-10-CM | POA: Diagnosis not present

## 2023-04-19 DIAGNOSIS — I10 Essential (primary) hypertension: Secondary | ICD-10-CM | POA: Diagnosis not present

## 2023-04-19 DIAGNOSIS — E559 Vitamin D deficiency, unspecified: Secondary | ICD-10-CM | POA: Diagnosis not present

## 2023-04-19 DIAGNOSIS — Z6833 Body mass index (BMI) 33.0-33.9, adult: Secondary | ICD-10-CM | POA: Diagnosis not present

## 2023-04-19 DIAGNOSIS — Z6823 Body mass index (BMI) 23.0-23.9, adult: Secondary | ICD-10-CM | POA: Diagnosis not present

## 2023-04-19 DIAGNOSIS — L4052 Psoriatic arthritis mutilans: Secondary | ICD-10-CM | POA: Diagnosis not present

## 2023-05-03 DIAGNOSIS — I10 Essential (primary) hypertension: Secondary | ICD-10-CM | POA: Diagnosis not present

## 2023-05-03 DIAGNOSIS — R0602 Shortness of breath: Secondary | ICD-10-CM | POA: Diagnosis not present

## 2023-05-03 DIAGNOSIS — E785 Hyperlipidemia, unspecified: Secondary | ICD-10-CM | POA: Diagnosis not present

## 2023-05-20 ENCOUNTER — Telehealth: Payer: Self-pay | Admitting: Cardiology

## 2023-05-20 DIAGNOSIS — R0602 Shortness of breath: Secondary | ICD-10-CM

## 2023-05-20 NOTE — Telephone Encounter (Signed)
 Pt c/o Shortness Of Breath: STAT if SOB developed within the last 24 hours or pt is noticeably SOB on the phone  1. Are you currently SOB (can you hear that pt is SOB on the phone)? No  2. How long have you been experiencing SOB? x2wks worsening  3. Are you SOB when sitting or when up moving around? Up moving around  4. Are you currently experiencing any other symptoms? No

## 2023-05-20 NOTE — Telephone Encounter (Signed)
 Spoke with Cheyenne Gould who complains of increasing SOB on exertion x 2 weeks.  She reports she is using inhalers as prescribed.  She denies current CP, dizziness or edema.  She has not followed up with pulmonology and states she is not due for a follow up at this time.  She does not have a current BP or HR available.  She endorses allergies which she takes daily medication for.   Cheyenne Gould advised will forward to Dr Emelda Brothers nurse for review as Dr Odis Hollingshead is out of the office this week.  Encouraged Cheyenne Gould to take her BP and HR to have available for f/u call.  Encouraged Cheyenne Gould to also contact pulmonology to discuss the increasing SOB.  Cheyenne Gould's echo 11/24 shows normal EF and no valve disease.  Cheyenne Gould verbalizes understanding and agrees with current plan.

## 2023-05-24 DIAGNOSIS — Z6833 Body mass index (BMI) 33.0-33.9, adult: Secondary | ICD-10-CM | POA: Diagnosis not present

## 2023-05-24 DIAGNOSIS — E669 Obesity, unspecified: Secondary | ICD-10-CM | POA: Diagnosis not present

## 2023-05-24 DIAGNOSIS — I129 Hypertensive chronic kidney disease with stage 1 through stage 4 chronic kidney disease, or unspecified chronic kidney disease: Secondary | ICD-10-CM | POA: Diagnosis not present

## 2023-05-24 DIAGNOSIS — N189 Chronic kidney disease, unspecified: Secondary | ICD-10-CM | POA: Diagnosis not present

## 2023-05-24 DIAGNOSIS — L409 Psoriasis, unspecified: Secondary | ICD-10-CM | POA: Diagnosis not present

## 2023-05-27 DIAGNOSIS — H35363 Drusen (degenerative) of macula, bilateral: Secondary | ICD-10-CM | POA: Diagnosis not present

## 2023-05-27 DIAGNOSIS — Z961 Presence of intraocular lens: Secondary | ICD-10-CM | POA: Diagnosis not present

## 2023-05-27 DIAGNOSIS — H35033 Hypertensive retinopathy, bilateral: Secondary | ICD-10-CM | POA: Diagnosis not present

## 2023-05-27 DIAGNOSIS — H04123 Dry eye syndrome of bilateral lacrimal glands: Secondary | ICD-10-CM | POA: Diagnosis not present

## 2023-05-28 NOTE — Telephone Encounter (Signed)
 Spoke with pt over the phone to check on her symptoms. Pt stated she has not been seen by her pulmonologist yet and that she is still waiting to get an appt with them but she stated she did go to her PCP and get an EKG and everything looked fine. She stated that her PCP thinks that the SOB is due to inactivity. Pt states the only time the SOB comes about is with activity, never has SOB at rest. Pt continues to deny any CP, dizziness, or swelling. Pt told to call our office with any other questions or concerns and that this information will be routed to Dr. Odis Hollingshead to review. Pt verbalized understanding of plan.

## 2023-05-29 NOTE — Telephone Encounter (Signed)
 Spoke with pt and she denies all of the following:  any prolonged car rides or plane rides any prolonged immobilization significantly symmetrical swelling shortness of breath with laying flat requiring more pillows to sleep at night lower extremity swelling weight gain   Pt denies any of the symptoms getting worse and reiterates that the only time any SOB occurs is during activity of walking longer distances, etc.

## 2023-05-29 NOTE — Telephone Encounter (Signed)
 Please her last progress note patient has multiple reasons to be short of breath.  However if the symptoms are getting progressively worse that she should see her pulmonologist.  If she is unable to get pulmonary appointment and dyspnea is worsening she should go to the ER for further evaluation and management to make sure she does not have blood clots in legs or lungs.  Please ask if she has had any prolonged car rides or plane rides, any prolonged immobilization, significantly symmetrical swelling, shortness of breath with laying flat, requiring more pillows to sleep at night, lower extremity swelling, weight gain, etc.  Cheyenne Lerner, DO, [facility]

## 2023-06-04 NOTE — Addendum Note (Signed)
 Addended by: Roxianne Coral on: 06/04/2023 03:34 PM   Modules accepted: Orders

## 2023-06-04 NOTE — Telephone Encounter (Signed)
 Check NT-proBNP, BMP, D-dimer.  Repeat an echo for dyspnea.  Arrange follow up w/ me and consider pulmonary evaluation as well due to her ILD (may be progressive).   Diandra Cimini Holly, DO, [facility]

## 2023-06-04 NOTE — Telephone Encounter (Signed)
 Spoke with pt and explained that Dr. Albert Huff has recommended getting blood work completed (BMP, Pro-BNP) and an echo for SOB. Explained that he would also like to get the pt a f/u appt to be seen. Pt stated she would get the blood work completed by the end of the week and has been scheduled for an appt with Dr. Albert Huff for 4/29.

## 2023-06-05 ENCOUNTER — Ambulatory Visit: Admitting: Cardiology

## 2023-06-05 DIAGNOSIS — R0602 Shortness of breath: Secondary | ICD-10-CM | POA: Diagnosis not present

## 2023-06-06 ENCOUNTER — Other Ambulatory Visit: Payer: Self-pay

## 2023-06-06 ENCOUNTER — Emergency Department (HOSPITAL_COMMUNITY)

## 2023-06-06 ENCOUNTER — Emergency Department (HOSPITAL_COMMUNITY)
Admission: EM | Admit: 2023-06-06 | Discharge: 2023-06-06 | Discharge status: Against Medical Advice | Attending: Emergency Medicine | Admitting: Emergency Medicine

## 2023-06-06 ENCOUNTER — Encounter (HOSPITAL_COMMUNITY): Payer: Self-pay | Admitting: *Deleted

## 2023-06-06 DIAGNOSIS — Z5321 Procedure and treatment not carried out due to patient leaving prior to being seen by health care provider: Secondary | ICD-10-CM | POA: Diagnosis not present

## 2023-06-06 DIAGNOSIS — I2510 Atherosclerotic heart disease of native coronary artery without angina pectoris: Secondary | ICD-10-CM | POA: Diagnosis not present

## 2023-06-06 DIAGNOSIS — R0602 Shortness of breath: Secondary | ICD-10-CM | POA: Insufficient documentation

## 2023-06-06 DIAGNOSIS — R7989 Other specified abnormal findings of blood chemistry: Secondary | ICD-10-CM | POA: Diagnosis not present

## 2023-06-06 DIAGNOSIS — I700 Atherosclerosis of aorta: Secondary | ICD-10-CM | POA: Diagnosis not present

## 2023-06-06 LAB — CBC
HCT: 35.2 % — ABNORMAL LOW (ref 36.0–46.0)
Hemoglobin: 10.9 g/dL — ABNORMAL LOW (ref 12.0–15.0)
MCH: 28.4 pg (ref 26.0–34.0)
MCHC: 31 g/dL (ref 30.0–36.0)
MCV: 91.7 fL (ref 80.0–100.0)
Platelets: 377 10*3/uL (ref 150–400)
RBC: 3.84 MIL/uL — ABNORMAL LOW (ref 3.87–5.11)
RDW: 14.5 % (ref 11.5–15.5)
WBC: 7.6 10*3/uL (ref 4.0–10.5)
nRBC: 0 % (ref 0.0–0.2)

## 2023-06-06 LAB — COMPREHENSIVE METABOLIC PANEL WITH GFR
ALT: 10 U/L (ref 0–44)
AST: 22 U/L (ref 15–41)
Albumin: 3.5 g/dL (ref 3.5–5.0)
Alkaline Phosphatase: 61 U/L (ref 38–126)
Anion gap: 9 (ref 5–15)
BUN: 17 mg/dL (ref 8–23)
CO2: 26 mmol/L (ref 22–32)
Calcium: 9.1 mg/dL (ref 8.9–10.3)
Chloride: 102 mmol/L (ref 98–111)
Creatinine, Ser: 1.4 mg/dL — ABNORMAL HIGH (ref 0.44–1.00)
GFR, Estimated: 39 mL/min — ABNORMAL LOW (ref >60)
Glucose, Bld: 96 mg/dL (ref 70–99)
Potassium: 4.4 mmol/L (ref 3.5–5.1)
Sodium: 137 mmol/L (ref 135–145)
Total Bilirubin: 0.3 mg/dL (ref 0.0–1.2)
Total Protein: 6.7 g/dL (ref 6.5–8.1)

## 2023-06-06 LAB — BASIC METABOLIC PANEL WITH GFR
BUN/Creatinine Ratio: 13 (ref 12–28)
BUN: 19 mg/dL (ref 8–27)
CO2: 25 mmol/L (ref 20–29)
Calcium: 9.4 mg/dL (ref 8.7–10.3)
Chloride: 101 mmol/L (ref 96–106)
Creatinine, Ser: 1.42 mg/dL — ABNORMAL HIGH (ref 0.57–1.00)
Glucose: 86 mg/dL (ref 70–99)
Potassium: 4.7 mmol/L (ref 3.5–5.2)
Sodium: 139 mmol/L (ref 134–144)
eGFR: 38 mL/min/{1.73_m2} — ABNORMAL LOW (ref >59)

## 2023-06-06 LAB — PRO B NATRIURETIC PEPTIDE: NT-Pro BNP: 48 pg/mL (ref 0–738)

## 2023-06-06 LAB — D-DIMER, QUANTITATIVE: D-DIMER: 1.47 mg{FEU}/L — ABNORMAL HIGH (ref 0.00–0.49)

## 2023-06-06 MED ORDER — IOHEXOL 350 MG/ML SOLN
75.0000 mL | Freq: Once | INTRAVENOUS | Status: AC | PRN
  Administered 2023-06-06: 75 mL via INTRAVENOUS

## 2023-06-06 NOTE — ED Triage Notes (Signed)
 Pt states that for the past month and a half she has had sob with exertion.  HX of blood clot in her leg and her PCP wanted her evaluated for blood clot.  Pt denies any swelling in her legs.

## 2023-06-06 NOTE — ED Provider Triage Note (Addendum)
 Emergency Medicine Provider Triage Evaluation Note  Keidra Withers , a 78 y.o. female  was evaluated in triage.  Pt complains of SOB with exertion for weeks.  Had an elevated D-dimer with primary care and sent here for CTA to rule out pulmonary embolism. NO Chest pain.   Review of Systems  Positive: SOB Negative: Leg swelling, chest pain  Physical Exam  BP 132/84 (BP Location: Left Arm)   Pulse 66   Temp 98 F (36.7 C)   Resp 20   SpO2 99%  Gen:   Awake, no distress   Resp:  Normal effort  MSK:   Moves extremities without difficulty  Other:    Medical Decision Making  Medically screening exam initiated at 2:38 PM.  Appropriate orders placed.  Jelani Vreeland was informed that the remainder of the evaluation will be completed by another provider, this initial triage assessment does not replace that evaluation, and the importance of remaining in the ED until their evaluation is complete.  Had basic labs yesterday but will repeat due to age and duration.  BNP yesterday was normal thus will not repeat   Eudora Heron, PA-C 06/06/23 1443    Anyah Swallow, Kandace Organ, PA-C 06/06/23 1444

## 2023-06-06 NOTE — ED Notes (Signed)
 Pt came up to this tech and stated that she was leaving. IV removed as per patient's request

## 2023-06-17 ENCOUNTER — Ambulatory Visit (HOSPITAL_COMMUNITY): Attending: Cardiology

## 2023-06-17 DIAGNOSIS — R0602 Shortness of breath: Secondary | ICD-10-CM | POA: Diagnosis not present

## 2023-06-17 LAB — ECHOCARDIOGRAM COMPLETE: S' Lateral: 2.85 cm

## 2023-06-18 ENCOUNTER — Encounter: Payer: Self-pay | Admitting: Cardiology

## 2023-06-18 ENCOUNTER — Ambulatory Visit: Attending: Cardiology | Admitting: Cardiology

## 2023-06-18 VITALS — BP 104/52 | HR 62 | Ht 66.0 in | Wt 211.8 lb

## 2023-06-18 DIAGNOSIS — E6609 Other obesity due to excess calories: Secondary | ICD-10-CM | POA: Diagnosis not present

## 2023-06-18 DIAGNOSIS — I10 Essential (primary) hypertension: Secondary | ICD-10-CM | POA: Insufficient documentation

## 2023-06-18 DIAGNOSIS — E66811 Obesity, class 1: Secondary | ICD-10-CM | POA: Insufficient documentation

## 2023-06-18 DIAGNOSIS — R0609 Other forms of dyspnea: Secondary | ICD-10-CM | POA: Insufficient documentation

## 2023-06-18 DIAGNOSIS — Z6833 Body mass index (BMI) 33.0-33.9, adult: Secondary | ICD-10-CM | POA: Diagnosis not present

## 2023-06-18 DIAGNOSIS — E785 Hyperlipidemia, unspecified: Secondary | ICD-10-CM | POA: Insufficient documentation

## 2023-06-18 DIAGNOSIS — I2510 Atherosclerotic heart disease of native coronary artery without angina pectoris: Secondary | ICD-10-CM | POA: Insufficient documentation

## 2023-06-18 NOTE — Patient Instructions (Signed)
 Medication Instructions:  Your physician recommends that you continue on your current medications as directed. Please refer to the Current Medication list given to you today.  *If you need a refill on your cardiac medications before your next appointment, please call your pharmacy*  Lab Work: None ordered today. If you have labs (blood work) drawn today and your tests are completely normal, you will receive your results only by: MyChart Message (if you have MyChart) OR A paper copy in the mail If you have any lab test that is abnormal or we need to change your treatment, we will call you to review the results.  Testing/Procedures: None ordered today.  Follow-Up: At [facility], you and your health needs are our priority.  As part of our continuing mission to provide you with exceptional heart care, we have created designated Provider Care Teams.  These Care Teams include your primary Cardiologist (physician) and Advanced Practice Providers (APPs -  Physician Assistants and Nurse Practitioners) who all work together to provide you with the care you need, when you need it.  We recommend signing up for the patient portal called "MyChart".  Sign up information is provided on this After Visit Summary.  MyChart is used to connect with patients for Virtual Visits (Telemedicine).  Patients are able to view lab/test results, encounter notes, upcoming appointments, etc.  Non-urgent messages can be sent to your provider as well.   To learn more about what you can do with MyChart, go to [email or web address].    Your next appointment:   1 year(s)  The format for your next appointment:   In Person  Provider:   Olinda Bertrand, [facility]  Other Instructions Follow-up with pulmonary medicine.

## 2023-06-18 NOTE — Progress Notes (Signed)
 Cardiology Office Note:  .   Date:  06/18/2023  ID:  Cheyenne Gould, DOB 1945-08-25, MRN [id number] PCP:  Jonathon Neighbors, MD  Former Cardiology Providers: Zena High, APRN, FNP-C, Dr. Knox Perl   HeartCare Providers Cardiologist:  Olinda Bertrand, DO , [facility] (established care 07/23/2019  ) Electrophysiologist:  None  Click to update primary MD,subspecialty MD or APP then REFRESH:1}    Chief Complaint  Patient presents with   Nonobstructive atherosclerosis of coronary artery   Follow-up    History of Present Illness: .   Cheyenne Gould is a 78 y.o. 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   Referred to the practice for evaluation of shortness of breath.  Ischemic workup noted nonobstructive CAD. Given her chronic dyspnea she did undergo right heart catheterization in 2022 which noted normal mean PA pressures.  Patient continues to follow with pulmonary medicine given her history of ILD and rheumatoid arthritis.  For evaluation for pulmonary artery disease given her history of ILD and RA.    In March/April 2025 patient reached out with concerns for worsening shortness of breath.  Lab work was performed on 06/05/2023 which noted elevated D-dimers despite being corrected for age.  Patient went to the ED and CT PE study was negative for pulmonary embolism.  She presents today for follow-up.  Overall shortness of breath is improving but not completely resolved.  Patient did better with her inhalers which she has ran out of.  For reasons unknown they are not refilled.  She has not followed up with pulmonary medicine either.  She also complains of left side of the face being a little bit more swollen compared to the right but  she is also getting dental work done on that side.  She is following up with PCP with that regards.  She denies anginal chest pain or heart failure symptoms.  Echocardiogram from yesterday 06/17/2023 independently reviewed.  Review of Systems: .   Review of Systems  HENT:         Left sided of face mildly swollen - getting dental work.   Cardiovascular:  Positive for dyspnea on exertion (stable). Negative for chest pain, claudication, irregular heartbeat, leg swelling, near-syncope, orthopnea, palpitations, paroxysmal nocturnal dyspnea and syncope.  Respiratory:  Negative for shortness of breath.   Hematologic/Lymphatic: Negative for bleeding problem.    Studies Reviewed:    Echocardiogram: April 2025 1. Left ventricular ejection fraction, by estimation, is 60 to 65%. The  left ventricle has normal function. The left ventricle has no regional  wall motion abnormalities. Left ventricular diastolic parameters were  normal. The average left ventricular  global longitudinal strain is -24.8 %. The global longitudinal strain is  normal.   2. Right ventricular systolic function is normal. The right ventricular  size is normal.   3. A small pericardial effusion is present. There is no evidence of  cardiac tamponade.   4. The mitral valve is abnormal. Trivial mitral valve regurgitation. No  evidence of mitral stenosis.   5. The aortic valve is tricuspid. There is mild calcification of the  aortic valve. There is mild thickening of the aortic valve. Aortic valve  regurgitation is trivial. No aortic stenosis is present.    Stress Testing:  Exercise myoview  stress 04/26/2017: Exercised for 4 minutes and 50  seconds, achieved 4.6 METS, 97% of maximum predicted heart rate, low exercise capacity, stress ECG negative for ischemia. Stress and rest SPECT images demonstrate homogeneous tracer distribution throughout the myocardium. Gated SPECT imaging reveals normal myocardial thickening and wall motion.   LVEF 72%.  Low risk study.   Right and left heart catheterization 06/14/2020: Right heart catheterization: RA 1/4, mean -1 mmHg; RV 24/2, EDP 5 mmHg; PA 19/7, mean 13; PA saturation 76%.  PW 5/3, mean 2 mmHg; aortic saturation 100%. CO 7.42, CI 3.7 by Fick.  QP/QS 1.00. Left heart catheterization: LV: Normal LV systolic function without wall motion abnormality.  Normal EDP at 16 mmHg.  No pressure gradient across the aortic valve. Left main: Large vessel, mild coronary calcification noted. LAD: Severe extraluminal calcification noted on the proximal LAD.  Mid LAD has a 20 to 30% stenosis with moderate diffuse calcification.  Proximal large D1 has a 40 to 50% stenosis, again calcified. CX: Large caliber vessel.  Continues as a large OM 2.  Minimal luminal irregularity.  Mild calcification in the proximal segment. RI: Very small vessel with mild proximal calcification. RCA: Dominant, moderate size vessel, mild luminal irregularity, mild proximal coronary calcification.   Recommendation: Patient's symptoms of dyspnea are probably noncardiac.  No significant coronary disease.  25 to 30 mL contrast utilized.  RADIOLOGY: NA  Risk Assessment/Calculations:   NA   Labs:    External Labs: Collected: January 27, 2023 available in Care Everywhere. Hemoglobin 10.9 g/dL, hematocrit 16.1%. BUN 12, creatinine 1.23. eGFR 46. Sodium 142, potassium 4.4, chloride 103, bicarb 26. AST, ALT, alkaline phosphatase within normal limits.  External Labs: Collected: April 19, 2023 [facility] database. Total cholesterol 204, triglycerides 49, HDL 98, LDL 92  Physical Exam:    Today's Vitals   06/18/23 0803  BP: (!) 104/52  Pulse: 62  SpO2: 93%  Weight: 211 lb 12.8 oz (96.1 kg)  Height: 5\' 6"  (1.676 m)   Body mass index is 34.19 kg/m. Wt Readings from Last 3 Encounters:  06/18/23 211 lb 12.8 oz (96.1 kg)  02/01/23 206 lb 12.8 oz (93.8 kg)  07/03/22 209 lb 3.2 oz (94.9 kg)    Physical Exam   Constitutional: No distress.  Age appropriate, hemodynamically stable.   Neck: No JVD present.  Cardiovascular: Normal rate, regular rhythm, S1 normal, S2 normal, intact distal pulses and normal pulses. Exam reveals no gallop, no S3 and no S4.  No murmur heard. Pulmonary/Chest: Effort normal and breath sounds normal. No stridor. She has no wheezes. She has no rales.  Abdominal: Soft. Bowel sounds are normal. She exhibits no distension. There is no abdominal tenderness.  Musculoskeletal:        General: No edema.     Cervical back: Neck supple.  Neurological: She is alert and oriented to person, place, and time. She has intact cranial nerves (2-12).  Skin: Skin is warm and moist.     Impression & Recommendation(s):  Impression:   ICD-10-CM   1. Nonobstructive atherosclerosis of coronary artery  I25.10     2. Coronary artery calcification seen on CT scan  I25.10     3. Dyspnea on exertion  R06.09     4. Benign hypertension  I10     5. Mild hyperlipidemia  E78.5     6. Class 1 obesity due to excess calories with serious comorbidity and body mass index (BMI) of 33.0 to 33.9 in adult  E66.811    E66.09    Z68.33  Recommendation(s):  Nonobstructive atherosclerosis of coronary artery Coronary artery calcification seen on CT scan Chronic and stable. Denies anginal chest pain. Continue with lipid-lowering agents-simvastatin  and Zetia  Recent echocardiogram from June 17, 2023 independently reviewed, LVEF is preserved, no significant valvular heart disease. Reemphasized the importance of secondary prevention with focus on improving her modifiable cardiovascular risk factors such as glycemic control, lipid management, blood pressure control, weight loss.  Dyspnea on exertion Chronic and stable. Multifactorial-obesity/deconditioning, former smoker, running out of her inhalers, sees pulmonary medicine for ILD. Had labs done on 06/06/2026 which noted elevated D-dimers follow-up  CT PE study was negative for pulmonary embolism. Follow-up echocardiogram also reillustrates preserved LVEF and no significant valvular heart disease. I suspect a lot of her dyspnea on exertion is due to her pulmonary condition advised her to follow-up with pulmonary medicine and to restart her inhalers in the meantime.  Benign hypertension Office blood pressures are softer - by asymptomatic  Continue Diovan /HCTZ 160/12.5 half a tablet a day Continue Norvasc  5 mg p.o. daily. Continue Toprol -XL 50 mg p.o. daily Medications are currently being managed by PCP. Would recommend either changing Diovan /HCTZ to a lower dose or to a different agent as patient is only taking half a tablet.  Patient to discuss further with her prescribing physician, reencouraged again.  Mild hyperlipidemia Currently on simvastatin  10 mg p.o. daily as well as Zetia  10 mg p.o. daily. [facility] database reviewed most recent labs from February 2025 illustrate an LDL of 92 mg/dL. Reemphasized importance of reducing foods that are high in triglycerides and cholesterol. Patient states that the cholesterol is currently being checked by Dr. Nida Barrow.  Class 1 obesity due to excess calories with serious comorbidity and body mass index (BMI) of 34.0 to 34.9 in adult Body mass index is 34.19 kg/m. I reviewed with her importance of diet, regular physical activity/exercise, weight loss.   Patient is educated on the importance of increasing physical activity gradually as tolerated with a goal of moderate intensity exercise for 30 minutes a day 5 days a week.  Orders Placed:  No orders of the defined types were placed in this encounter.  As part of today's office visit discussed management of at least 2 chronic comorbid conditions, reviewed the echocardiogram from June 17, 2023, labs from June 06, 2023, and ER documentation from June 06, 2023.  Final Medication List:   No orders of the defined types were placed in this encounter.    There are no discontinued medications.   Current Outpatient Medications:    acetaminophen  (TYLENOL ) 500 MG tablet, Take 1,000 mg by mouth every 6 (six) hours as needed for mild pain or headache., Disp: , Rfl:    albuterol  (PROAIR  HFA) 108 (90 Base) MCG/ACT inhaler, Inhale 2 puffs into the lungs every 6 (six) hours as needed for wheezing or shortness of breath., Disp: 1 Inhaler, Rfl: 3   amLODipine  (NORVASC ) 5 MG tablet, Take 5 mg by mouth daily., Disp: , Rfl:    azelastine (ASTELIN) 0.1 % nasal spray, Place 2 sprays into both nostrils daily. Use in each nostril as directed, Disp: , Rfl:    budesonide -formoterol  (SYMBICORT ) 160-4.5 MCG/ACT inhaler, Inhale 2 puffs into the lungs 2 (two) times daily., Disp: 10.2 g, Rfl: 5   Cholecalciferol (VITAMIN D3) 2000 units TABS, Take 2,000 Units by mouth daily., Disp: , Rfl:    [facility] ASPIRIN  LOW DOSE 81 MG tablet, TAKE 1 TABLET (81 MG TOTAL) BY MOUTH DAILY. SWALLOW WHOLE., Disp: 90 tablet, Rfl: 3  EPIPEN 2-PAK 0.3 MG/0.3ML SOAJ injection, Inject 0.3 mg into the muscle as needed for anaphylaxis., Disp: , Rfl:    ezetimibe  (ZETIA ) 10 MG tablet, TAKE 1 TABLET BY MOUTH EVERY DAY, Disp: 90 tablet, Rfl: 0   folic acid  (FOLVITE ) 1 MG tablet, Take 2 mg by mouth daily., Disp: , Rfl: 3   lansoprazole (PREVACID) 15 MG capsule, Take 15 mg by mouth daily at 12 noon., Disp: , Rfl:    levocetirizine (XYZAL ) 5 MG tablet, Take 5 mg by mouth at bedtime. , Disp: , Rfl:    metoprolol  succinate (TOPROL -XL) 50 MG 24 hr tablet, TAKE 1 TABLET BY MOUTH EVERY DAY WITH OR IMMEDIATELY FOLLOWING A MEAL, Disp: 90 tablet, Rfl: 2   montelukast  (SINGULAIR ) 10 MG tablet, TAKE 1 TABLET BY MOUTH EVERYDAY AT BEDTIME, Disp: 90 tablet, Rfl: 3   PROLENSA 0.07 % SOLN, Apply to eye., Disp: , Rfl:    simvastatin  (ZOCOR ) 10 MG tablet, TAKE 1 TABLET BY MOUTH EVERYDAY AT BEDTIME, Disp: 90 tablet, Rfl: 3   Upadacitinib ER (RINVOQ) 15 MG TB24, 1 tablet Orally Once a day for 30 day(s), Disp: , Rfl:     valACYclovir  (VALTREX ) 500 MG tablet, Take 500 mg by mouth daily., Disp: , Rfl:    valsartan -hydrochlorothiazide  (DIOVAN -HCT) 160-12.5 MG tablet, Take 0.5 tablets by mouth daily., Disp: , Rfl:    XIIDRA 5 % SOLN, Apply 1 drop to eye 2 (two) times daily., Disp: , Rfl:    clindamycin  (CLEOCIN ) 300 MG capsule, Take 300 mg by mouth as needed (Pre dental procedures). (Patient not taking: Reported on 06/18/2023), Disp: , Rfl:    COVID-19 mRNA vaccine 2023-2024 (COMIRNATY ) syringe, Inject into the muscle., Disp: 0.3 mL, Rfl: 0   influenza vaccine adjuvanted (FLUAD QUADRIVALENT ) 0.5 ML injection, Inject into the muscle., Disp: 0.5 mL, Rfl: 0  Consent:   N/A  Disposition:   1 year follow-up sooner if needed  Patient may be asked to follow-up sooner based on the results of the above-mentioned testing.  Her questions and concerns were addressed to her satisfaction. She voices understanding of the recommendations provided during this encounter.    Signed, Olinda Bertrand, DO, [facility]  [facility]  [address] [place], [place] [address] 06/18/2023 8:26 AM

## 2023-06-19 ENCOUNTER — Other Ambulatory Visit: Payer: Self-pay | Admitting: Pulmonary Disease

## 2023-06-19 NOTE — Telephone Encounter (Unsigned)
 Copied from CRM [id number]. Topic: Clinical - Medication Refill >> Jun 19, 2023 12:28 PM Margarette Shawl wrote: Most Recent Primary Care Visit:   Medication: albuterol  (PROAIR  HFA) 108 (90 Base) MCG/ACT inhaler, budesonide -formoterol  (SYMBICORT ) 160-4.5 MCG/ACT inhaler   Has the patient contacted their pharmacy? Yes (Agent: If no, request that the patient contact the pharmacy for the refill. If patient does not wish to contact the pharmacy document the reason why and proceed with request.) (Agent: If yes, when and what did the pharmacy advise?)  Is this the correct pharmacy for this prescription? Yes If no, delete pharmacy and type the correct one.  This is the patient's preferred pharmacy:  [facility] [address] [place] - [address] [place] [address] Phone: [phone] Fax: [phone]   Has the prescription been filled recently? Yes  Is the patient out of the medication? Yes  Has the patient been seen for an appointment in the last year OR does the patient have an upcoming appointment? Yes  Can we respond through MyChart? Yes  Agent: Please be advised that Rx refills may take up to 3 business days. We ask that you follow-up with your pharmacy.

## 2023-06-22 ENCOUNTER — Other Ambulatory Visit: Payer: Self-pay | Admitting: Cardiology

## 2023-06-22 DIAGNOSIS — E785 Hyperlipidemia, unspecified: Secondary | ICD-10-CM

## 2023-06-22 DIAGNOSIS — I2510 Atherosclerotic heart disease of native coronary artery without angina pectoris: Secondary | ICD-10-CM

## 2023-06-24 ENCOUNTER — Encounter: Payer: Self-pay | Admitting: Family Medicine

## 2023-06-24 DIAGNOSIS — M940 Chondrocostal junction syndrome [Tietze]: Secondary | ICD-10-CM | POA: Diagnosis not present

## 2023-06-24 DIAGNOSIS — R221 Localized swelling, mass and lump, neck: Secondary | ICD-10-CM | POA: Diagnosis not present

## 2023-07-04 ENCOUNTER — Other Ambulatory Visit: Payer: Self-pay | Admitting: Family Medicine

## 2023-07-04 ENCOUNTER — Encounter: Payer: Self-pay | Admitting: Family Medicine

## 2023-07-04 DIAGNOSIS — R221 Localized swelling, mass and lump, neck: Secondary | ICD-10-CM

## 2023-07-18 ENCOUNTER — Ambulatory Visit
Admission: RE | Admit: 2023-07-18 | Discharge: 2023-07-18 | Disposition: A | Discharge status: Home / Self Care | Source: Ambulatory Visit | Attending: Family Medicine | Admitting: Family Medicine

## 2023-07-18 DIAGNOSIS — R221 Localized swelling, mass and lump, neck: Secondary | ICD-10-CM

## 2023-07-18 DIAGNOSIS — J358 Other chronic diseases of tonsils and adenoids: Secondary | ICD-10-CM | POA: Diagnosis not present

## 2023-07-18 MED ORDER — IOPAMIDOL (ISOVUE-300) INJECTION 61%
80.0000 mL | Freq: Once | INTRAVENOUS | Status: AC | PRN
Start: 2023-07-18 — End: 2023-07-18
  Administered 2023-07-18: 80 mL via INTRAVENOUS

## 2023-07-23 DIAGNOSIS — R0609 Other forms of dyspnea: Secondary | ICD-10-CM | POA: Diagnosis not present

## 2023-07-23 DIAGNOSIS — Z6834 Body mass index (BMI) 34.0-34.9, adult: Secondary | ICD-10-CM | POA: Diagnosis not present

## 2023-07-23 DIAGNOSIS — L401 Generalized pustular psoriasis: Secondary | ICD-10-CM | POA: Diagnosis not present

## 2023-07-23 DIAGNOSIS — M25512 Pain in left shoulder: Secondary | ICD-10-CM | POA: Diagnosis not present

## 2023-07-23 DIAGNOSIS — M5136 Other intervertebral disc degeneration, lumbar region with discogenic back pain only: Secondary | ICD-10-CM | POA: Diagnosis not present

## 2023-07-23 DIAGNOSIS — L4059 Other psoriatic arthropathy: Secondary | ICD-10-CM | POA: Diagnosis not present

## 2023-07-23 DIAGNOSIS — I82890 Acute embolism and thrombosis of other specified veins: Secondary | ICD-10-CM | POA: Diagnosis not present

## 2023-07-23 DIAGNOSIS — Z111 Encounter for screening for respiratory tuberculosis: Secondary | ICD-10-CM | POA: Diagnosis not present

## 2023-07-23 DIAGNOSIS — R0602 Shortness of breath: Secondary | ICD-10-CM | POA: Diagnosis not present

## 2023-07-23 DIAGNOSIS — M1991 Primary osteoarthritis, unspecified site: Secondary | ICD-10-CM | POA: Diagnosis not present

## 2023-07-23 DIAGNOSIS — M0589 Other rheumatoid arthritis with rheumatoid factor of multiple sites: Secondary | ICD-10-CM | POA: Diagnosis not present

## 2023-07-23 DIAGNOSIS — E669 Obesity, unspecified: Secondary | ICD-10-CM | POA: Diagnosis not present

## 2023-07-24 DIAGNOSIS — H35363 Drusen (degenerative) of macula, bilateral: Secondary | ICD-10-CM | POA: Diagnosis not present

## 2023-07-24 DIAGNOSIS — H04123 Dry eye syndrome of bilateral lacrimal glands: Secondary | ICD-10-CM | POA: Diagnosis not present

## 2023-07-24 DIAGNOSIS — H35033 Hypertensive retinopathy, bilateral: Secondary | ICD-10-CM | POA: Diagnosis not present

## 2023-07-29 DIAGNOSIS — L403 Pustulosis palmaris et plantaris: Secondary | ICD-10-CM | POA: Diagnosis not present

## 2023-08-05 ENCOUNTER — Other Ambulatory Visit: Payer: Self-pay | Admitting: Cardiology

## 2023-08-05 ENCOUNTER — Other Ambulatory Visit: Payer: Self-pay | Admitting: Pulmonary Disease

## 2023-08-08 ENCOUNTER — Other Ambulatory Visit: Payer: Self-pay

## 2023-08-08 ENCOUNTER — Emergency Department (HOSPITAL_COMMUNITY)

## 2023-08-08 ENCOUNTER — Emergency Department (HOSPITAL_COMMUNITY)
Admission: EM | Admit: 2023-08-08 | Discharge: 2023-08-08 | Disposition: A | Discharge status: Home / Self Care | Attending: Emergency Medicine | Admitting: Emergency Medicine

## 2023-08-08 ENCOUNTER — Encounter (HOSPITAL_COMMUNITY): Payer: Self-pay | Admitting: Emergency Medicine

## 2023-08-08 ENCOUNTER — Ambulatory Visit (HOSPITAL_COMMUNITY): Admission: EM | Admit: 2023-08-08 | Discharge: 2023-08-08 | Disposition: A | Discharge status: Home / Self Care

## 2023-08-08 DIAGNOSIS — N390 Urinary tract infection, site not specified: Secondary | ICD-10-CM | POA: Insufficient documentation

## 2023-08-08 DIAGNOSIS — R55 Syncope and collapse: Secondary | ICD-10-CM

## 2023-08-08 DIAGNOSIS — S8001XA Contusion of right knee, initial encounter: Secondary | ICD-10-CM | POA: Diagnosis not present

## 2023-08-08 DIAGNOSIS — Z7982 Long term (current) use of aspirin: Secondary | ICD-10-CM | POA: Diagnosis not present

## 2023-08-08 DIAGNOSIS — S0993XA Unspecified injury of face, initial encounter: Secondary | ICD-10-CM | POA: Diagnosis not present

## 2023-08-08 DIAGNOSIS — S299XXA Unspecified injury of thorax, initial encounter: Secondary | ICD-10-CM | POA: Diagnosis not present

## 2023-08-08 DIAGNOSIS — S01511A Laceration without foreign body of lip, initial encounter: Secondary | ICD-10-CM | POA: Diagnosis not present

## 2023-08-08 DIAGNOSIS — Z23 Encounter for immunization: Secondary | ICD-10-CM | POA: Insufficient documentation

## 2023-08-08 DIAGNOSIS — R29818 Other symptoms and signs involving the nervous system: Secondary | ICD-10-CM | POA: Diagnosis not present

## 2023-08-08 DIAGNOSIS — I700 Atherosclerosis of aorta: Secondary | ICD-10-CM | POA: Diagnosis not present

## 2023-08-08 DIAGNOSIS — R0789 Other chest pain: Secondary | ICD-10-CM | POA: Insufficient documentation

## 2023-08-08 DIAGNOSIS — I2510 Atherosclerotic heart disease of native coronary artery without angina pectoris: Secondary | ICD-10-CM | POA: Diagnosis not present

## 2023-08-08 DIAGNOSIS — Z79899 Other long term (current) drug therapy: Secondary | ICD-10-CM | POA: Diagnosis not present

## 2023-08-08 DIAGNOSIS — S0990XA Unspecified injury of head, initial encounter: Secondary | ICD-10-CM | POA: Diagnosis not present

## 2023-08-08 DIAGNOSIS — I10 Essential (primary) hypertension: Secondary | ICD-10-CM | POA: Insufficient documentation

## 2023-08-08 DIAGNOSIS — Z91010 Allergy to peanuts: Secondary | ICD-10-CM | POA: Insufficient documentation

## 2023-08-08 DIAGNOSIS — W0110XA Fall on same level from slipping, tripping and stumbling with subsequent striking against unspecified object, initial encounter: Secondary | ICD-10-CM | POA: Diagnosis not present

## 2023-08-08 DIAGNOSIS — Z0289 Encounter for other administrative examinations: Secondary | ICD-10-CM

## 2023-08-08 LAB — URINALYSIS, ROUTINE W REFLEX MICROSCOPIC
Bilirubin Urine: NEGATIVE
Glucose, UA: >=500 mg/dL — AB
Ketones, ur: NEGATIVE mg/dL
Nitrite: NEGATIVE
Protein, ur: NEGATIVE mg/dL
Specific Gravity, Urine: 1.018 (ref 1.005–1.030)
WBC, UA: >50 WBC/hpf (ref 0–5)
pH: 5 (ref 5.0–8.0)

## 2023-08-08 LAB — CBC WITH DIFFERENTIAL/PLATELET
Abs Immature Granulocytes: 0.06 10*3/uL (ref 0.00–0.07)
Basophils Absolute: 0 10*3/uL (ref 0.0–0.1)
Basophils Relative: 0 %
Eosinophils Absolute: 0.1 10*3/uL (ref 0.0–0.5)
Eosinophils Relative: 1 %
HCT: 40.2 % (ref 36.0–46.0)
Hemoglobin: 12.3 g/dL (ref 12.0–15.0)
Immature Granulocytes: 1 %
Lymphocytes Relative: 10 %
Lymphs Abs: 1.1 10*3/uL (ref 0.7–4.0)
MCH: 28.5 pg (ref 26.0–34.0)
MCHC: 30.6 g/dL (ref 30.0–36.0)
MCV: 93.3 fL (ref 80.0–100.0)
Monocytes Absolute: 0.8 10*3/uL (ref 0.1–1.0)
Monocytes Relative: 7 %
Neutro Abs: 9.3 10*3/uL — ABNORMAL HIGH (ref 1.7–7.7)
Neutrophils Relative %: 81 %
Platelets: 361 10*3/uL (ref 150–400)
RBC: 4.31 MIL/uL (ref 3.87–5.11)
RDW: 14.9 % (ref 11.5–15.5)
WBC: 11.4 10*3/uL — ABNORMAL HIGH (ref 4.0–10.5)
nRBC: 0 % (ref 0.0–0.2)

## 2023-08-08 LAB — COMPREHENSIVE METABOLIC PANEL WITH GFR
ALT: 11 U/L (ref 0–44)
AST: 26 U/L (ref 15–41)
Albumin: 4 g/dL (ref 3.5–5.0)
Alkaline Phosphatase: 63 U/L (ref 38–126)
Anion gap: 13 (ref 5–15)
BUN: 13 mg/dL (ref 8–23)
CO2: 22 mmol/L (ref 22–32)
Calcium: 9.9 mg/dL (ref 8.9–10.3)
Chloride: 103 mmol/L (ref 98–111)
Creatinine, Ser: 1.53 mg/dL — ABNORMAL HIGH (ref 0.44–1.00)
GFR, Estimated: 35 mL/min — ABNORMAL LOW (ref >60)
Glucose, Bld: 107 mg/dL — ABNORMAL HIGH (ref 70–99)
Potassium: 4.6 mmol/L (ref 3.5–5.1)
Sodium: 138 mmol/L (ref 135–145)
Total Bilirubin: 0.5 mg/dL (ref 0.0–1.2)
Total Protein: 7.7 g/dL (ref 6.5–8.1)

## 2023-08-08 LAB — APTT: aPTT: 24 s (ref 24–36)

## 2023-08-08 LAB — PROTIME-INR
INR: 1 (ref 0.8–1.2)
Prothrombin Time: 13.8 s (ref 11.4–15.2)

## 2023-08-08 LAB — I-STAT CHEM 8, ED
BUN: 16 mg/dL (ref 8–23)
Calcium, Ion: 1.14 mmol/L — ABNORMAL LOW (ref 1.15–1.40)
Chloride: 104 mmol/L (ref 98–111)
Creatinine, Ser: 1.5 mg/dL — ABNORMAL HIGH (ref 0.44–1.00)
Glucose, Bld: 114 mg/dL — ABNORMAL HIGH (ref 70–99)
HCT: 41 % (ref 36.0–46.0)
Hemoglobin: 13.9 g/dL (ref 12.0–15.0)
Potassium: 4.2 mmol/L (ref 3.5–5.1)
Sodium: 138 mmol/L (ref 135–145)
TCO2: 23 mmol/L (ref 22–32)

## 2023-08-08 LAB — ETHANOL: Alcohol, Ethyl (B): <15 mg/dL (ref <15)

## 2023-08-08 LAB — TROPONIN I (HIGH SENSITIVITY)
Troponin I (High Sensitivity): 7 ng/L (ref <18)
Troponin I (High Sensitivity): 6 ng/L (ref <18)

## 2023-08-08 MED ORDER — SODIUM CHLORIDE 0.9% FLUSH
3.0000 mL | Freq: Once | INTRAVENOUS | Status: AC
  Administered 2023-08-08: 3 mL via INTRAVENOUS

## 2023-08-08 MED ORDER — NITROFURANTOIN MONOHYD MACRO 100 MG PO CAPS: 100.0000 mg | ORAL_CAPSULE | Freq: Two times a day (BID) | ORAL | 0 refills | Status: DC

## 2023-08-08 MED ORDER — SODIUM CHLORIDE 0.9 % IV SOLN
1.0000 g | Freq: Once | INTRAVENOUS | Status: AC
  Administered 2023-08-08: 1 g via INTRAVENOUS
  Filled 2023-08-08: qty 10

## 2023-08-08 MED ORDER — SODIUM CHLORIDE 0.9 % IV BOLUS
1000.0000 mL | Freq: Once | INTRAVENOUS | Status: AC
  Administered 2023-08-08: 1000 mL via INTRAVENOUS

## 2023-08-08 MED ORDER — TETANUS-DIPHTH-ACELL PERTUSSIS 5-2.5-18.5 LF-MCG/0.5 IM SUSY
0.5000 mL | PREFILLED_SYRINGE | Freq: Once | INTRAMUSCULAR | Status: AC
  Administered 2023-08-08: 0.5 mL via INTRAMUSCULAR
  Filled 2023-08-08: qty 0.5

## 2023-08-08 MED ORDER — IOHEXOL 350 MG/ML SOLN
60.0000 mL | Freq: Once | INTRAVENOUS | Status: AC | PRN
  Administered 2023-08-08: 60 mL via INTRAVENOUS

## 2023-08-08 MED ORDER — MORPHINE SULFATE (PF) 4 MG/ML IV SOLN
4.0000 mg | Freq: Once | INTRAVENOUS | Status: AC
  Administered 2023-08-08: 4 mg via INTRAVENOUS
  Filled 2023-08-08: qty 1

## 2023-08-08 NOTE — ED Provider Notes (Signed)
 [place] [facility] Provider Note   CSN: [id number] Arrival date & time: 08/08/23  0941     Patient presents with: Loss of Consciousness, Mouth Injury, Shoulder Pain, and Stroke Symptoms   Cheyenne Gould is a 78 y.o. female.   The history is provided by the patient and medical records. No language interpreter was used.  Loss of Consciousness Mouth Injury  Shoulder Pain    78 year old female history of hypertension, CAD, rheumatoid arthritis, hyperlipidemia, anemia presented to ER for evaluation of a syncopal episode.  Patient states this morning she got up perhaps a bit too quickly as she was going to another room to grab paper.  She then found herself on the floor with an apparent brief syncopal episode.  She did strike her face against the ground and states she knocked out both of her front teeth from the impact.  She also struck her right shoulder and her left chest against the ground as well as her right knee.  She is complaining of throbbing pain to those affected area.  She denies tongue biting or urinary or bowel incontinence.  She denies any history of seizure.  She denies any confusion.  States she felt fine prior to the event.  She did not endorse any precipitating symptoms prior to the fall.  She was able to ambulate afterward and able to get up on her own.  She is unsure her last tetanus status.  She does not endorse any urinary symptoms no chest pain aside from the site of the impact and no abdominal pain.  She is not on any blood thinner medication.  Prior to Admission medications   Medication Sig Start Date End Date Taking? Authorizing Provider  acetaminophen  (TYLENOL ) 500 MG tablet Take 1,000 mg by mouth every 6 (six) hours as needed for mild pain or headache.    [provider]  albuterol  (PROAIR  HFA) 108 (90 Base) MCG/ACT inhaler Inhale 2 puffs into the lungs every 6 (six) hours as needed for wheezing or shortness of breath.  06/28/17   Mannam, Praveen, MD  amLODipine  (NORVASC ) 5 MG tablet Take 5 mg by mouth daily. 04/06/19   [provider]  azelastine (ASTELIN) 0.1 % nasal spray Place 2 sprays into both nostrils daily. Use in each nostril as directed    [provider]  budesonide -formoterol  (SYMBICORT ) 160-4.5 MCG/ACT inhaler Inhale 2 puffs into the lungs 2 (two) times daily. 02/09/20   Mannam, Praveen, MD  Cholecalciferol (VITAMIN D3) 2000 units TABS Take 2,000 Units by mouth daily.    [provider]  [facility] ASPIRIN  LOW DOSE 81 MG tablet TAKE 1 TABLET (81 MG TOTAL) BY MOUTH DAILY. SWALLOW WHOLE. 02/26/22   Tolia, Sunit, DO  EPIPEN 2-PAK 0.3 MG/0.3ML SOAJ injection Inject 0.3 mg into the muscle as needed for anaphylaxis. 06/14/14   [provider]  ezetimibe  (ZETIA ) 10 MG tablet TAKE 1 TABLET BY MOUTH EVERY DAY 06/24/23   Tolia, Sunit, DO  folic acid  (FOLVITE ) 1 MG tablet Take 2 mg by mouth daily. 08/13/14   [provider]  lansoprazole (PREVACID) 15 MG capsule Take 15 mg by mouth daily at 12 noon.    [provider]  levocetirizine (XYZAL ) 5 MG tablet Take 5 mg by mouth at bedtime.  05/21/12   [provider]  metoprolol  succinate (TOPROL -XL) 50 MG 24 hr tablet TAKE 1 TABLET BY MOUTH EVERY DAY WITH OR IMMEDIATELY FOLLOWING A MEAL 08/06/23   Tolia, Sunit, DO  montelukast  (SINGULAIR ) 10 MG tablet TAKE 1 TABLET BY MOUTH EVERYDAY AT BEDTIME 08/28/22   Mannam, Praveen, MD  PROLENSA 0.07 % SOLN Apply to eye. 05/30/22   [provider]  simvastatin  (ZOCOR ) 10 MG tablet TAKE 1 TABLET BY MOUTH EVERYDAY AT BEDTIME 11/06/22   Tolia, Sunit, DO  Upadacitinib ER (RINVOQ) 15 MG TB24 1 tablet Orally Once a day for 30 day(s) 06/28/21   [provider]  valACYclovir  (VALTREX ) 500 MG tablet Take 500 mg by mouth daily.    [provider]  valsartan -hydrochlorothiazide  (DIOVAN -HCT) 160-12.5 MG tablet Take 0.5 tablets by mouth daily.    [provider]   XIIDRA 5 % SOLN Apply 1 drop to eye 2 (two) times daily. 04/26/22   [provider]    Allergies: Omeprazole, Peanut-containing drug products, Shellfish allergy, Ixekizumab, Penicillins, Sulfa antibiotics, Allegra [fexofenadine], Leflunomide , and Lyrica [pregabalin]    Review of Systems  Cardiovascular:  Positive for syncope.  All other systems reviewed and are negative.   Updated Vital Signs BP 129/65   Pulse 65   Temp 98 F (36.7 C)   Resp 16   SpO2 98%   Physical Exam Vitals and nursing note reviewed.  Constitutional:      General: She is not in acute distress.    Appearance: She is well-developed.  HENT:     Head: Normocephalic.     Comments: Bruising noted to both upper lower lip.  Shallow laceration noted to the mucosal aspect of the upper lip.  Dental fracture off front central incisors from Markedly decay dentition.  No malocclusion.  No tongue injury.  Bruising noted to lower lip.    Ears:     Comments: No hemotympanums    Nose:     Comments: No septal hematoma  Eyes:     Extraocular Movements: Extraocular movements intact.     Conjunctiva/sclera: Conjunctivae normal.     Pupils: Pupils are equal, round, and reactive to light.   Neck:     Comments: No midline spine tenderness Cardiovascular:     Rate and Rhythm: Normal rate and regular rhythm.     Pulses: Normal pulses.     Heart sounds: Normal heart sounds.  Pulmonary:     Effort: Pulmonary effort is normal.  Chest:     Chest wall: Tenderness (Tenderness to right upper shoulder and left anterior chest wall without any bruising no crepitus no emphysema noted.) present.  Abdominal:     Palpations: Abdomen is soft.     Tenderness: There is no abdominal tenderness.   Musculoskeletal:        General: Tenderness (Right knee: Ecchymosis noted to the anterior knee with normal flexion and extension and patella is located.  Mild tender to palpation) present.     Cervical back: Normal range of motion and  neck supple.   Skin:    Findings: No rash.   Neurological:     Mental Status: She is alert and oriented to person, place, and time.   Psychiatric:        Mood and Affect: Mood normal.     (all labs ordered are listed, but only abnormal results are displayed) Labs Reviewed  CBC WITH DIFFERENTIAL/PLATELET - Abnormal; Notable for the following components:      Result Value   WBC 11.4 (*)    Neutro Abs 9.3 (*)    All other components within normal limits  COMPREHENSIVE METABOLIC PANEL WITH GFR - Abnormal; Notable for the following components:  Glucose, Bld 107 (*)    Creatinine, Ser 1.53 (*)    GFR, Estimated 35 (*)    All other components within normal limits  URINALYSIS, ROUTINE W REFLEX MICROSCOPIC - Abnormal; Notable for the following components:   APPearance HAZY (*)    Glucose, UA >=500 (*)    Hgb urine dipstick SMALL (*)    Leukocytes,Ua LARGE (*)    Bacteria, UA RARE (*)    All other components within normal limits  I-STAT CHEM 8, ED - Abnormal; Notable for the following components:   Creatinine, Ser 1.50 (*)    Glucose, Bld 114 (*)    Calcium, Ion 1.14 (*)    All other components within normal limits  PROTIME-INR  APTT  ETHANOL  CBC  DIFFERENTIAL  CBG MONITORING, ED    EKG: None ED ECG REPORT   Date: 08/08/2023  Rate: 67  Rhythm: normal sinus rhythm  QRS Axis: normal  Intervals: normal  ST/T Wave abnormalities: nonspecific T wave changes  Conduction Disutrbances:none  Narrative Interpretation:   Old EKG Reviewed: unchanged  I have personally reviewed the EKG tracing and agree with the computerized printout as noted.   Radiology: CT HEAD WO CONTRAST Result Date: 08/08/2023 CLINICAL DATA:  Neuro deficit, concern for stroke. EXAM: CT HEAD WITHOUT CONTRAST TECHNIQUE: Contiguous axial images were obtained from the base of the skull through the vertex without intravenous contrast. RADIATION DOSE REDUCTION: This exam was performed according to the  departmental dose-optimization program which includes automated exposure control, adjustment of the mA and/or kV according to patient size and/or use of iterative reconstruction technique. COMPARISON:  CT maxillofacial 03/19/2012. FINDINGS: Brain: No acute intracranial hemorrhage. No CT evidence of acute infarct. No edema, mass effect, or midline shift. The basilar cisterns are patent. Ventricles: The ventricles are normal. Vascular: No hyperdense vessel or unexpected calcification. Skull: No acute or aggressive finding. Orbits: Right lens replacement. Sinuses: The visualized paranasal sinuses are clear. Other: Mastoid air cells are clear. IMPRESSION: No CT evidence of acute intracranial abnormality. Electronically Signed   By: Denny Flack M.D.   On: 08/08/2023 11:32     Procedures   Medications Ordered in the ED  sodium chloride  flush (NS) 0.9 % injection 3 mL (has no administration in time range)    Clinical Course as of 08/08/23 1506  Thu Aug 08, 2023  1459 Fall, got up too quick and went to the ground. Teeth out in front. FU CT and trop. Treat for UTI.  Dc home once stuff back.  [BH]    Clinical Course User Index [BH] Henderly, Britni A, PA-C                                 Medical Decision Making Amount and/or Complexity of Data Reviewed Labs: ordered. Radiology: ordered.  Risk Prescription drug management.   BP 128/79   Pulse 80   Temp 98 F (36.7 C)   Resp 16   SpO2 100%   28:69 PM  78 year old female history of hypertension, CAD, rheumatoid arthritis, hyperlipidemia, anemia presented to ER for evaluation of a syncopal episode.  Patient states this morning she got up perhaps a bit too quickly as she was going to another room to grab paper.  She then found herself on the floor with an apparent brief syncopal episode.  She did strike her face against the ground and states she knocked out both of her front teeth  from the impact.  She also struck her right shoulder and her  left chest against the ground as well as her right knee.  She is complaining of throbbing pain to those affected area.  She denies tongue biting or urinary or bowel incontinence.  She denies any history of seizure.  She denies any confusion.  States she felt fine prior to the event.  She did not endorse any precipitating symptoms prior to the fall.  She was able to ambulate afterward and able to get up on her own.  She is unsure her last tetanus status.  She does not endorse any urinary symptoms no chest pain aside from the site of the impact and no abdominal pain.  She is not on any blood thinner medication.  Exam noted for bruising of the perioral region including upper and lower lip with a shallow laceration to the mucosal aspect of the upper lip.  Dental fracture involving the top front central incisors.  No malocclusion.  Patient also has tenderness about her right shoulder, and left side of her chest.  Some bruising noted to right anterior knee with full range of motion.  No other injury noted.  She is alert and oriented x 4 and mentating appropriately.  No tongue injury.  Vital sign overall reassuring.  Given her significant injury, will obtain appropriate imaging for further assessment.  Workup initiated.  Patient given pain medication and will update tetanus.  -Labs ordered, independently viewed and interpreted by me.  Labs remarkable for UA shows signs of UTI.  Urine culture sent, will treat with Rocephin.  Creatinine is 1.5 similar to prior.  Labs otherwise reassuring. -The patient was maintained on a cardiac monitor.  I personally viewed and interpreted the cardiac monitored which showed an underlying rhythm of: NSR -Imaging independently viewed and interpreted by me and I agree with radiologist's interpretation.  Result remarkable for head CT scan unremarkable.  Maxillofacial CT as well as chest CT is currently pending. -This patient presents to the ED for concern of syncope, this involves an  extensive number of treatment options, and is a complaint that carries with it a high risk of complications and morbidity.  The differential diagnosis includes vasovagal, orthostatic hypotension, cardiac arrhythmia, acs, stroke, vertigo, anemia -Co morbidities that complicate the patient evaluation includes htn, anemia, cad -Treatment includes tdap, rocephin, ns, morphine  -Reevaluation of the patient after these medicines showed that the patient improved -PCP office notes or outside notes reviewed -Discussion with oncoming team who will f/u on imaging and reassess. -Escalation to admission/observation considered: dispo pending      Final diagnoses:  Syncope, unspecified syncope type  Minor head trauma  Contusion of right knee, initial encounter  Urinary tract infection without hematuria, site unspecified    ED Discharge Orders     None          Debbra Fairy, PA-C 08/08/23 1510    Burnette Carte, MD 08/08/23 [phone]

## 2023-08-08 NOTE — ED Notes (Signed)
 Patient called her sister who will be coming to pick her up to take her to the ED. Greeter made aware that patient's sister is coming for her and to keep an eye out for her when she arrives.

## 2023-08-08 NOTE — ED Triage Notes (Signed)
 Pt. Stated, I got up and went o turn to get something and I blacked out, I think I got up too quick without getting my barrins together. When I turned I face planted right on the wood floor. I was out for about 30 minutes. I knocked my front teeth out, ?broke my nose, Im havinf pain in the right shoulder. When I came to which was about 30 seconds I knew everything. I waited til about 800 and drove myself.

## 2023-08-08 NOTE — ED Provider Notes (Signed)
 Care seen from previous provider.  See note for full HPI.  In summation 78 year old went to stand and walk when she felt lightheaded and syncopized.  She knocked out her front teeth.  She has some swelling to the front of her face.  Workup thus far unremarkable.  Plan on follow-up on troponins.  If negative can DC home. Physical Exam  BP 132/73   Pulse 80   Temp 98.1 F (36.7 C) (Oral)   Resp 14   SpO2 98%   Physical Exam Vitals and nursing note reviewed.  Constitutional:      General: She is not in acute distress.    Appearance: She is well-developed. She is not ill-appearing.  HENT:     Head: Atraumatic.     Jaw: There is normal jaw occlusion.     Comments: No raccoon eyes, Battle sign.    Mouth/Throat:     Lips: Pink.     Comments: Missing upper dentition No drooling, dysphagia or trismus No obvious lacerations to suture   Eyes:     Pupils: Pupils are equal, round, and reactive to light.    Cardiovascular:     Rate and Rhythm: Normal rate.  Pulmonary:     Effort: No respiratory distress.  Abdominal:     General: There is no distension.   Musculoskeletal:        General: Normal range of motion.     Cervical back: Normal range of motion.     Comments: No bony tenderness, full range of motion, compartment soft   Skin:    General: Skin is warm and dry.   Neurological:     General: No focal deficit present.     Mental Status: She is alert.   Psychiatric:        Mood and Affect: Mood normal.     Procedures  Procedures Labs Reviewed  CBC WITH DIFFERENTIAL/PLATELET - Abnormal; Notable for the following components:      Result Value   WBC 11.4 (*)    Neutro Abs 9.3 (*)    All other components within normal limits  COMPREHENSIVE METABOLIC PANEL WITH GFR - Abnormal; Notable for the following components:   Glucose, Bld 107 (*)    Creatinine, Ser 1.53 (*)    GFR, Estimated 35 (*)    All other components within normal limits  URINALYSIS, ROUTINE W REFLEX  MICROSCOPIC - Abnormal; Notable for the following components:   APPearance HAZY (*)    Glucose, UA >=500 (*)    Hgb urine dipstick SMALL (*)    Leukocytes,Ua LARGE (*)    Bacteria, UA RARE (*)    All other components within normal limits  I-STAT CHEM 8, ED - Abnormal; Notable for the following components:   Creatinine, Ser 1.50 (*)    Glucose, Bld 114 (*)    Calcium, Ion 1.14 (*)    All other components within normal limits  URINE CULTURE  PROTIME-INR  APTT  ETHANOL  CBC  DIFFERENTIAL  CBG MONITORING, ED  TROPONIN I (HIGH SENSITIVITY)  TROPONIN I (HIGH SENSITIVITY)   CT Chest W Contrast Result Date: 08/08/2023 CLINICAL DATA:  Trauma, rib fractures suspected EXAM: CT CHEST WITH CONTRAST TECHNIQUE: Multidetector CT imaging of the chest was performed during intravenous contrast administration. RADIATION DOSE REDUCTION: This exam was performed according to the departmental dose-optimization program which includes automated exposure control, adjustment of the mA and/or kV according to patient size and/or use of iterative reconstruction technique. CONTRAST:  60mL OMNIPAQUE  IOHEXOL   350 MG/ML SOLN COMPARISON:  06/06/2023 FINDINGS: Cardiovascular: The heart is unremarkable without pericardial effusion. No evidence of vascular injury. Atherosclerosis of the aorta and coronary vasculature. Mediastinum/Nodes: No enlarged mediastinal, hilar, or axillary lymph nodes. Thyroid  gland, trachea, and esophagus demonstrate no significant findings. Lungs/Pleura: No acute airspace disease, effusion, or pneumothorax. Linear areas of consolidation in the right middle and left lower lobes consistent with subsegmental atelectasis or scarring. Central airways are patent. Upper Abdomen: No acute abnormality. Musculoskeletal: There are no acute displaced fractures. Reconstructed images demonstrate no additional findings. IMPRESSION: 1. No acute intrathoracic trauma. 2. Aortic Atherosclerosis (ICD10-I70.0). Coronary artery  atherosclerosis. Electronically Signed   By: Bobbye Burrow M.D.   On: 08/08/2023 16:18   CT Maxillofacial Wo Contrast Result Date: 08/08/2023 CLINICAL DATA:  Facial trauma, blunt. EXAM: CT MAXILLOFACIAL WITHOUT CONTRAST TECHNIQUE: Multidetector CT imaging of the maxillofacial structures was performed. Multiplanar CT image reconstructions were also generated. RADIATION DOSE REDUCTION: This exam was performed according to the departmental dose-optimization program which includes automated exposure control, adjustment of the mA and/or kV according to patient size and/or use of iterative reconstruction technique. COMPARISON:  Same-day head CT.  CT maxillofacial 03/19/2012. FINDINGS: Osseous: Maxilla: Intact. Nearly edentulous maxilla with associated chronic bone loss. Pterygoid Plates: Intact Zygomatic Arch: Intact Orbits: Intact Ethmoid: Intact Sphenoid: Intact Frontal:Intact Mandible: Intact. No condylar dislocation. Degenerative changes of the left mandibular condyle. Nasal: Intact Nasal Septum: Intact.  Rightward bowing of the nasal septum. Orbits: Globes are intact. Right lens replacement. Lenses are normally located. Normal appearance of the extraocular muscles and optic nerve sheath complexes. Normal appearance of the intraorbital fat. Sinuses: No significant mucosal thickening.  No air-fluid levels. Soft tissues: Negative. Limited intracranial: No significant or unexpected finding. IMPRESSION: No acute maxillofacial fracture. Degenerative changes of the left mandibular condyle. Electronically Signed   By: Denny Flack M.D.   On: 08/08/2023 15:52   CT HEAD WO CONTRAST Result Date: 08/08/2023 CLINICAL DATA:  Neuro deficit, concern for stroke. EXAM: CT HEAD WITHOUT CONTRAST TECHNIQUE: Contiguous axial images were obtained from the base of the skull through the vertex without intravenous contrast. RADIATION DOSE REDUCTION: This exam was performed according to the departmental dose-optimization program which  includes automated exposure control, adjustment of the mA and/or kV according to patient size and/or use of iterative reconstruction technique. COMPARISON:  CT maxillofacial 03/19/2012. FINDINGS: Brain: No acute intracranial hemorrhage. No CT evidence of acute infarct. No edema, mass effect, or midline shift. The basilar cisterns are patent. Ventricles: The ventricles are normal. Vascular: No hyperdense vessel or unexpected calcification. Skull: No acute or aggressive finding. Orbits: Right lens replacement. Sinuses: The visualized paranasal sinuses are clear. Other: Mastoid air cells are clear. IMPRESSION: No CT evidence of acute intracranial abnormality. Electronically Signed   By: Denny Flack M.D.   On: 08/08/2023 11:32   CT SOFT TISSUE NECK W CONTRAST Result Date: 07/30/2023 CLINICAL DATA:  Left facial and neck swelling over the last 6 months. EXAM: CT NECK WITH CONTRAST TECHNIQUE: Multidetector CT imaging of the neck was performed using the standard protocol following the bolus administration of intravenous contrast. RADIATION DOSE REDUCTION: This exam was performed according to the departmental dose-optimization program which includes automated exposure control, adjustment of the mA and/or kV according to patient size and/or use of iterative reconstruction technique. CONTRAST:  80mL ISOVUE -300 IOPAMIDOL  (ISOVUE -300) INJECTION 61% COMPARISON:  None Available. FINDINGS: Pharynx and larynx: No mucosal or submucosal lesion. Small low-density areas in the tonsils likely represent tonsillar cysts. There is  no enlargement or gross inflammatory change. Salivary glands: Parotid and submandibular glands are normal. Thyroid : Few small subcentimeter low-density areas which do not require further follow-up in a patient of this age. Lymph nodes: No lymphadenopathy on either side of the neck. Vascular: Carotid arteries are tortuous with mild atherosclerosis at the bifurcations but no likely stenosis. Limited  intracranial: Normal Visualized orbits: Normal Mastoids and visualized paranasal sinuses: Clear Skeleton: Chronic dental disease without advanced acute finding. 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 Upper chest: No acute finding. Other: None IMPRESSION: 1. No abnormality seen to explain the clinical presentation. No evidence of mass or lymphadenopathy. 2. Small low-density areas in the tonsils likely represent tonsillar cysts. No enlargement or gross inflammatory change. 3. Chronic dental disease without advanced acute finding. 4. 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 Electronically Signed   By: Bettylou Brunner M.D.   On: 07/30/2023 13:43    ED Course / MDM   Clinical Course as of 08/08/23 1935  Thu Aug 08, 2023  1459 Fall, got up too quick and went to the ground. Teeth out in front. FU CT and trop. Treat for UTI.  Dc home once stuff back.  [BH]    Clinical Course User Index [BH] Jamion Carter A, PA-C   Medical Decision Making Amount and/or Complexity of Data Reviewed Independent Historian: friend External Data Reviewed: labs, radiology, ECG and notes. Labs: ordered. Decision-making details documented in ED Course. Radiology: ordered and independent interpretation performed. Decision-making details documented in ED Course. ECG/medicine tests: ordered and independent interpretation performed. Decision-making details documented in ED Course.  Risk OTC drugs. Prescription drug management. Parenteral controlled substances. Decision regarding hospitalization. Diagnosis or treatment significantly limited by social determinants of health.    Care seen from previous provider.  See note for full HPI.  In summation 78 year old went to stand and walk when she felt lightheaded and syncopized.  She knocked out her front teeth.  She has some swelling to the front of her face.  Workup thus far unremarkable.  Plan on follow-up on  troponins.  If negative can DC home.  Labs and imaging personally viewed interpreted Urine positive for UTI, given Rocephin here.  Started on Macrobid.  No flank pain to suggest pyelonephritis.  Discussed follow-up with dentistry for her missing teeth  I suspect her syncope likely due to an episode of orthostatic hypotension causing her syncope.  She is able to ambulate here without difficulty.  I reviewed her prior echocardiogram with no reduced EF.  Will have her follow-up outpatient, return for new or worsening symptoms      Ezequias Lard A, PA-C 08/08/23 1935    Lowery Rue, DO 08/08/23 2247

## 2023-08-08 NOTE — ED Provider Notes (Signed)
 Presents after a fall this morning She was walking to get something and then was on the floor. She's not sure if she lost consciousness or not. She reports two front teeth are missing, pain in the mouth, nose, and right shoulder.  She is not on blood thinner  Patient has swelling of bilateral lips, damaged front teeth with no active bleeding at this time.  With fall and unknown LOC in 78 year old patient, advised evaluation in the emergency department. Requires higher level of care and advanced imaging not available in urgent care.  Patient sister will transport her to the ED across the parking lot via POV   Jacques Willingham, Beth Brooke 08/08/23 9562

## 2023-08-08 NOTE — ED Notes (Signed)
 Sister here to pick patient up to take to ED. Patient wheeled to car in wheelchair by this RN.

## 2023-08-08 NOTE — ED Notes (Signed)
 Pt ambulated to the restroom without difficulty and tolerated PO fluids.

## 2023-08-08 NOTE — ED Notes (Signed)
 Patient is being discharged from the Urgent Care and sent to the Emergency Department via POV with sister. Per [employer], PA, patient is in need of higher level of care due to syncopal episode and facial injury. Patient is aware and verbalizes understanding of plan of care.  Vitals:   08/08/23 0820  BP: 125/68  Pulse: 70  Resp: 18  Temp: 98.3 F (36.8 C)  SpO2: 100%

## 2023-08-08 NOTE — Discharge Instructions (Addendum)
 It was a pleasure taking care of you here today.  Your urine did show a possible infection.  We have started you on antibiotics.  Make sure to drink plenty of fluids at home.  Whenever you go from sitting to standing make shooting of your feet on the edge of the bed.  Follow-up with primary care provider in 1 to 2 days for reevaluation  Return for any new or worsening symptoms

## 2023-08-08 NOTE — ED Triage Notes (Signed)
 Pt believes may have gotten up to fast and passed out. Patient fell on face. Knocked out two front upper teeth. Lips are busted. Nose is sore. Pt reports takes a daily aspirin  but been out for a month

## 2023-08-08 NOTE — ED Notes (Signed)
 Pt called no answer

## 2023-08-08 NOTE — ED Notes (Signed)
 Patient back to room and on CCM and pulse ox monitor.

## 2023-08-10 LAB — URINE CULTURE

## 2023-08-20 ENCOUNTER — Encounter: Payer: Self-pay | Admitting: Nurse Practitioner

## 2023-08-20 ENCOUNTER — Ambulatory Visit (INDEPENDENT_AMBULATORY_CARE_PROVIDER_SITE_OTHER): Admitting: Nurse Practitioner

## 2023-08-20 VITALS — BP 110/60 | HR 63 | Ht 66.0 in | Wt 205.8 lb

## 2023-08-20 DIAGNOSIS — R0609 Other forms of dyspnea: Secondary | ICD-10-CM

## 2023-08-20 DIAGNOSIS — J849 Interstitial pulmonary disease, unspecified: Secondary | ICD-10-CM | POA: Diagnosis not present

## 2023-08-20 DIAGNOSIS — M069 Rheumatoid arthritis, unspecified: Secondary | ICD-10-CM | POA: Diagnosis not present

## 2023-08-20 DIAGNOSIS — R058 Other specified cough: Secondary | ICD-10-CM | POA: Insufficient documentation

## 2023-08-20 DIAGNOSIS — Z87891 Personal history of nicotine dependence: Secondary | ICD-10-CM | POA: Diagnosis not present

## 2023-08-20 DIAGNOSIS — J4540 Moderate persistent asthma, uncomplicated: Secondary | ICD-10-CM | POA: Diagnosis not present

## 2023-08-20 DIAGNOSIS — J45909 Unspecified asthma, uncomplicated: Secondary | ICD-10-CM | POA: Insufficient documentation

## 2023-08-20 MED ORDER — FLUTICASONE PROPIONATE 50 MCG/ACT NA SUSP: 2.0000 | Freq: Every day | NASAL | 2 refills | Status: DC

## 2023-08-20 MED ORDER — BUDESONIDE-FORMOTEROL FUMARATE 160-4.5 MCG/ACT IN AERO: 2.0000 | INHALATION_SPRAY | Freq: Two times a day (BID) | RESPIRATORY_TRACT | 5 refills | Status: AC

## 2023-08-20 MED ORDER — ALBUTEROL SULFATE HFA 108 (90 BASE) MCG/ACT IN AERS: 2.0000 | INHALATION_SPRAY | Freq: Four times a day (QID) | RESPIRATORY_TRACT | 2 refills | Status: DC | PRN

## 2023-08-20 NOTE — Assessment & Plan Note (Signed)
 Worsening symptoms of dyspnea and cough over the last 3-4 months. Lung exam clear. Has perceived benefit from prior inhaler therapy. Unable to complete exhaled nitric oxide  testing. Suspect a component of upper airway cough syndrome from postnasal drainage. Recent CT chest without acute process; chronic atelectasis/scarring and possibly some mild areas of btx. Will restart ICS/LABA therapy with Symbicort . Aware of side effect profile and proper inhaler technique. Action plan in place. Trigger prevention reviewed.   Patient Instructions  Restart Symbicort  2 puffs Twice daily. Brush tongue and rinse mouth afterwards Albuterol  inhaler 2 puffs every 6 hours as needed for shortness of breath or wheezing. Notify if symptoms persist despite rescue inhaler/neb use.  Continue xyzal  1 tab daily Continue singulair  1 tab daily  Start flonase  nasal spray 2 sprays each nostril daily for nasal congestion/postnasal drainage   Lung function testing at follow up  High resolution CT chest scan in 3 months  Follow up in 6 weeks after PFT with Dr. Theophilus or Izetta Malachy PIETY. If symptoms do not improve or worsen, please contact office for sooner follow up or seek emergency care.

## 2023-08-20 NOTE — Progress Notes (Signed)
 @Patient  ID: Cheyenne Gould, female    DOB: [birth year], 78 y.o.   MRN: [id number]  Chief Complaint  Patient presents with   Follow-up    No improvement in breathing and needs refill onn inhaler    Referring provider: Benjamine Aland, MD  HPI: 78 year old female, former smoker followed for asthma and concern for ILD in setting of RA/psoriatic arthritis. She is a patient of Dr. Jiles and last seen in office 06/01/2022. Past medical history significant for hypertension, CAD, superficial venous thrombosis of left lower extremity, RA on Rinvoq, palpitations, HLD.  TEST/EVENTS:   06/01/2022: OV with Dr. Theophilus.  Had prior CT imaging that showed mild centrilobular nodularity, suggestive of hypersensitivity pneumonitis.  No identifiable environmental trigger.  Follow-up imaging showed improvement in nodularity.  Follows with Dr. Mai for rheumatoid arthritis.  Under good control.  On Rinvoq.  Discontinued methotrexate in 2022.  Breathing doing well.  Stopped using inhalers and Singulair  in 2022.  Suspect underlying component of reactive airway disease or asthma given elevated peripheral eosinophils and IgE.  PFTs show a reduction in lung volumes secondary to body habitus with reduced ERV.  Does have mild increase in flow rate suggestive of airway reactivity.  Imaging not suggestive of RA ILD.  Advised to return as needed and continue to monitor.  08/20/2023: Today -follow-up Discussed the use of AI scribe software for clinical note transcription with the patient, who gave verbal consent to proceed.  History of Present Illness   Cheyenne Gould is a 78 year old female with rheumatoid arthritis and suspected asthma who presents with worsening breathing issues.  She has experienced worsening breathing issues over the past 3-4 months, with increased difficulty compared to her last visit over a year ago. She experiences shortness of breath even when walking from room to room some times, and struggles to  perform household activities. Some days no issues and other days are harder. She sometimes experiences a cough, which occasionally produces light green sputum. No wheezing, fever, chills, hemoptysis, or leg swelling. Her breathing issues may have worsened during allergy season. She does have postnasal drainage and sinus congestion.   She has a history of rheumatoid arthritis and is currently taking Rinvoq. She is not on prednisone . No recent prednisone  or abx. She previously used Symbicort  but has not been on it for a while and feels she now needs it again. She is using albuterol , which does seem to help but she is almost out. She is taking Xyzal  and Singulair  for allergies but is not using any nasal spray.   She was in the ED earlier this month, 6/19, after a fall with facial injury. She was found to have a UTI. She completed a course of Macrobid  for a urinary tract infection, and is feeling better. Facial trauma healing.   A CT chest scan from June 19th was performed; some linear consolidation in RML and LLL. She had a CTA in April when the SOB started, which showed no blood clots in the lungs. Her last echocardiogram 05/2023 showed some mild valve leakage and a small amount of fluid around the heart but otherwise unremarkable. She is followed by cardiology. She had a superficial blood clot in her left leg about a year ago but was not on blood thinners as it was superficial. No calf pain or swelling.   Unable to complete FeNO      Allergies  Allergen Reactions   Omeprazole Shortness Of Breath   Peanut-Containing Drug Products Anaphylaxis  Shellfish Allergy Anaphylaxis   Ixekizumab Swelling    At injection site  Taltz   Penicillins Other (See Comments)    Corners of mouth started splitting  Reaction: 30 years   Sulfa Antibiotics Other (See Comments)    Corners of mouth started splitting   Allegra [Fexofenadine] Cough   Leflunomide  Rash   Lyrica [Pregabalin] Other (See Comments)     Causes rheumatoid flares    Immunization History  Administered Date(s) Administered   Fluad  Quad(high Dose 65+) 10/23/2018, 10/21/2019, 01/16/2022   Influenza, High Dose Seasonal PF 11/10/2016, 12/02/2017   Influenza-Unspecified 10/20/2013   Moderna Covid-19 Vaccine Bivalent Booster 57yrs & up 12/20/2020   Moderna SARS-COV2 Booster Vaccination 08/02/2020   Moderna Sars-Covid-2 Vaccination 03/15/2019, 04/12/2019, 10/20/2019   Pfizer(Comirnaty )Fall Seasonal Vaccine 12 years and older 01/16/2022, 01/22/2023   Tdap 08/08/2023    Past Medical History:  Diagnosis Date   Acid reflux    AKI (acute kidney injury) (HCC) 06/07/2015   Allergic sinusitis    Anemia    CAP (community acquired pneumonia) 06/07/2015   Chest pain 01/01/2014   Coronary artery calcification    Hyperlipidemia    Hypertension    Hypoxia    Rheumatoid arthritis (HCC)    on Leflunomide     Rheumatoid arthritis(714.0)    on Leflunomide     Tobacco History: Social History   Tobacco Use  Smoking Status Former   Current packs/day: 0.00   Average packs/day: 1 pack/day for 40.0 years (40.0 ttl pk-yrs)   Types: Cigarettes   Start date: 02/20/1963   Quit date: 02/20/2003   Years since quitting: 20.5  Smokeless Tobacco Never   Counseling given: Not Answered   Outpatient Medications Prior to Visit  Medication Sig Dispense Refill   acetaminophen  (TYLENOL ) 500 MG tablet Take 1,000 mg by mouth every 6 (six) hours as needed for mild pain or headache.     amLODipine  (NORVASC ) 5 MG tablet Take 5 mg by mouth daily.     Cholecalciferol (VITAMIN D3) 2000 units TABS Take 2,000 Units by mouth daily.     [facility] ASPIRIN  LOW DOSE 81 MG tablet TAKE 1 TABLET (81 MG TOTAL) BY MOUTH DAILY. SWALLOW WHOLE. 90 tablet 3   EPIPEN 2-PAK 0.3 MG/0.3ML SOAJ injection Inject 0.3 mg into the muscle as needed for anaphylaxis.     ezetimibe  (ZETIA ) 10 MG tablet TAKE 1 TABLET BY MOUTH EVERY DAY 90 tablet 2   folic acid  (FOLVITE ) 1 MG tablet Take 2 mg  by mouth daily.  3   lansoprazole (PREVACID) 15 MG capsule Take 15 mg by mouth daily at 12 noon.     levocetirizine (XYZAL ) 5 MG tablet Take 5 mg by mouth at bedtime.      metoprolol  succinate (TOPROL -XL) 50 MG 24 hr tablet TAKE 1 TABLET BY MOUTH EVERY DAY WITH OR IMMEDIATELY FOLLOWING A MEAL 90 tablet 2   montelukast  (SINGULAIR ) 10 MG tablet TAKE 1 TABLET BY MOUTH EVERYDAY AT BEDTIME 90 tablet 3   PROLENSA 0.07 % SOLN Apply to eye.     simvastatin  (ZOCOR ) 10 MG tablet TAKE 1 TABLET BY MOUTH EVERYDAY AT BEDTIME 90 tablet 3   Upadacitinib ER (RINVOQ) 15 MG TB24 1 tablet Orally Once a day for 30 day(s)     valACYclovir  (VALTREX ) 500 MG tablet Take 500 mg by mouth daily.     valsartan -hydrochlorothiazide  (DIOVAN -HCT) 160-12.5 MG tablet Take 0.5 tablets by mouth daily.     XIIDRA 5 % SOLN Apply 1 drop to eye 2 (  two) times daily.     albuterol  (PROAIR  HFA) 108 (90 Base) MCG/ACT inhaler Inhale 2 puffs into the lungs every 6 (six) hours as needed for wheezing or shortness of breath. 1 Inhaler 3   azelastine (ASTELIN) 0.1 % nasal spray Place 2 sprays into both nostrils daily. Use in each nostril as directed     budesonide -formoterol  (SYMBICORT ) 160-4.5 MCG/ACT inhaler Inhale 2 puffs into the lungs 2 (two) times daily. 10.2 g 5   nitrofurantoin , macrocrystal-monohydrate, (MACROBID ) 100 MG capsule Take 1 capsule (100 mg total) by mouth 2 (two) times daily. 10 capsule 0   No facility-administered medications prior to visit.     Review of Systems:   Constitutional: No weight loss or gain, night sweats, fevers, chills, fatigue, or lassitude. HEENT: No headaches, difficulty swallowing, tooth/dental problems, or sore throat. No sneezing, itching, ear ache +nasal congestion, post nasal drip CV:  No chest pain, orthopnea, PND, swelling in lower extremities, anasarca, dizziness, palpitations, syncope Resp: + shortness of breath with exertion; productive cough. No excess mucus or change in color of mucus. No  productive or non-productive. No hemoptysis. No wheezing.  No chest wall deformity GI:  No heartburn, indigestion, abdominal pain, nausea, vomiting, diarrhea, change in bowel habits, loss of appetite, bloody stools.  GU: No dysuria, change in color of urine, urgency or frequency.  No flank pain, no hematuria  Skin: No rash, lesions, ulcerations MSK:  No joint pain or swelling.   Neuro: No dizziness or lightheadedness.  Psych: No depression or anxiety. Mood stable.     Physical Exam:  BP 110/60 (BP Location: Left Arm, Patient Position: Sitting, Cuff Size: Large)   Pulse 63   Ht 5' 6 (1.676 m)   Wt 205 lb 12.8 oz (93.4 kg)   SpO2 98%   BMI 33.22 kg/m   GEN: Pleasant, interactive, well-appearing; obese; in no acute distress HEENT:  Normocephalic and atraumatic. PERRLA. Sclera white. Nasal turbinates erythematous, moist and patent bilaterally. Clear rhinorrhea present. Oropharynx pink and moist, without exudate or edema. No lesions, ulcerations NECK:  Supple w/ fair ROM. No JVD present. Normal carotid impulses w/o bruits. Thyroid  symmetrical with no goiter or nodules palpated. No lymphadenopathy.   CV: RRR, no m/r/g, no peripheral edema. Pulses intact, +2 bilaterally. No cyanosis, pallor or clubbing. PULMONARY:  Unlabored, regular breathing. Clear bilaterally A&P w/o wheezes/rales/rhonchi. No accessory muscle use.  GI: BS present and normoactive. Soft, non-tender to palpation. No organomegaly or masses detected.  MSK: No erythema, warmth or tenderness. Cap refil <2 sec all extrem. No deformities or joint swelling noted.  Neuro: A/Ox3. No focal deficits noted.   Skin: Warm, no lesions or rashe Psych: Normal affect and behavior. Judgement and thought content appropriate.     Lab Results:  CBC    Component Value Date/Time   WBC 11.4 (H) 08/08/2023 1015   RBC 4.31 08/08/2023 1015   HGB 13.9 08/08/2023 1027   HGB 11.0 (L) 06/13/2020 1339   HGB 9.9 (L) 12/23/2012 1356   HCT 41.0  08/08/2023 1027   HCT 33.5 (L) 06/13/2020 1339   HCT 31.3 (L) 12/23/2012 1356   PLT 361 08/08/2023 1015   PLT 350 06/13/2020 1339   MCV 93.3 08/08/2023 1015   MCV 91 06/13/2020 1339   MCV 82.6 12/23/2012 1356   MCH 28.5 08/08/2023 1015   MCHC 30.6 08/08/2023 1015   RDW 14.9 08/08/2023 1015   RDW 14.1 06/13/2020 1339   RDW 16.4 (H) 12/23/2012 1356   LYMPHSABS 1.1  08/08/2023 1015   LYMPHSABS 2.1 12/23/2012 1356   MONOABS 0.8 08/08/2023 1015   MONOABS 0.7 12/23/2012 1356   EOSABS 0.1 08/08/2023 1015   EOSABS 1.2 (H) 12/23/2012 1356   BASOSABS 0.0 08/08/2023 1015   BASOSABS 0.1 12/23/2012 1356    BMET    Component Value Date/Time   NA 138 08/08/2023 1027   NA 139 06/05/2023 1313   NA 142 12/23/2012 1356   K 4.2 08/08/2023 1027   K 4.2 12/23/2012 1356   CL 104 08/08/2023 1027   CL 104 12/05/2011 0805   CO2 22 08/08/2023 1015   CO2 27 12/23/2012 1356   GLUCOSE 114 (H) 08/08/2023 1027   GLUCOSE 89 12/23/2012 1356   GLUCOSE 89 12/05/2011 0805   BUN 16 08/08/2023 1027   BUN 19 06/05/2023 1313   BUN 12.2 12/23/2012 1356   CREATININE 1.50 (H) 08/08/2023 1027   CREATININE 0.9 12/23/2012 1356   CALCIUM 9.9 08/08/2023 1015   CALCIUM 9.7 12/23/2012 1356   GFRNONAA 35 (L) 08/08/2023 1015   GFRAA 58 (L) 06/14/2015 0538    BNP No results found for: BNP   Imaging:  CT Chest W Contrast Result Date: 08/08/2023 CLINICAL DATA:  Trauma, rib fractures suspected EXAM: CT CHEST WITH CONTRAST TECHNIQUE: Multidetector CT imaging of the chest was performed during intravenous contrast administration. RADIATION DOSE REDUCTION: This exam was performed according to the departmental dose-optimization program which includes automated exposure control, adjustment of the mA and/or kV according to patient size and/or use of iterative reconstruction technique. CONTRAST:  60mL OMNIPAQUE  IOHEXOL  350 MG/ML SOLN COMPARISON:  06/06/2023 FINDINGS: Cardiovascular: The heart is unremarkable without  pericardial effusion. No evidence of vascular injury. Atherosclerosis of the aorta and coronary vasculature. Mediastinum/Nodes: No enlarged mediastinal, hilar, or axillary lymph nodes. Thyroid  gland, trachea, and esophagus demonstrate no significant findings. Lungs/Pleura: No acute airspace disease, effusion, or pneumothorax. Linear areas of consolidation in the right middle and left lower lobes consistent with subsegmental atelectasis or scarring. Central airways are patent. Upper Abdomen: No acute abnormality. Musculoskeletal: There are no acute displaced fractures. Reconstructed images demonstrate no additional findings. IMPRESSION: 1. No acute intrathoracic trauma. 2. Aortic Atherosclerosis (ICD10-I70.0). Coronary artery atherosclerosis. Electronically Signed   By: Ozell Daring M.D.   On: 08/08/2023 16:18   CT Maxillofacial Wo Contrast Result Date: 08/08/2023 CLINICAL DATA:  Facial trauma, blunt. EXAM: CT MAXILLOFACIAL WITHOUT CONTRAST TECHNIQUE: Multidetector CT imaging of the maxillofacial structures was performed. Multiplanar CT image reconstructions were also generated. RADIATION DOSE REDUCTION: This exam was performed according to the departmental dose-optimization program which includes automated exposure control, adjustment of the mA and/or kV according to patient size and/or use of iterative reconstruction technique. COMPARISON:  Same-day head CT.  CT maxillofacial 03/19/2012. FINDINGS: Osseous: Maxilla: Intact. Nearly edentulous maxilla with associated chronic bone loss. Pterygoid Plates: Intact Zygomatic Arch: Intact Orbits: Intact Ethmoid: Intact Sphenoid: Intact Frontal:Intact Mandible: Intact. No condylar dislocation. Degenerative changes of the left mandibular condyle. Nasal: Intact Nasal Septum: Intact.  Rightward bowing of the nasal septum. Orbits: Globes are intact. Right lens replacement. Lenses are normally located. Normal appearance of the extraocular muscles and optic nerve sheath  complexes. Normal appearance of the intraorbital fat. Sinuses: No significant mucosal thickening.  No air-fluid levels. Soft tissues: Negative. Limited intracranial: No significant or unexpected finding. IMPRESSION: No acute maxillofacial fracture. Degenerative changes of the left mandibular condyle. Electronically Signed   By: Donnice Mania M.D.   On: 08/08/2023 15:52   CT HEAD WO CONTRAST Result  Date: 08/08/2023 CLINICAL DATA:  Neuro deficit, concern for stroke. EXAM: CT HEAD WITHOUT CONTRAST TECHNIQUE: Contiguous axial images were obtained from the base of the skull through the vertex without intravenous contrast. RADIATION DOSE REDUCTION: This exam was performed according to the departmental dose-optimization program which includes automated exposure control, adjustment of the mA and/or kV according to patient size and/or use of iterative reconstruction technique. COMPARISON:  CT maxillofacial 03/19/2012. FINDINGS: Brain: No acute intracranial hemorrhage. No CT evidence of acute infarct. No edema, mass effect, or midline shift. The basilar cisterns are patent. Ventricles: The ventricles are normal. Vascular: No hyperdense vessel or unexpected calcification. Skull: No acute or aggressive finding. Orbits: Right lens replacement. Sinuses: The visualized paranasal sinuses are clear. Other: Mastoid air cells are clear. IMPRESSION: No CT evidence of acute intracranial abnormality. Electronically Signed   By: Donnice Mania M.D.   On: 08/08/2023 11:32    Administration History     None          Latest Ref Rng & Units 04/05/2020    8:52 AM 02/17/2019   10:52 AM 06/28/2017   12:14 PM  PFT Results  FVC-Pre L 1.85  2.00  1.80   FVC-Predicted Pre % 74  77  69   FVC-Post L 2.02   1.76   FVC-Predicted Post % 80   67   Pre FEV1/FVC % % 86  87  87   Post FEV1/FCV % % 89   90   FEV1-Pre L 1.60  1.74  1.57   FEV1-Predicted Pre % 82  87  78   FEV1-Post L 1.79   1.58   DLCO uncorrected ml/min/mmHg 14.10   15.80  14.80   DLCO UNC% % 68  76  54   DLCO corrected ml/min/mmHg 14.10     DLCO COR %Predicted % 68     DLVA Predicted % 102  126  83   TLC L 3.45   3.71   TLC % Predicted % 64   69   RV % Predicted % 65   75     Lab Results  Component Value Date   NITRICOXIDE 9 06/28/2017        Assessment & Plan:   Reactive airway disease Worsening symptoms of dyspnea and cough over the last 3-4 months. Lung exam clear. Has perceived benefit from prior inhaler therapy. Unable to complete exhaled nitric oxide  testing. Suspect a component of upper airway cough syndrome from postnasal drainage. Recent CT chest without acute process; chronic atelectasis/scarring and possibly some mild areas of btx. Will restart ICS/LABA therapy with Symbicort . Aware of side effect profile and proper inhaler technique. Action plan in place. Trigger prevention reviewed.   Patient Instructions  Restart Symbicort  2 puffs Twice daily. Brush tongue and rinse mouth afterwards Albuterol  inhaler 2 puffs every 6 hours as needed for shortness of breath or wheezing. Notify if symptoms persist despite rescue inhaler/neb use.  Continue xyzal  1 tab daily Continue singulair  1 tab daily  Start flonase  nasal spray 2 sprays each nostril daily for nasal congestion/postnasal drainage   Lung function testing at follow up  High resolution CT chest scan in 3 months  Follow up in 6 weeks after PFT with Dr. Theophilus or Izetta Malachy PIETY. If symptoms do not improve or worsen, please contact office for sooner follow up or seek emergency care.    ILD (interstitial lung disease) (HCC) Hx of mild centrilobular emphysema, some possible mild btx, chronic areas of atelectasis/scarring. Improved over time on  prior imaging. Progressive DOE over the last 3-4 months. See above plan. Will repeat PFTs to assess for evidence of decline in lung function. Repeat HRCT in 3 months to ensure she has not developed CT ILD, unless symptoms clinically  improve/resolve with inhaler therapy.   Rheumatoid arthritis (HCC) RA/psoriatic arthritis on Rinvoq. Follow up with Dr. Mai as scheduled.   Upper airway cough syndrome See above. Likely allergy induced. Hx of elevated peripheral eosinophils and IgE. Start intranasal steroid with flonase . Continue daily antihistamine. Reassess at follow up.    Advised if symptoms do not improve or worsen, to please contact office for sooner follow up or seek emergency care.   I spent 45 minutes of dedicated to the care of this patient on the date of this encounter to include pre-visit review of records, face-to-face time with the patient discussing conditions above, post visit ordering of testing, clinical documentation with the electronic health record, making appropriate referrals as documented, and communicating necessary findings to members of the patients care team.  Comer LULLA Rouleau, NP 08/20/2023  Pt aware and understands NP's role.

## 2023-08-20 NOTE — Assessment & Plan Note (Signed)
 RA/psoriatic arthritis on Rinvoq. Follow up with Dr. Mai as scheduled.

## 2023-08-20 NOTE — Assessment & Plan Note (Signed)
 See above. Likely allergy induced. Hx of elevated peripheral eosinophils and IgE. Start intranasal steroid with flonase . Continue daily antihistamine. Reassess at follow up.

## 2023-08-20 NOTE — Patient Instructions (Addendum)
 Restart Symbicort  2 puffs Twice daily. Brush tongue and rinse mouth afterwards Albuterol  inhaler 2 puffs every 6 hours as needed for shortness of breath or wheezing. Notify if symptoms persist despite rescue inhaler/neb use.  Continue xyzal  1 tab daily Continue singulair  1 tab daily  Start flonase  nasal spray 2 sprays each nostril daily for nasal congestion/postnasal drainage   Lung function testing at follow up  High resolution CT chest scan in 3 months  Follow up in 6 weeks after PFT with Dr. Theophilus or Cheyenne Gould. If symptoms do not improve or worsen, please contact office for sooner follow up or seek emergency care.

## 2023-08-20 NOTE — Assessment & Plan Note (Signed)
 Hx of mild centrilobular emphysema, some possible mild btx, chronic areas of atelectasis/scarring. Improved over time on prior imaging. Progressive DOE over the last 3-4 months. See above plan. Will repeat PFTs to assess for evidence of decline in lung function. Repeat HRCT in 3 months to ensure she has not developed CT ILD, unless symptoms clinically improve/resolve with inhaler therapy.

## 2023-09-11 DIAGNOSIS — M0589 Other rheumatoid arthritis with rheumatoid factor of multiple sites: Secondary | ICD-10-CM | POA: Diagnosis not present

## 2023-09-25 DIAGNOSIS — Z91013 Allergy to seafood: Secondary | ICD-10-CM | POA: Diagnosis not present

## 2023-09-25 DIAGNOSIS — R21 Rash and other nonspecific skin eruption: Secondary | ICD-10-CM | POA: Diagnosis not present

## 2023-09-25 DIAGNOSIS — R0602 Shortness of breath: Secondary | ICD-10-CM | POA: Diagnosis not present

## 2023-09-25 DIAGNOSIS — Z91010 Allergy to peanuts: Secondary | ICD-10-CM | POA: Diagnosis not present

## 2023-10-04 ENCOUNTER — Other Ambulatory Visit: Payer: Self-pay | Admitting: Pulmonary Disease

## 2023-10-04 ENCOUNTER — Other Ambulatory Visit: Payer: Self-pay | Admitting: Cardiology

## 2023-10-04 ENCOUNTER — Other Ambulatory Visit: Payer: Self-pay | Admitting: Nurse Practitioner

## 2023-10-04 DIAGNOSIS — R058 Other specified cough: Secondary | ICD-10-CM

## 2023-10-18 ENCOUNTER — Ambulatory Visit: Admitting: Nurse Practitioner

## 2023-10-18 ENCOUNTER — Ambulatory Visit: Admitting: Pulmonary Disease

## 2023-10-18 ENCOUNTER — Encounter: Payer: Self-pay | Admitting: Nurse Practitioner

## 2023-10-18 VITALS — BP 108/62 | HR 83 | Temp 98.3°F | Ht 66.0 in | Wt 199.0 lb

## 2023-10-18 DIAGNOSIS — M069 Rheumatoid arthritis, unspecified: Secondary | ICD-10-CM | POA: Diagnosis not present

## 2023-10-18 DIAGNOSIS — J4540 Moderate persistent asthma, uncomplicated: Secondary | ICD-10-CM | POA: Diagnosis not present

## 2023-10-18 DIAGNOSIS — J849 Interstitial pulmonary disease, unspecified: Secondary | ICD-10-CM | POA: Diagnosis not present

## 2023-10-18 DIAGNOSIS — J310 Chronic rhinitis: Secondary | ICD-10-CM | POA: Diagnosis not present

## 2023-10-18 DIAGNOSIS — R0609 Other forms of dyspnea: Secondary | ICD-10-CM

## 2023-10-18 LAB — PULMONARY FUNCTION TEST
DL/VA % pred: 77 %
DL/VA: 3.14 ml/min/mmHg/L
DLCO cor % pred: 50 %
DLCO cor: 10.22 ml/min/mmHg
DLCO unc % pred: 50 %
DLCO unc: 10.22 ml/min/mmHg
FEF 25-75 Post: 1.98 L/s
FEF 25-75 Pre: 2 L/s
FEF2575-%Change-Post: 0 %
FEF2575-%Pred-Post: 114 %
FEF2575-%Pred-Pre: 115 %
FEV1-%Change-Post: 0 %
FEV1-%Pred-Post: 69 %
FEV1-%Pred-Pre: 68 %
FEV1-Post: 1.57 L
FEV1-Pre: 1.56 L
FEV1FVC-%Change-Post: 0 %
FEV1FVC-%Pred-Pre: 112 %
FEV6-%Change-Post: 0 %
FEV6-%Pred-Post: 64 %
FEV6-%Pred-Pre: 64 %
FEV6-Post: 1.86 L
FEV6-Pre: 1.86 L
FEV6FVC-%Pred-Post: 105 %
FEV6FVC-%Pred-Pre: 105 %
FVC-%Change-Post: 0 %
FVC-%Pred-Post: 61 %
FVC-%Pred-Pre: 61 %
FVC-Post: 1.86 L
FVC-Pre: 1.86 L
Post FEV1/FVC ratio: 84 %
Post FEV6/FVC ratio: 100 %
Pre FEV1/FVC ratio: 84 %
Pre FEV6/FVC Ratio: 100 %
RV % pred: 71 %
RV: 1.73 L
TLC % pred: 69 %
TLC: 3.71 L

## 2023-10-18 NOTE — Progress Notes (Signed)
 Full PFT performed today.

## 2023-10-18 NOTE — Progress Notes (Signed)
 @Patient  ID: Cheyenne Gould, female    DOB: 02/23/1945, 78 y.o.   MRN: [id number]  Chief Complaint  Patient presents with   Follow-up    PFT f/u Pt states inhalers have helped since LOV    Referring provider: Malachy Comer GAILS, NP  HPI: 78 year old female, former smoker followed for asthma and concern for ILD in setting of RA/psoriatic arthritis. She is a patient of Dr. Jiles and last seen in office 08/20/2023. Past medical history significant for hypertension, CAD, superficial venous thrombosis of left lower extremity, RA on Rinvoq, palpitations, HLD.  TEST/EVENTS:  10/18/2023 PFT: FVC 61, FEV1 68, ratio 84, TLC 69, DLCOcor 50. Moderate restriction with moderate diffusion defect  06/01/2022: OV with Dr. Theophilus.  Had prior CT imaging that showed mild centrilobular nodularity, suggestive of hypersensitivity pneumonitis.  No identifiable environmental trigger.  Follow-up imaging showed improvement in nodularity.  Follows with Dr. Mai for rheumatoid arthritis.  Under good control.  On Rinvoq.  Discontinued methotrexate in 2022.  Breathing doing well.  Stopped using inhalers and Singulair  in 2022.  Suspect underlying component of reactive airway disease or asthma given elevated peripheral eosinophils and IgE.  PFTs show a reduction in lung volumes secondary to body habitus with reduced ERV.  Does have mild increase in flow rate suggestive of airway reactivity.  Imaging not suggestive of RA ILD.  Advised to return as needed and continue to monitor.  08/20/2023:  SHERLEAN with Agusta Hackenberg NP Discussed the use of AI scribe software for clinical note transcription with the patient, who gave verbal consent to proceed. Cheyenne Gould is a 78 year old female with rheumatoid arthritis and suspected asthma who presents with worsening breathing issues. She has experienced worsening breathing issues over the past 3-4 months, with increased difficulty compared to her last visit over a year ago. She experiences shortness  of breath even when walking from room to room some times, and struggles to perform household activities. Some days no issues and other days are harder. She sometimes experiences a cough, which occasionally produces light green sputum. No wheezing, fever, chills, hemoptysis, or leg swelling. Her breathing issues may have worsened during allergy season. She does have postnasal drainage and sinus congestion.  She has a history of rheumatoid arthritis and is currently taking Rinvoq. She is not on prednisone . No recent prednisone  or abx. She previously used Symbicort  but has not been on it for a while and feels she now needs it again. She is using albuterol , which does seem to help but she is almost out. She is taking Xyzal  and Singulair  for allergies but is not using any nasal spray.  She was in the ED earlier this month, 6/19, after a fall with facial injury. She was found to have a UTI. She completed a course of Macrobid  for a urinary tract infection, and is feeling better. Facial trauma healing.  A CT chest scan from June 19th was performed; some linear consolidation in RML and LLL. She had a CTA in April when the SOB started, which showed no blood clots in the lungs. Her last echocardiogram 05/2023 showed some mild valve leakage and a small amount of fluid around the heart but otherwise unremarkable. She is followed by cardiology. She had a superficial blood clot in her left leg about a year ago but was not on blood thinners as it was superficial. No calf pain or swelling.  Unable to complete FeNO    10/18/2023: Today - follow up Discussed the use  of AI scribe software for clinical note transcription with the patient, who gave verbal consent to proceed.  History of Present Illness Cheyenne Gould is a 78 year old female who presents for follow up.  Her breathing has improved with the use of Symbicort  inhaler. She is currently using Symbicort , two puffs twice a day, and only uses her rescue inhaler,  albuterol , about twice a week, primarily when going up steps or inclines.   Her cough is mostly resolved, with minimal phlegm noted primarily in the mornings. She has not been using the nasal spray provided previously. No hemoptysis, wheezing, chest congestion, weight loss, anorexia, leg swelling, PND, orthopnea.   She had lung function testing performed which showed a moderate restriction with moderate diffusion defect. She has HRCT chest scheduled for October.      Allergies  Allergen Reactions   Omeprazole Shortness Of Breath   Peanut-Containing Drug Products Anaphylaxis   Shellfish Allergy Anaphylaxis   Ixekizumab Swelling    At injection site  Taltz   Penicillins Other (See Comments)    Corners of mouth started splitting  Reaction: 30 years   Sulfa Antibiotics Other (See Comments)    Corners of mouth started splitting   Allegra [Fexofenadine] Cough   Leflunomide  Rash   Lyrica [Pregabalin] Other (See Comments)    Causes rheumatoid flares    Immunization History  Administered Date(s) Administered   Fluad  Quad(high Dose 65+) 10/23/2018, 10/21/2019, 01/16/2022   INFLUENZA, HIGH DOSE SEASONAL PF 11/10/2016, 12/02/2017   Influenza-Unspecified 10/20/2013   Moderna Covid-19 Vaccine Bivalent Booster 67yrs & up 12/20/2020   Moderna SARS-COV2 Booster Vaccination 08/02/2020   Moderna Sars-Covid-2 Vaccination 03/15/2019, 04/12/2019, 10/20/2019   Pfizer(Comirnaty )Fall Seasonal Vaccine 12 years and older 01/16/2022, 01/22/2023   Tdap 08/08/2023    Past Medical History:  Diagnosis Date   Acid reflux    AKI (acute kidney injury) (HCC) 06/07/2015   Allergic sinusitis    Anemia    CAP (community acquired pneumonia) 06/07/2015   Chest pain 01/01/2014   Coronary artery calcification    Hyperlipidemia    Hypertension    Hypoxia    Rheumatoid arthritis (HCC)    on Leflunomide     Rheumatoid arthritis(714.0)    on Leflunomide     Tobacco History: Social History   Tobacco Use   Smoking Status Former   Current packs/day: 0.00   Average packs/day: 1 pack/day for 40.0 years (40.0 ttl pk-yrs)   Types: Cigarettes   Start date: 02/20/1963   Quit date: 02/20/2003   Years since quitting: 20.6  Smokeless Tobacco Never   Counseling given: Not Answered   Outpatient Medications Prior to Visit  Medication Sig Dispense Refill   acetaminophen  (TYLENOL ) 500 MG tablet Take 1,000 mg by mouth every 6 (six) hours as needed for mild pain or headache.     albuterol  (PROAIR  HFA) 108 (90 Base) MCG/ACT inhaler Inhale 2 puffs into the lungs every 6 (six) hours as needed for wheezing or shortness of breath. 8 g 2   amLODipine  (NORVASC ) 5 MG tablet Take 5 mg by mouth daily.     budesonide -formoterol  (SYMBICORT ) 160-4.5 MCG/ACT inhaler Inhale 2 puffs into the lungs 2 (two) times daily. 10.2 g 5   Cholecalciferol (VITAMIN D3) 2000 units TABS Take 2,000 Units by mouth daily.     [employer] ASPIRIN  LOW DOSE 81 MG tablet TAKE 1 TABLET (81 MG TOTAL) BY MOUTH DAILY. SWALLOW WHOLE. 90 tablet 3   EPIPEN 2-PAK 0.3 MG/0.3ML SOAJ injection Inject 0.3 mg into the  muscle as needed for anaphylaxis.     ezetimibe  (ZETIA ) 10 MG tablet TAKE 1 TABLET BY MOUTH EVERY DAY 90 tablet 2   fluticasone  (FLONASE ) 50 MCG/ACT nasal spray SPRAY 2 SPRAYS INTO EACH NOSTRIL EVERY DAY 48 mL 10   folic acid  (FOLVITE ) 1 MG tablet Take 2 mg by mouth daily.  3   lansoprazole (PREVACID) 15 MG capsule Take 15 mg by mouth daily at 12 noon.     loratadine  (CLARITIN ) 10 MG tablet Take 10 mg by mouth daily. (Patient taking differently: Take 10 mg by mouth daily.)     metoprolol  succinate (TOPROL -XL) 50 MG 24 hr tablet TAKE 1 TABLET BY MOUTH EVERY DAY WITH OR IMMEDIATELY FOLLOWING A MEAL 90 tablet 2   montelukast  (SINGULAIR ) 10 MG tablet TAKE 1 TABLET BY MOUTH EVERYDAY AT BEDTIME 90 tablet 3   PROLENSA 0.07 % SOLN Apply to eye.     simvastatin  (ZOCOR ) 10 MG tablet TAKE 1 TABLET BY MOUTH EVERYDAY AT BEDTIME 90 tablet 2   Upadacitinib ER  (RINVOQ) 15 MG TB24 1 tablet Orally Once a day for 30 day(s)     valACYclovir  (VALTREX ) 500 MG tablet Take 500 mg by mouth daily.     valsartan -hydrochlorothiazide  (DIOVAN -HCT) 160-12.5 MG tablet Take 0.5 tablets by mouth daily.     XIIDRA 5 % SOLN Apply 1 drop to eye 2 (two) times daily.     levocetirizine (XYZAL ) 5 MG tablet Take 5 mg by mouth at bedtime.  (Patient not taking: Reported on 10/18/2023)     No facility-administered medications prior to visit.     Review of Systems:   Constitutional: No weight loss or gain, night sweats, fevers, chills, fatigue, or lassitude. HEENT: No headaches, difficulty swallowing, tooth/dental problems, or sore throat. No sneezing, itching, ear ache +nasal congestion, post nasal drip CV:  No chest pain, orthopnea, PND, swelling in lower extremities, anasarca, dizziness, palpitations, syncope Resp: + shortness of breath with exertion (stable); minimal cough. No excess mucus or change in color of mucus. No productive or non-productive. No hemoptysis. No wheezing.  No chest wall deformity GI:  No heartburn, indigestion, loss of appetite MSK:  No joint pain or swelling.    Psych: No depression or anxiety. Mood stable.     Physical Exam:  BP 108/62   Pulse 83   Temp 98.3 F (36.8 C)   Ht 5' 6 (1.676 m)   Wt 199 lb (90.3 kg)   SpO2 98% Comment: RA  BMI 32.12 kg/m   GEN: Pleasant, interactive, well-appearing; obese; in no acute distress HEENT:  Normocephalic and atraumatic. PERRLA. Sclera white. Nasal turbinates erythematous, moist and patent bilaterally. No rhinorrhea present. Oropharynx pink and moist, without exudate or edema. No lesions, ulcerations NECK:  Supple w/ fair ROM. No JVD present. No lymphadenopathy.   CV: RRR, no m/r/g, no peripheral edema. Pulses intact, +2 bilaterally. No cyanosis, pallor or clubbing. PULMONARY:  Unlabored, regular breathing. Clear bilaterally A&P w/o wheezes/rales/rhonchi.  GI: BS present and normoactive. Soft,  non-tender to palpation.  MSK: No erythema, warmth or tenderness. Cap refil <2 sec all extrem.  Neuro: A/Ox3. No focal deficits noted.   Skin: Warm, no lesions or rashe Psych: Normal affect and behavior. Judgement and thought content appropriate.     Lab Results:  CBC    Component Value Date/Time   WBC 11.4 (H) 08/08/2023 1015   RBC 4.31 08/08/2023 1015   HGB 13.9 08/08/2023 1027   HGB 11.0 (L) 06/13/2020 1339  HGB 9.9 (L) 12/23/2012 1356   HCT 41.0 08/08/2023 1027   HCT 33.5 (L) 06/13/2020 1339   HCT 31.3 (L) 12/23/2012 1356   PLT 361 08/08/2023 1015   PLT 350 06/13/2020 1339   MCV 93.3 08/08/2023 1015   MCV 91 06/13/2020 1339   MCV 82.6 12/23/2012 1356   MCH 28.5 08/08/2023 1015   MCHC 30.6 08/08/2023 1015   RDW 14.9 08/08/2023 1015   RDW 14.1 06/13/2020 1339   RDW 16.4 (H) 12/23/2012 1356   LYMPHSABS 1.1 08/08/2023 1015   LYMPHSABS 2.1 12/23/2012 1356   MONOABS 0.8 08/08/2023 1015   MONOABS 0.7 12/23/2012 1356   EOSABS 0.1 08/08/2023 1015   EOSABS 1.2 (H) 12/23/2012 1356   BASOSABS 0.0 08/08/2023 1015   BASOSABS 0.1 12/23/2012 1356    BMET    Component Value Date/Time   NA 138 08/08/2023 1027   NA 139 06/05/2023 1313   NA 142 12/23/2012 1356   K 4.2 08/08/2023 1027   K 4.2 12/23/2012 1356   CL 104 08/08/2023 1027   CL 104 12/05/2011 0805   CO2 22 08/08/2023 1015   CO2 27 12/23/2012 1356   GLUCOSE 114 (H) 08/08/2023 1027   GLUCOSE 89 12/23/2012 1356   GLUCOSE 89 12/05/2011 0805   BUN 16 08/08/2023 1027   BUN 19 06/05/2023 1313   BUN 12.2 12/23/2012 1356   CREATININE 1.50 (H) 08/08/2023 1027   CREATININE 0.9 12/23/2012 1356   CALCIUM 9.9 08/08/2023 1015   CALCIUM 9.7 12/23/2012 1356   GFRNONAA 35 (L) 08/08/2023 1015   GFRAA 58 (L) 06/14/2015 0538    BNP No results found for: BNP   Imaging:  No results found.   Administration History     None          Latest Ref Rng & Units 10/18/2023    8:23 AM 04/05/2020    8:52 AM 02/17/2019    10:52 AM 06/28/2017   12:14 PM  PFT Results  FVC-Pre L 1.86  P 1.85  2.00  1.80   FVC-Predicted Pre % 61  P 74  77  69   FVC-Post L 1.86  P 2.02   1.76   FVC-Predicted Post % 61  P 80   67   Pre FEV1/FVC % % 84  P 86  87  87   Post FEV1/FCV % % 84  P 89   90   FEV1-Pre L 1.56  P 1.60  1.74  1.57   FEV1-Predicted Pre % 68  P 82  87  78   FEV1-Post L 1.57  P 1.79   1.58   DLCO uncorrected ml/min/mmHg 10.22  P 14.10  15.80  14.80   DLCO UNC% % 50  P 68  76  54   DLCO corrected ml/min/mmHg 10.22  P 14.10     DLCO COR %Predicted % 50  P 68     DLVA Predicted % 77  P 102  126  83   TLC L 3.71  P 3.45   3.71   TLC % Predicted % 69  P 64   69   RV % Predicted % 71  P 65   75     P Preliminary result    Lab Results  Component Value Date   NITRICOXIDE 9 06/28/2017        Assessment & Plan:   No problem-specific Assessment & Plan notes found for this encounter.  Assessment and Plan Assessment & Plan Restrictive  lung disease  Moderate restriction in lung function with reduced gas exchange. Component related to body habitus with reduced ERV. Improvement in breathing with inhaler use. Concern for RA ILD. Awaiting high-resolution CT scan for further evaluation.  - Continue Symbicort  two puffs twice a day - Use albuterol  as needed, especially if experiencing increased symptoms - Order high-resolution CT scan on October 1st - Schedule follow-up appointment in seven weeks to review CT results  Rheumatoid arthritis with possible pulmonary involvement Potential pulmonary involvement of rheumatoid arthritis affecting lung function. Previous CT scan in June with linear atelectasis or scarring. Awaiting further evaluation with upcoming HRCT chest.  Postnasal drip Morning phlegm likely due to postnasal drainage. Not currently using nasal spray.  - Start Flonase  nasal spray, especially at bedtime, to alleviate morning phlegm  Reactive airway disease Constellation of symptoms consistent  with small airways disease. No formal obstruction on PFT. Clinical improvement with ICS/LABA therapy. Minimal use of SABA. Action plan in place. - Continue Symbicort  2 puffs Twice daily. Brush tongue and rinse mouth afterwards - Continue PRN albuterol      Advised if symptoms do not improve or worsen, to please contact office for sooner follow up or seek emergency care.   I spent 35 minutes of dedicated to the care of this patient on the date of this encounter to include pre-visit review of records, face-to-face time with the patient discussing conditions above, post visit ordering of testing, clinical documentation with the electronic health record, making appropriate referrals as documented, and communicating necessary findings to members of the patients care team.  Comer LULLA Rouleau, NP 10/18/2023  Pt aware and understands NP's role.

## 2023-10-18 NOTE — Patient Instructions (Signed)
 Full PFT performed today.

## 2023-10-18 NOTE — Patient Instructions (Addendum)
 Continue Symbicort  2 puffs Twice daily. Brush tongue and rinse mouth afterwards Continue albuterol  inhaler 2 puffs every 6 hours as needed for shortness of breath or wheezing. Notify if symptoms persist despite rescue inhaler/neb use.  Continue xyzal  1 tab daily Continue singulair  1 tab daily   Start flonase  nasal spray 2 sprays each nostril daily for nasal congestion/postnasal drainage    Attend CT chest in October as scheduled    Follow up in 7 weeks after HRCT with Dr. Theophilus or Katie Chetan Mehring,NP. If symptoms do not improve or worsen, please contact office for sooner follow up or seek emergency care.

## 2023-10-24 ENCOUNTER — Other Ambulatory Visit (HOSPITAL_BASED_OUTPATIENT_CLINIC_OR_DEPARTMENT_OTHER): Payer: Self-pay

## 2023-10-24 DIAGNOSIS — H04123 Dry eye syndrome of bilateral lacrimal glands: Secondary | ICD-10-CM | POA: Diagnosis not present

## 2023-10-24 DIAGNOSIS — H35363 Drusen (degenerative) of macula, bilateral: Secondary | ICD-10-CM | POA: Diagnosis not present

## 2023-10-24 DIAGNOSIS — H40051 Ocular hypertension, right eye: Secondary | ICD-10-CM | POA: Diagnosis not present

## 2023-10-24 DIAGNOSIS — H35033 Hypertensive retinopathy, bilateral: Secondary | ICD-10-CM | POA: Diagnosis not present

## 2023-10-24 MED ORDER — FLUZONE HIGH-DOSE 0.5 ML IM SUSY
0.5000 mL | PREFILLED_SYRINGE | Freq: Once | INTRAMUSCULAR | 0 refills | Status: AC
Start: 2023-10-24 — End: 2023-10-25
  Filled 2023-10-24: qty 0.5, 1d supply, fill #0

## 2023-11-06 ENCOUNTER — Other Ambulatory Visit (HOSPITAL_BASED_OUTPATIENT_CLINIC_OR_DEPARTMENT_OTHER): Payer: Self-pay

## 2023-11-06 MED ORDER — COMIRNATY 30 MCG/0.3ML IM SUSY
0.3000 mL | PREFILLED_SYRINGE | Freq: Once | INTRAMUSCULAR | 0 refills | Status: AC
  Filled 2023-11-06: qty 0.3, 1d supply, fill #0

## 2023-11-10 ENCOUNTER — Other Ambulatory Visit: Payer: Self-pay | Admitting: Nurse Practitioner

## 2023-11-10 DIAGNOSIS — R0609 Other forms of dyspnea: Secondary | ICD-10-CM

## 2023-11-20 ENCOUNTER — Ambulatory Visit
Admission: RE | Admit: 2023-11-20 | Discharge: 2023-11-20 | Disposition: A | Source: Ambulatory Visit | Attending: Nurse Practitioner

## 2023-11-20 DIAGNOSIS — R0609 Other forms of dyspnea: Secondary | ICD-10-CM

## 2023-11-20 DIAGNOSIS — J479 Bronchiectasis, uncomplicated: Secondary | ICD-10-CM | POA: Diagnosis not present

## 2023-11-20 DIAGNOSIS — M069 Rheumatoid arthritis, unspecified: Secondary | ICD-10-CM

## 2023-11-21 DIAGNOSIS — Z6831 Body mass index (BMI) 31.0-31.9, adult: Secondary | ICD-10-CM | POA: Diagnosis not present

## 2023-11-21 DIAGNOSIS — R55 Syncope and collapse: Secondary | ICD-10-CM | POA: Diagnosis not present

## 2023-11-21 DIAGNOSIS — R7303 Prediabetes: Secondary | ICD-10-CM | POA: Diagnosis not present

## 2023-11-21 DIAGNOSIS — N390 Urinary tract infection, site not specified: Secondary | ICD-10-CM | POA: Diagnosis not present

## 2023-11-21 DIAGNOSIS — L409 Psoriasis, unspecified: Secondary | ICD-10-CM | POA: Diagnosis not present

## 2023-11-21 DIAGNOSIS — N184 Chronic kidney disease, stage 4 (severe): Secondary | ICD-10-CM | POA: Diagnosis not present

## 2023-11-21 DIAGNOSIS — I10 Essential (primary) hypertension: Secondary | ICD-10-CM | POA: Diagnosis not present

## 2023-11-21 DIAGNOSIS — I129 Hypertensive chronic kidney disease with stage 1 through stage 4 chronic kidney disease, or unspecified chronic kidney disease: Secondary | ICD-10-CM | POA: Diagnosis not present

## 2023-11-21 DIAGNOSIS — N189 Chronic kidney disease, unspecified: Secondary | ICD-10-CM | POA: Diagnosis not present

## 2023-11-28 ENCOUNTER — Ambulatory Visit: Payer: Self-pay | Admitting: Nurse Practitioner

## 2023-11-28 NOTE — Progress Notes (Signed)
 ATCx1, VM box was full so unable to LVM. Sending MyChart message.

## 2023-11-28 NOTE — Progress Notes (Signed)
 No ILD related to her RA. Some plaque buildup in the arteries which she can follow with her PCP. Mild btx, which is enlarged, inflamed airways. Some scattered scarring and air trapping. No dedicated follow up necessary and reassuring scan. Can discuss further at f/u with Dr. Theophilus. Thanks.

## 2023-11-29 DIAGNOSIS — M0589 Other rheumatoid arthritis with rheumatoid factor of multiple sites: Secondary | ICD-10-CM | POA: Diagnosis not present

## 2023-11-29 DIAGNOSIS — R0602 Shortness of breath: Secondary | ICD-10-CM | POA: Diagnosis not present

## 2023-11-29 DIAGNOSIS — M25512 Pain in left shoulder: Secondary | ICD-10-CM | POA: Diagnosis not present

## 2023-11-29 DIAGNOSIS — I82890 Acute embolism and thrombosis of other specified veins: Secondary | ICD-10-CM | POA: Diagnosis not present

## 2023-11-29 DIAGNOSIS — E669 Obesity, unspecified: Secondary | ICD-10-CM | POA: Diagnosis not present

## 2023-11-29 DIAGNOSIS — Z6831 Body mass index (BMI) 31.0-31.9, adult: Secondary | ICD-10-CM | POA: Diagnosis not present

## 2023-11-29 DIAGNOSIS — M5136 Other intervertebral disc degeneration, lumbar region with discogenic back pain only: Secondary | ICD-10-CM | POA: Diagnosis not present

## 2023-11-29 DIAGNOSIS — L401 Generalized pustular psoriasis: Secondary | ICD-10-CM | POA: Diagnosis not present

## 2023-11-29 DIAGNOSIS — M1991 Primary osteoarthritis, unspecified site: Secondary | ICD-10-CM | POA: Diagnosis not present

## 2023-11-29 DIAGNOSIS — L4059 Other psoriatic arthropathy: Secondary | ICD-10-CM | POA: Diagnosis not present

## 2023-12-09 DIAGNOSIS — N189 Chronic kidney disease, unspecified: Secondary | ICD-10-CM | POA: Diagnosis not present

## 2023-12-09 DIAGNOSIS — D649 Anemia, unspecified: Secondary | ICD-10-CM | POA: Diagnosis not present

## 2023-12-09 DIAGNOSIS — I129 Hypertensive chronic kidney disease with stage 1 through stage 4 chronic kidney disease, or unspecified chronic kidney disease: Secondary | ICD-10-CM | POA: Diagnosis not present

## 2023-12-11 DIAGNOSIS — H35351 Cystoid macular degeneration, right eye: Secondary | ICD-10-CM | POA: Diagnosis not present

## 2023-12-11 DIAGNOSIS — H2512 Age-related nuclear cataract, left eye: Secondary | ICD-10-CM | POA: Diagnosis not present

## 2023-12-11 DIAGNOSIS — T8529XD Other mechanical complication of intraocular lens, subsequent encounter: Secondary | ICD-10-CM | POA: Diagnosis not present

## 2023-12-11 DIAGNOSIS — H04123 Dry eye syndrome of bilateral lacrimal glands: Secondary | ICD-10-CM | POA: Diagnosis not present

## 2023-12-11 DIAGNOSIS — H18231 Secondary corneal edema, right eye: Secondary | ICD-10-CM | POA: Diagnosis not present

## 2023-12-25 ENCOUNTER — Encounter: Payer: Self-pay | Admitting: Pulmonary Disease

## 2023-12-25 ENCOUNTER — Ambulatory Visit (INDEPENDENT_AMBULATORY_CARE_PROVIDER_SITE_OTHER): Admitting: Pulmonary Disease

## 2023-12-25 VITALS — BP 121/74 | HR 66 | Temp 97.5°F | Ht 66.0 in | Wt 197.0 lb

## 2023-12-25 DIAGNOSIS — R0609 Other forms of dyspnea: Secondary | ICD-10-CM

## 2023-12-25 DIAGNOSIS — J4540 Moderate persistent asthma, uncomplicated: Secondary | ICD-10-CM

## 2023-12-25 DIAGNOSIS — M069 Rheumatoid arthritis, unspecified: Secondary | ICD-10-CM | POA: Diagnosis not present

## 2023-12-25 NOTE — Progress Notes (Signed)
 Cheyenne Gould    [id number]    08/29/1945  Primary Care Physician:Bland, Kennieth, MD  Referring Physician: Benjamine Kennieth, MD [address] [place],  [place] [address]  Chief complaint: Follow up for asthma, dyspnea.  HPI: 78 y.o. with history of rheumatoid, psoriatic arthritis, pustular psoriasis, GERD, hypertension.  Evaluated in October 2020 for worsening dyspnea.  She had a high-res CT which showed mild centrilobular nodularity suggestive of hypersensitivity pneumonitis.  However ILD questionnare does not reveal any significant exposures.  Follow-up imaging in showed improvement in nodularity.  She follows with Dr. Mai for arthritis.  She had been previously treated with Arava  which had to be stopped due to side effects and Remicade  which had to be stopped after she developed sepsis and pneumonia.  She also had been tried on Otesla, stelara  with no efficacy, then tried Simponi  which was ineffective. Tried on Taltz injections from September to November 2021 but had to stop due to allergic reaction.  She is currently on Cimzia  and methotrexate  She was evaluated by Dr. Ladona had an echo and stress test which are normal as per the patient   She has history of GERD and is on Prilosec.  Notices that dyspnea is better when she stops the Prilosec however has recurrence of heartburn when off Prilosec.  Has had a 30 pound weight gain over the past 1 year but feels that her dyspnea is not related to the weight gain.  Taken off methotrexate earlier in 2022 by Dr. Mai due to mild hypersensitivity pneumonitis and is currently on Rinvoq.   She stopped using the inhalers and Singulair  in 2022 with no worsening of her breathing    Interim history: Received note from Dr. Lynwood Mai, rheumatology dated 07/23/2023 She is being followed for inflammatory arthritis.  Rheumatoid arthritis, psoriatic arthritis overlap.  Initially diagnosed as rheumatoid arthritis with positive CCP.  Also  developed psoriasis and psoriatic arthritis in 2017.  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  Developed superficial vein thrombosis in 2024 while on Rinvoq but okay to continue current therapy.  Discussed the use of AI scribe software for clinical note transcription with the patient, who gave verbal consent to proceed.  History of Present Illness [name] is a 78 year old female with asthma who presents with improved breathing after resuming Symbicort  inhaler therapy. She was previously seen by Izetta Rouleau earlier this year when her symptoms were more pronounced.  Dyspnea and wheezing - Significant improvement in breathing since resuming inhaler therapy (Symbicort ) - Previously experienced increased shortness of breath and wheezing earlier this year, especially with exertion such as walking and climbing stairs - Current symptoms of dyspnea and wheezing have subsided with inhaler use  Inhaler therapy adherence and response - Currently using Symbicort , two puffs in the morning and two puffs in the evening - Improvement in respiratory symptoms correlated with resumption of inhaler therapy   Relevant Pulmonary History Pets: No pets Occupation: Used to work in a educational psychologist.  Used to work at a [employer] for a year in the 1970s.  Now is a medical sales representative for [employer] Exposure questionnaire 01/21/19: No known exposures, no mold, hot tubs, Jacuzzi Smoking history: 40-pack-year smoking history.  Quit in 2005 Travel History: Lived in [place]  for 5 years otherwise was a resident of [place] .  No significant travel.  Outpatient Encounter Medications  as of 12/25/2023  Medication Sig   acetaminophen  (TYLENOL ) 500 MG tablet Take 1,000 mg by mouth every 6 (six) hours as needed for mild pain or headache.   albuterol  (VENTOLIN  HFA) 108 (90 Base) MCG/ACT inhaler TAKE 2 PUFFS BY MOUTH  EVERY 6 HOURS AS NEEDED FOR WHEEZE OR SHORTNESS OF BREATH   amLODipine  (NORVASC ) 5 MG tablet Take 5 mg by mouth daily.   budesonide -formoterol  (SYMBICORT ) 160-4.5 MCG/ACT inhaler Inhale 2 puffs into the lungs 2 (two) times daily.   Cholecalciferol (VITAMIN D3) 2000 units TABS Take 2,000 Units by mouth daily.   EPIPEN 2-PAK 0.3 MG/0.3ML SOAJ injection Inject 0.3 mg into the muscle as needed for anaphylaxis.   ezetimibe  (ZETIA ) 10 MG tablet TAKE 1 TABLET BY MOUTH EVERY DAY   fluticasone  (FLONASE ) 50 MCG/ACT nasal spray SPRAY 2 SPRAYS INTO EACH NOSTRIL EVERY DAY   folic acid  (FOLVITE ) 1 MG tablet Take 2 mg by mouth daily.   lansoprazole (PREVACID) 15 MG capsule Take 15 mg by mouth daily at 12 noon.   loratadine  (CLARITIN ) 10 MG tablet Take 10 mg by mouth daily.   metoprolol  succinate (TOPROL -XL) 50 MG 24 hr tablet TAKE 1 TABLET BY MOUTH EVERY DAY WITH OR IMMEDIATELY FOLLOWING A MEAL   montelukast  (SINGULAIR ) 10 MG tablet TAKE 1 TABLET BY MOUTH EVERYDAY AT BEDTIME   simvastatin  (ZOCOR ) 10 MG tablet TAKE 1 TABLET BY MOUTH EVERYDAY AT BEDTIME   Upadacitinib ER (RINVOQ) 15 MG TB24 1 tablet Orally Once a day for 30 day(s)   valACYclovir  (VALTREX ) 500 MG tablet Take 500 mg by mouth daily.   valsartan -hydrochlorothiazide  (DIOVAN -HCT) 160-12.5 MG tablet Take 0.5 tablets by mouth daily.   [facility] ASPIRIN  LOW DOSE 81 MG tablet TAKE 1 TABLET (81 MG TOTAL) BY MOUTH DAILY. SWALLOW WHOLE. (Patient not taking: Reported on 12/25/2023)   levocetirizine (XYZAL ) 5 MG tablet Take 5 mg by mouth at bedtime.  (Patient not taking: Reported on 12/25/2023)   PROLENSA 0.07 % SOLN Apply to eye.   XIIDRA 5 % SOLN Apply 1 drop to eye 2 (two) times daily.   No facility-administered encounter medications on file as of 12/25/2023.   Vitals:   12/25/23 1402  BP: 121/74  Pulse: 66  Temp: (!) 97.5 F (36.4 C)  Height: 5' 6 (1.676 m)  Weight: 197 lb (89.4 kg)  SpO2: 97%  TempSrc: Oral  BMI (Calculated): 31.81     Physical  Exam GEN: No acute distress CV: Regular rate and rhythm no murmurs LUNGS: Clear to auscultation bilaterally normal respiratory effort SKIN JOINTS: Warm and dry no rash    Data Reviewed: Imaging Chest x-ray 06/10/15-bilateral lung infiltrates Chest x-ray 06/25/15-resolution of right upper lobe infiltrate and left basilar pneumonia.  There is linear atelectasis at the right base with right diaphragm elevation. High-resolution CT 06/05/2017- mild atrophy, no evidence of interstitial lung disease.  Subcentimeter pulmonary nodule, coronary atherosclerosis.  Mild scarring in the right middle, lower lobes CT chest 09/25/2017- stable subcentimeter pulmonary nodules, coronary atherosclerosis. High-resolution CT 12/19/2018-mild centrilobular nodularity, coronary artery atherosclerosis, aortic atherosclerosis  CT chest 4/40/21-improvement in centrilobular nodularity.  High-resolution CT 03/11/2020-no interstitial lung disease, mild peripheral scarring in the periphery of the right lung base, aortic atherosclerosis, three-vessel coronary artery disease, nephrolithiasis.   High resolution CT 04/29/2022-minimal bland scarring which is unchanged, lobular air trapping, coronary artery disease.  High resolution CT 11/20/2023-no evidence of interstitial lung disease, mild atrophy and mild cylindrical bronchiectasis.  Mild bland scarring at the base. I reviewed the  images personally.   PFTs 06/28/2017 FVC 1.76 [7%), FEV1 1.58 [78%], F/F 90, TLC 69%, DLCO 54%, DLCO/VA 83%  04/05/2020 FVC 2.02 [80%], FEV1 1.79 [92%], F/F 89, TLC 3.45 [64%], DLCO 14.10 [68%] Moderate restriction and diffusion defect.  10/18/2023 FVC 1.86 [61%], FEV1 1.57 [69%], F/F86, TLC 3.71 [69%], DLCO 10.22 [50%] Mild-moderate restriction, severe diffusion defect  FENO 06/28/2017-9  ACT score 10/27/2021-24  Labs: CCP 01/21/2019-35,  Rheumatoid factor 01/21/19-25 ANA 01/21/2019-negative  CBC 01/21/2019-WBC 7, eos 4.7%, absolute eosinophil  count 329 CBC 02/09/2020-WBC 6.6, eos 9.2%, absolute eosinophil count 607 IgE 02/09/2020-365  Cardiac: Right and left heart catheterization 05/25/2020 No pulmonary hypertension, nonobstructive coronary artery disease   Assessment & Plan Asthma Exacerbation with previous shortness of breath and wheezing during exertion. Symptoms have improved with Symbicort . CT scan shows no interstitial lung disease, confirming asthma as the primary issue. - Continue Symbicort , two puffs in the morning and evening. - Will reassess in six months.  Concern for interstitial lung disease CT in the past reviewed with mild centrilobular nodularity of unclear etiology.  Imaging is suggestive of hypersensitivity pneumonitis but she does not have any exposure history and follow-up imaging showed some improvement with no evidence of ILD.  She was on methotrexate for rheumatoid arthritis which may have contributed and has been off this medication.  Continues on Rinvoq per Dr. Mai, rheumatology CT scan this year reviewed with no evidence of interstitial lung disease which is reassuring.   Plan/Recommendations: - Continue Symbicort   Lonna Coder MD [facility] 12/25/2023, 2:11 PM  CC: Benjamine Aland, MD

## 2023-12-25 NOTE — Patient Instructions (Signed)
  VISIT SUMMARY: Today, we discussed your asthma and how your symptoms have improved since resuming your inhaler therapy.  YOUR PLAN: ASTHMA: You experienced shortness of breath and wheezing earlier this year, especially with exertion. Your symptoms have improved since you resumed using your inhaler. -Continue using Symbicort , two puffs in the morning and two puffs in the evening. -We will reassess your condition in six months.                              Contains text generated by [employer].

## 2023-12-30 DIAGNOSIS — R1084 Generalized abdominal pain: Secondary | ICD-10-CM | POA: Diagnosis not present

## 2023-12-30 DIAGNOSIS — R140 Abdominal distension (gaseous): Secondary | ICD-10-CM | POA: Diagnosis not present

## 2023-12-30 DIAGNOSIS — R197 Diarrhea, unspecified: Secondary | ICD-10-CM | POA: Diagnosis not present

## 2024-01-10 DIAGNOSIS — Z1231 Encounter for screening mammogram for malignant neoplasm of breast: Secondary | ICD-10-CM | POA: Diagnosis not present

## 2024-02-03 DIAGNOSIS — R0781 Pleurodynia: Secondary | ICD-10-CM | POA: Diagnosis not present

## 2024-02-03 DIAGNOSIS — R197 Diarrhea, unspecified: Secondary | ICD-10-CM | POA: Diagnosis not present

## 2024-02-15 ENCOUNTER — Other Ambulatory Visit: Payer: Self-pay | Admitting: Nurse Practitioner

## 2024-02-15 DIAGNOSIS — R0609 Other forms of dyspnea: Secondary | ICD-10-CM

## 2024-03-10 ENCOUNTER — Other Ambulatory Visit: Payer: Self-pay | Admitting: Cardiology

## 2024-03-10 DIAGNOSIS — E785 Hyperlipidemia, unspecified: Secondary | ICD-10-CM

## 2024-03-10 DIAGNOSIS — I2510 Atherosclerotic heart disease of native coronary artery without angina pectoris: Secondary | ICD-10-CM

## 2024-03-17 NOTE — Telephone Encounter (Signed)
 In accordance with refill protocols, please review and address the following requirements before this medication refill can be authorized:  Labs
